# Patient Record
Sex: Male | Born: 1985 | Race: Black or African American | Hispanic: No | Marital: Married | Smoking: Never smoker
Health system: Southern US, Community
[De-identification: ages and names within clinical notes are randomized; demographics above are authoritative.]

## PROBLEM LIST (undated history)

## (undated) DIAGNOSIS — T4145XA Adverse effect of unspecified anesthetic, initial encounter: Secondary | ICD-10-CM

## (undated) DIAGNOSIS — G43909 Migraine, unspecified, not intractable, without status migrainosus: Secondary | ICD-10-CM

## (undated) DIAGNOSIS — F419 Anxiety disorder, unspecified: Secondary | ICD-10-CM

## (undated) DIAGNOSIS — I10 Essential (primary) hypertension: Secondary | ICD-10-CM

## (undated) DIAGNOSIS — T8859XA Other complications of anesthesia, initial encounter: Secondary | ICD-10-CM

## (undated) DIAGNOSIS — R569 Unspecified convulsions: Secondary | ICD-10-CM

## (undated) HISTORY — PX: KNEE SURGERY: SHX244

## (undated) HISTORY — DX: Anxiety disorder, unspecified: F41.9

## (undated) HISTORY — DX: Migraine, unspecified, not intractable, without status migrainosus: G43.909

---

## 2012-04-23 ENCOUNTER — Ambulatory Visit (INDEPENDENT_AMBULATORY_CARE_PROVIDER_SITE_OTHER): Payer: BC Managed Care – PPO | Admitting: Emergency Medicine

## 2012-04-23 ENCOUNTER — Ambulatory Visit: Payer: BC Managed Care – PPO

## 2012-04-23 VITALS — BP 136/87 | HR 106 | Temp 99.3°F | Resp 16 | Ht 71.5 in | Wt 272.0 lb

## 2012-04-23 DIAGNOSIS — M25579 Pain in unspecified ankle and joints of unspecified foot: Secondary | ICD-10-CM

## 2012-04-23 DIAGNOSIS — S93409A Sprain of unspecified ligament of unspecified ankle, initial encounter: Secondary | ICD-10-CM

## 2012-04-23 MED ORDER — HYDROCODONE-ACETAMINOPHEN 5-325 MG PO TABS: 1.0000 | ORAL_TABLET | ORAL | Status: DC | PRN

## 2012-04-23 NOTE — Patient Instructions (Signed)
Ankle Sprain  An ankle sprain is an injury to the strong, fibrous tissues (ligaments) that hold the bones of your ankle joint together.   CAUSES  An ankle sprain is usually caused by a fall or by twisting your ankle. Ankle sprains most commonly occur when you step on the outer edge of your foot, and your ankle turns inward. People who participate in sports are more prone to these types of injuries.   SYMPTOMS    Pain in your ankle. The pain may be present at rest or only when you are trying to stand or walk.   Swelling.   Bruising. Bruising may develop immediately or within 1 to 2 days after your injury.   Difficulty standing or walking, particularly when turning corners or changing directions.  DIAGNOSIS   Your caregiver will ask you details about your injury and perform a physical exam of your ankle to determine if you have an ankle sprain. During the physical exam, your caregiver will press on and apply pressure to specific areas of your foot and ankle. Your caregiver will try to move your ankle in certain ways. An X-ray exam may be done to be sure a bone was not broken or a ligament did not separate from one of the bones in your ankle (avulsion fracture).   TREATMENT   Certain types of braces can help stabilize your ankle. Your caregiver can make a recommendation for this. Your caregiver may recommend the use of medicine for pain. If your sprain is severe, your caregiver may refer you to a surgeon who helps to restore function to parts of your skeletal system (orthopedist) or a physical therapist.  HOME CARE INSTRUCTIONS    Apply ice to your injury for 1 to 2 days or as directed by your caregiver. Applying ice helps to reduce inflammation and pain.   Put ice in a plastic bag.   Place a towel between your skin and the bag.   Leave the ice on for 15 to 20 minutes at a time, every 2 hours while you are awake.   Only take over-the-counter or prescription medicines for pain, discomfort, or fever as directed  by your caregiver.   Keep your injured leg elevated, when possible, to lessen swelling.   If your caregiver recommends crutches, use them as instructed. Gradually put weight on the affected ankle. Continue to use crutches or a cane until you can walk without feeling pain in your ankle.   If you have a plaster splint, wear the splint as directed by your caregiver. Do not rest it on anything harder than a pillow for the first 24 hours. Do not put weight on it. Do not get it wet. You may take it off to take a shower or bath.   You may have been given an elastic bandage to wear around your ankle to provide support. If the elastic bandage is too tight (you have numbness or tingling in your foot or your foot becomes cold and blue), adjust the bandage to make it comfortable.   If you have an air splint, you may blow more air into it or let air out to make it more comfortable. You may take your splint off at night and before taking a shower or bath.   Wiggle your toes in the splint several times per day to decrease swelling.  SEEK MEDICAL CARE IF:    You have an increase in bruising, swelling, or pain.   Your toes feel extremely cold   or you lose feeling in your foot.   Your pain is not relieved with medicine.  SEEK IMMEDIATE MEDICAL CARE IF:   Your toes are numb or blue.   You have severe pain.  MAKE SURE YOU:    Understand these instructions.   Will watch your condition.   Will get help right away if you are not doing well or get worse.  Document Released: 02/24/2005 Document Revised: 05/19/2011 Document Reviewed: 03/08/2011  [employer] Patient Information 2013 [employer].

## 2012-04-23 NOTE — Progress Notes (Signed)
[facility] [address] [place] [address] [phone]  Date:  04/23/2012   Name:  Peter Barr   DOB:  08/05/1985   MRN:  [id number]  PCP:  No primary provider on file.    Chief Complaint: Ankle Injury and Ankle Pain   History of Present Illness:  Peter Barr is a 27 y.o. very pleasant male patient who presents with the following:  Injured when slipped on ice and twisted his left ankle.  Unable to bear weight.  Moderate swelling and tenderness mid foot.  No other complaint.  There is no problem list on file for this patient.   History reviewed. No pertinent past medical history.  History reviewed. No pertinent past surgical history.  History  Substance Use Topics  . Smoking status: Never Smoker   . Smokeless tobacco: Not on file  . Alcohol Use: No    Family History  Problem Relation Age of Onset  . Hypertension Father   . Diabetes Maternal Grandmother   . Stroke Maternal Grandmother     No Known Allergies  Medication list has been reviewed and updated.  No current outpatient prescriptions on file prior to visit.   No current facility-administered medications on file prior to visit.    Review of Systems:  As per HPI, otherwise negative.    Physical Examination: Filed Vitals:   04/23/12 1855  BP: 136/87  Pulse: 106  Temp: 99.3 F (37.4 C)  Resp: 16   Filed Vitals:   04/23/12 1855  Height: 5' 11.5" (1.816 m)  Weight: 272 lb (123.378 kg)   Body mass index is 37.41 kg/(m^2). Ideal Body Weight: Weight in (lb) to have BMI = 25: 181.4   GEN: WDWN, NAD, Non-toxic, Alert & Oriented x 3 HEENT: Atraumatic, Normocephalic.  Ears and Nose: No external deformity. EXTR: No clubbing/cyanosis/edema NEURO: Normal gait.  PSYCH: Normally interactive. Conversant. Not depressed or anxious appearing.  Calm demeanor.  Left ankle:  Tender mid foot.  No ecchymosis or deformity.  Moderate swelling  Assessment and Plan: Sprain  ankle Crutches Air cast vicodin   Carmelina Dane, MD  [facility] reading (PRIMARY) by  Dr. Dareen Piano.  Ankle;  No bony injury.  [facility] reading (PRIMARY) by  Dr. Dareen Piano.  Foot.  No bony injury.

## 2012-08-08 ENCOUNTER — Emergency Department (HOSPITAL_COMMUNITY)
Admission: EM | Admit: 2012-08-08 | Discharge: 2012-08-08 | Disposition: A | Discharge status: Home / Self Care | Payer: Self-pay | Attending: Emergency Medicine | Admitting: Emergency Medicine

## 2012-08-08 ENCOUNTER — Encounter (HOSPITAL_COMMUNITY): Payer: Self-pay | Admitting: Emergency Medicine

## 2012-08-08 ENCOUNTER — Ambulatory Visit: Payer: BC Managed Care – PPO

## 2012-08-08 DIAGNOSIS — M25579 Pain in unspecified ankle and joints of unspecified foot: Secondary | ICD-10-CM | POA: Insufficient documentation

## 2012-08-08 DIAGNOSIS — M25572 Pain in left ankle and joints of left foot: Secondary | ICD-10-CM

## 2012-08-08 DIAGNOSIS — G8929 Other chronic pain: Secondary | ICD-10-CM | POA: Insufficient documentation

## 2012-08-08 MED ORDER — MELOXICAM 7.5 MG PO TABS: 15.0000 mg | ORAL_TABLET | Freq: Every day | ORAL | Status: DC

## 2012-08-08 MED ORDER — MELOXICAM 15 MG PO TABS: 15.0000 mg | ORAL_TABLET | Freq: Every day | ORAL | Status: DC

## 2012-08-08 NOTE — ED Notes (Addendum)
C/o intermittent L ankle pain and swelling since tripping and falling in January.  Reports negative x-rays at that time but has continued to have pain.  Denies pain at present.  Taking ibuprofen with relief.

## 2012-08-08 NOTE — ED Provider Notes (Signed)
Medical screening examination/treatment/procedure(s) were performed by non-physician practitioner and as supervising physician I was immediately available for consultation/collaboration.   Ramez Arrona Y. Londyn Hotard, MD 08/08/12 1958 

## 2012-08-08 NOTE — ED Provider Notes (Signed)
History     CSN: [id number]  Arrival date & time 08/08/12  1731   First MD Initiated Contact with Patient 08/08/12 1805      Chief Complaint  Patient presents with  . Ankle Pain    (Consider location/radiation/quality/duration/timing/severity/associated sxs/prior treatment) HPI Comments: Patient presents with persistent left ankle pain that has been nonradiating, intermittent for the past 4 months since an injury sustained when he twisted his ankle while walking on ice. Patient was seen initially because he could not ambulate. X-rays were performed of his foot and ankle which were negative. Patient states that the pain is improved however he has significant pain especially in the mornings. He works for the [employer] office and is on his feet for multiple hours in a day. He's been using ibuprofen with some relief. He's been ambulatory. He denies numbness, weakness, or tingling. He denies coldness to the skin. He has a history of a meniscal tear but does not have a orthopedic surgeon in [place]. Patient noted worsening pain and swelling yesterday without new injury. Onset of symptoms acute. Course is intermittent.  Patient is a 27 y.o. male presenting with ankle pain. The history is provided by the patient.  Ankle Pain Associated symptoms: no back pain and no neck pain     History reviewed. No pertinent past medical history.  Past Surgical History  Procedure Laterality Date  . Knee surgery      Family History  Problem Relation Age of Onset  . Hypertension Father   . Diabetes Maternal Grandmother   . Stroke Maternal Grandmother     History  Substance Use Topics  . Smoking status: Never Smoker   . Smokeless tobacco: Not on file  . Alcohol Use: Yes      Review of Systems  Constitutional: Negative for activity change.  HENT: Negative for neck pain.   Musculoskeletal: Positive for arthralgias. Negative for back pain, joint swelling and gait problem.  Skin: Negative for  wound.  Neurological: Negative for weakness and numbness.    Allergies  Review of patient's allergies indicates no known allergies.  Home Medications   Current Outpatient Rx  Name  Route  Sig  Dispense  Refill  . ibuprofen (ADVIL,MOTRIN) 800 MG tablet   Oral   Take 800 mg by mouth every 8 (eight) hours as needed for pain.         . Multiple Vitamins-Minerals (MULTIVITAMIN PO)   Oral   Take 1 tablet by mouth daily.         . meloxicam (MOBIC) 15 MG tablet   Oral   Take 1 tablet (15 mg total) by mouth daily.   14 tablet   0     BP 118/67  Pulse 87  Temp(Src) 98.6 F (37 C) (Oral)  Resp 16  SpO2 98%  Physical Exam  Vitals reviewed. Constitutional: He appears well-developed and well-nourished.  HENT:  Head: Normocephalic and atraumatic.  Eyes: Conjunctivae are normal.  Neck: Normal range of motion. Neck supple.  Cardiovascular:  Pulses:      Dorsalis pedis pulses are 2+ on the right side, and 2+ on the left side.       Posterior tibial pulses are 2+ on the right side, and 2+ on the left side.  Pulmonary/Chest: No respiratory distress.  Musculoskeletal: He exhibits tenderness. He exhibits no edema.       Left hip: Normal.       Left knee: Normal.       Left ankle:  He exhibits normal range of motion and no swelling. Tenderness. Lateral malleolus tenderness found.       Left lower leg: Normal.       Left foot: He exhibits tenderness and bony tenderness. He exhibits normal range of motion and no swelling.       Feet:  He denies pain with palpation over the fibular head of the affected side. She denies pain in the hip of the affected side.  Neurological: He is alert.  Distal motor, sensation, and vascular intact.  Skin: Skin is warm and dry.  Psychiatric: He has a normal mood and affect.    ED Course  Procedures (including critical care time)  Labs Reviewed - No data to display No results found.   1. Ankle pain, left    7:06 PM Patient seen and  examined.    Vital signs reviewed and are as follows: Filed Vitals:   08/08/12 1850  BP: 118/67  Pulse: 87  Temp:   Resp: 16   Will rx meloxicam and refer to ortho for further evaluation.    Pt defers ASO.     MDM  Chronic ankle pain since injury 02/14. Ambulatory. Pain improved with NSAIDs. Ortho referral with [facility], NSAID indicated. LE is neurovascularly intact.         Renne Crigler, PA-C 08/08/12 1912

## 2012-12-12 ENCOUNTER — Encounter (HOSPITAL_COMMUNITY): Payer: Self-pay | Admitting: *Deleted

## 2012-12-12 ENCOUNTER — Emergency Department (HOSPITAL_COMMUNITY)
Admission: EM | Admit: 2012-12-12 | Discharge: 2012-12-12 | Disposition: A | Discharge status: Home / Self Care | Payer: Medicaid Other | Attending: Emergency Medicine | Admitting: Emergency Medicine

## 2012-12-12 DIAGNOSIS — Z87828 Personal history of other (healed) physical injury and trauma: Secondary | ICD-10-CM | POA: Insufficient documentation

## 2012-12-12 DIAGNOSIS — G8929 Other chronic pain: Secondary | ICD-10-CM

## 2012-12-12 DIAGNOSIS — M25579 Pain in unspecified ankle and joints of unspecified foot: Secondary | ICD-10-CM | POA: Insufficient documentation

## 2012-12-12 DIAGNOSIS — Z9889 Other specified postprocedural states: Secondary | ICD-10-CM | POA: Insufficient documentation

## 2012-12-12 DIAGNOSIS — M25559 Pain in unspecified hip: Secondary | ICD-10-CM | POA: Insufficient documentation

## 2012-12-12 MED ORDER — HYDROCODONE-ACETAMINOPHEN 5-325 MG PO TABS: 2.0000 | ORAL_TABLET | ORAL | Status: DC | PRN

## 2012-12-12 MED ORDER — NAPROXEN 500 MG PO TABS: 500.0000 mg | ORAL_TABLET | Freq: Two times a day (BID) | ORAL | Status: DC

## 2012-12-12 NOTE — ED Notes (Signed)
Reports hx of twisting left ankle in past and now having pain again, denies new injury to it. Ambulatory at triage. Also having recent right hip pain.

## 2012-12-12 NOTE — Progress Notes (Signed)
Orthopedic Tech Progress Note Patient Details:  Peter Barr 26-Jul-1985 [id number]  Ortho Devices Type of Ortho Device: ASO Ortho Device/Splint Interventions: Application   Cammer, Mickie Bail 12/12/2012, 12:43 PM

## 2012-12-12 NOTE — ED Notes (Signed)
Pms post splint application.

## 2012-12-12 NOTE — ED Provider Notes (Signed)
Medical screening examination/treatment/procedure(s) were performed by non-physician practitioner and as supervising physician I was immediately available for consultation/collaboration.  Flint Melter, MD 12/12/12 [phone]

## 2012-12-12 NOTE — ED Provider Notes (Signed)
CSN: [id number]     Arrival date & time 12/12/12  1106 History  This chart was scribed for Fayrene Helper, PA working with Flint Melter, MD by Quintella Reichert, ED Scribe. This patient was seen in room TR06C/TR06C and the patient's care was started at 12:03 PM.  Chief Complaint  Patient presents with  . Ankle Pain    The history is provided by the patient. No language interpreter was used.    HPI Comments: Peter Barr is a 27 y.o. male who presents to the Emergency Department complaining of left ankle pain.  Pt states that he twisted the ankle in February and was diagnosed with a minor sprain based on x-ray.  He states his pain never fully resolved and was aggravated in the past week.  Pain is moderate and aching.  It does not radiate.  It is worse in the morning and after spending a long time on his feet.  He denies numbness, swelling or rash.  He has attempted to treat pain with ice and ibuprofen, without relief.  He only elevates the ankle when sleeping.  Pt also mentions that yesterday he noticed a "pulling" sensation to the right hip when walking up steps.  This has since improved significantly.  He denies any specific traumas.   History reviewed. No pertinent past medical history.   Past Surgical History  Procedure Laterality Date  . Knee surgery      Family History  Problem Relation Age of Onset  . Hypertension Father   . Diabetes Maternal Grandmother   . Stroke Maternal Grandmother     History  Substance Use Topics  . Smoking status: Never Smoker   . Smokeless tobacco: Not on file  . Alcohol Use: Yes     Review of Systems  Musculoskeletal: Positive for arthralgias.     Allergies  Review of patient's allergies indicates no known allergies.  Home Medications   Current Outpatient Rx  Name  Route  Sig  Dispense  Refill  . acetaminophen (TYLENOL) 500 MG tablet   Oral   Take 1,000 mg by mouth every 6 (six) hours as needed for pain.         Marland Kitchen ibuprofen  (ADVIL,MOTRIN) 200 MG tablet   Oral   Take 800 mg by mouth every 6 (six) hours as needed for pain.         . Multiple Vitamins-Minerals (MULTIVITAMIN PO)   Oral   Take 1 tablet by mouth daily.          BP 137/82  Pulse 82  Temp(Src) 98.3 F (36.8 C) (Oral)  Resp 18  Wt 278 lb 4.8 oz (126.236 kg)  BMI 38.28 kg/m2  SpO2 96%  Physical Exam  Nursing note and vitals reviewed. Constitutional: He is oriented to person, place, and time. He appears well-developed and well-nourished. No distress.  HENT:  Head: Normocephalic and atraumatic.  Eyes: EOM are normal.  Neck: Neck supple. No tracheal deviation present.  Cardiovascular: Normal rate and intact distal pulses.   Pulmonary/Chest: Effort normal. No respiratory distress.  Musculoskeletal: Normal range of motion.  Normal dorsiflexion and plantarflexion to left ankle Tenderness with foot eversion and inversion Tenderness to medial malleolar region of left ankle.  Mild swelling noted. No obvious deformity, erythema or ash. Normal flexion and extension to right hip.  Neurological: He is alert and oriented to person, place, and time.  Skin: Skin is warm and dry.  Psychiatric: He has a normal mood and affect. His behavior  is normal.    ED Course  Procedures (including critical care time)  DIAGNOSTIC STUDIES: Oxygen Saturation is 96% on room air, normal by my interpretation.    COORDINATION OF CARE: 12:08 PM-Doubt septic arthritis, fracture, dislocation or gout.  Suspect symptoms are more likely related to arthritic changes.  Discussed treatment plan which includes ASO brace, RICE therapy, referral to orthopedist, and resource guide to find a PCP.  Pt expressed understanding and agreed with plan.   Labs Review Labs Reviewed - No data to display  Imaging Review No results found.  MDM   1. Chronic pain of left ankle    BP 137/82  Pulse 82  Temp(Src) 98.3 F (36.8 C) (Oral)  Resp 18  Wt 278 lb 4.8 oz (126.236 kg)   BMI 38.28 kg/m2  SpO2 96%   I personally performed the services described in this documentation, which was scribed in my presence. The recorded information has been reviewed and is accurate.     Fayrene Helper, PA-C 12/12/12 1217

## 2013-04-16 ENCOUNTER — Inpatient Hospital Stay (HOSPITAL_COMMUNITY)
Admission: EM | Admit: 2013-04-16 | Discharge: 2013-04-22 | DRG: 101 | Disposition: A | Discharge status: Home / Self Care | Payer: 59 | Attending: Internal Medicine | Admitting: Internal Medicine

## 2013-04-16 ENCOUNTER — Emergency Department (HOSPITAL_COMMUNITY): Payer: 59

## 2013-04-16 ENCOUNTER — Encounter (HOSPITAL_COMMUNITY): Payer: Self-pay | Admitting: Emergency Medicine

## 2013-04-16 DIAGNOSIS — M6282 Rhabdomyolysis: Secondary | ICD-10-CM | POA: Diagnosis present

## 2013-04-16 DIAGNOSIS — S01502A Unspecified open wound of oral cavity, initial encounter: Secondary | ICD-10-CM | POA: Diagnosis present

## 2013-04-16 DIAGNOSIS — E860 Dehydration: Secondary | ICD-10-CM | POA: Diagnosis present

## 2013-04-16 DIAGNOSIS — M542 Cervicalgia: Secondary | ICD-10-CM | POA: Diagnosis present

## 2013-04-16 DIAGNOSIS — R569 Unspecified convulsions: Secondary | ICD-10-CM | POA: Diagnosis present

## 2013-04-16 DIAGNOSIS — M549 Dorsalgia, unspecified: Secondary | ICD-10-CM | POA: Diagnosis present

## 2013-04-16 DIAGNOSIS — Z8249 Family history of ischemic heart disease and other diseases of the circulatory system: Secondary | ICD-10-CM

## 2013-04-16 DIAGNOSIS — Z833 Family history of diabetes mellitus: Secondary | ICD-10-CM | POA: Diagnosis not present

## 2013-04-16 DIAGNOSIS — Z823 Family history of stroke: Secondary | ICD-10-CM

## 2013-04-16 DIAGNOSIS — I10 Essential (primary) hypertension: Secondary | ICD-10-CM | POA: Diagnosis present

## 2013-04-16 DIAGNOSIS — G40909 Epilepsy, unspecified, not intractable, without status epilepticus: Secondary | ICD-10-CM | POA: Diagnosis present

## 2013-04-16 DIAGNOSIS — S93409A Sprain of unspecified ligament of unspecified ankle, initial encounter: Secondary | ICD-10-CM | POA: Diagnosis present

## 2013-04-16 DIAGNOSIS — Y92009 Unspecified place in unspecified non-institutional (private) residence as the place of occurrence of the external cause: Secondary | ICD-10-CM

## 2013-04-16 DIAGNOSIS — W19XXXA Unspecified fall, initial encounter: Secondary | ICD-10-CM | POA: Diagnosis present

## 2013-04-16 HISTORY — DX: Essential (primary) hypertension: I10

## 2013-04-16 LAB — CBC
HEMATOCRIT: 44.8 % (ref 39.0–52.0)
Hemoglobin: 15.4 g/dL (ref 13.0–17.0)
MCH: 27.7 pg (ref 26.0–34.0)
MCHC: 34.4 g/dL (ref 30.0–36.0)
MCV: 80.7 fL (ref 78.0–100.0)
Platelets: 230 10*3/uL (ref 150–400)
RBC: 5.55 MIL/uL (ref 4.22–5.81)
RDW: 13.5 % (ref 11.5–15.5)
WBC: 12.2 10*3/uL — ABNORMAL HIGH (ref 4.0–10.5)

## 2013-04-16 LAB — BASIC METABOLIC PANEL
BUN: 15 mg/dL (ref 6–23)
CO2: 23 meq/L (ref 19–32)
Calcium: 9.4 mg/dL (ref 8.4–10.5)
Chloride: 100 mEq/L (ref 96–112)
Creatinine, Ser: 1.06 mg/dL (ref 0.50–1.35)
GFR calc Af Amer: >90 mL/min (ref >90)
Glucose, Bld: 115 mg/dL — ABNORMAL HIGH (ref 70–99)
POTASSIUM: 3.9 meq/L (ref 3.7–5.3)
SODIUM: 138 meq/L (ref 137–147)

## 2013-04-16 LAB — URINALYSIS, ROUTINE W REFLEX MICROSCOPIC
BILIRUBIN URINE: NEGATIVE
Glucose, UA: NEGATIVE mg/dL
Hgb urine dipstick: NEGATIVE
Ketones, ur: NEGATIVE mg/dL
Leukocytes, UA: NEGATIVE
NITRITE: NEGATIVE
PROTEIN: NEGATIVE mg/dL
Specific Gravity, Urine: 1.022 (ref 1.005–1.030)
UROBILINOGEN UA: 0.2 mg/dL (ref 0.0–1.0)
pH: 5.5 (ref 5.0–8.0)

## 2013-04-16 LAB — MRSA PCR SCREENING: MRSA by PCR: NEGATIVE

## 2013-04-16 LAB — GLUCOSE, CAPILLARY: GLUCOSE-CAPILLARY: 116 mg/dL — AB (ref 70–99)

## 2013-04-16 MED ORDER — PHENYTOIN SODIUM EXTENDED 100 MG PO CAPS
100.0000 mg | ORAL_CAPSULE | ORAL | Status: DC
  Administered 2013-04-16 – 2013-04-20 (×11): 100 mg via ORAL
  Filled 2013-04-16 (×14): qty 1

## 2013-04-16 MED ORDER — ACETAMINOPHEN 650 MG RE SUPP: 650.0000 mg | Freq: Four times a day (QID) | RECTAL | Status: DC | PRN

## 2013-04-16 MED ORDER — PHENYTOIN SODIUM EXTENDED 100 MG PO CAPS
100.0000 mg | ORAL_CAPSULE | Freq: Three times a day (TID) | ORAL | Status: DC
  Filled 2013-04-16: qty 1

## 2013-04-16 MED ORDER — ONDANSETRON HCL 4 MG/2ML IJ SOLN: 4.0000 mg | Freq: Four times a day (QID) | INTRAMUSCULAR | Status: DC | PRN

## 2013-04-16 MED ORDER — SODIUM CHLORIDE 0.9 % IV SOLN
INTRAVENOUS | Status: DC
  Administered 2013-04-16 – 2013-04-17 (×2): via INTRAVENOUS
  Administered 2013-04-18: 1000 mL via INTRAVENOUS
  Administered 2013-04-19: 18:00:00 via INTRAVENOUS

## 2013-04-16 MED ORDER — ACETAMINOPHEN 325 MG PO TABS
650.0000 mg | ORAL_TABLET | Freq: Four times a day (QID) | ORAL | Status: DC | PRN
  Administered 2013-04-20: 650 mg via ORAL
  Filled 2013-04-16: qty 2

## 2013-04-16 MED ORDER — LORAZEPAM 2 MG/ML IJ SOLN
INTRAMUSCULAR | Status: AC
  Filled 2013-04-16: qty 1

## 2013-04-16 MED ORDER — HYDROCODONE-ACETAMINOPHEN 5-325 MG PO TABS
1.0000 | ORAL_TABLET | ORAL | Status: DC | PRN
  Administered 2013-04-16 – 2013-04-17 (×3): 1 via ORAL
  Administered 2013-04-17 – 2013-04-20 (×6): 2 via ORAL
  Filled 2013-04-16: qty 2
  Filled 2013-04-16: qty 1
  Filled 2013-04-16 (×2): qty 2
  Filled 2013-04-16 (×2): qty 1
  Filled 2013-04-16 (×4): qty 2

## 2013-04-16 MED ORDER — SODIUM CHLORIDE 0.9 % IV SOLN: INTRAVENOUS | Status: DC

## 2013-04-16 MED ORDER — ONDANSETRON HCL 4 MG PO TABS: 4.0000 mg | ORAL_TABLET | Freq: Four times a day (QID) | ORAL | Status: DC | PRN

## 2013-04-16 MED ORDER — SODIUM CHLORIDE 0.9 % IV SOLN
10.0000 mg/kg | Freq: Once | INTRAVENOUS | Status: AC
  Administered 2013-04-16: 1270 mg via INTRAVENOUS
  Filled 2013-04-16: qty 25.4

## 2013-04-16 MED ORDER — HYDROMORPHONE HCL PF 1 MG/ML IJ SOLN
1.0000 mg | INTRAMUSCULAR | Status: DC | PRN
  Administered 2013-04-16 – 2013-04-17 (×2): 1 mg via INTRAVENOUS
  Filled 2013-04-16 (×2): qty 1

## 2013-04-16 MED ORDER — LORAZEPAM 2 MG/ML IJ SOLN: 2.0000 mg | INTRAMUSCULAR | Status: DC | PRN

## 2013-04-16 NOTE — ED Provider Notes (Signed)
CSN: [id number]     Arrival date & time 04/16/13  1056 History   First MD Initiated Contact with Patient 04/16/13 1139     Chief Complaint  Patient presents with  . Seizures   (Consider location/radiation/quality/duration/timing/severity/associated sxs/prior Treatment) Patient is a 28 y.o. male presenting with seizures. The history is provided by the patient, the spouse and the EMS personnel.  Seizures  He was at home, talking to his wife in the kitchen when he suddenly tensed up and fell to the right side. He then began shaking and hitting his head on a gate. He did this for 3-5 minutes. He, then stop shaking and was not responsive and having sonorous respirations. EMS was called and arrived and transferred him here. During transport he was postictal. On arrival here he had begun to wake up.  His wife arrived after he had been here about one hour. She states that he had been having ankle pain and using ibuprofen and tramadol, for it. There are no other recent illnesses or abnormalities, known.  Level V caveat- confusion  History reviewed. No pertinent past medical history. Past Surgical History  Procedure Laterality Date  . Knee surgery     Family History  Problem Relation Age of Onset  . Hypertension Father   . Diabetes Maternal Grandmother   . Stroke Maternal Grandmother    History  Substance Use Topics  . Smoking status: Never Smoker   . Smokeless tobacco: Not on file  . Alcohol Use: Yes    Review of Systems  Unable to perform ROS Neurological: Positive for seizures.    Allergies  Review of patient's allergies indicates no known allergies.  Home Medications   Current Outpatient Rx  Name  Route  Sig  Dispense  Refill  . ibuprofen (ADVIL,MOTRIN) 800 MG tablet   Oral   Take 800 mg by mouth every 8 (eight) hours as needed (pain).         Marland Kitchen. OVER THE COUNTER MEDICATION   Oral   Take 2 tablets by mouth daily. Joint Gummies         . traMADol (ULTRAM) 50 MG  tablet   Oral   Take 50 mg by mouth every 12 (twelve) hours as needed for moderate pain.          BP 126/75  Pulse 106  Temp(Src) 98.4 F (36.9 C) (Oral)  Resp 33  Ht 5\' 11"  (1.803 m)  Wt 280 lb (127.007 kg)  BMI 39.07 kg/m2  SpO2 98% Physical Exam  Nursing note and vitals reviewed. Constitutional: He appears well-developed and well-nourished.  HENT:  Head: Normocephalic.  Right Ear: External ear normal.  Left Ear: External ear normal.  Abrasion anterior tongue  Eyes: Conjunctivae and EOM are normal. Pupils are equal, round, and reactive to light.  Neck: Normal range of motion and phonation normal. Neck supple.  Cardiovascular: Normal rate, regular rhythm, normal heart sounds and intact distal pulses.   Pulmonary/Chest: Effort normal and breath sounds normal. He exhibits no bony tenderness.  Abdominal: Soft. Normal appearance. There is no tenderness.  Musculoskeletal: Normal range of motion.  Diffuse posterior neck tenderness. No palpable spine step off. No limitation of motion of arms or legs, bilaterally.  Neurological: He is alert. No cranial nerve deficit or sensory deficit. He exhibits normal muscle tone. Coordination normal.  Oriented only to person  Skin: Skin is warm, dry and intact.  Psychiatric: He has a normal mood and affect. His behavior is normal.  ED Course  Procedures (including critical care time)  Medications  LORazepam (ATIVAN) 2 MG/ML injection (not administered)  fosPHENYtoin (CEREBYX) 1,270 mg PE in sodium chloride 0.9 % 50 mL IVPB (0 mg PE/kg  127 kg Intravenous Stopped 04/16/13 1503)    Patient Vitals for the past 24 hrs:  BP Temp Temp src Pulse Resp SpO2 Height Weight  04/16/13 1500 126/75 mmHg - - 106 33 98 % - -  04/16/13 1446 125/74 mmHg - - 110 37 99 % - -  04/16/13 1430 143/64 mmHg - - 113 30 96 % - -  04/16/13 1415 143/64 mmHg - - 117 28 97 % - -  04/16/13 1400 160/77 mmHg - - 113 26 97 % - 280 lb (127.007 kg)  04/16/13 1349 161/96  mmHg - - 101 34 88 % - -  04/16/13 1330 138/78 mmHg - - 81 23 97 % - -  04/16/13 1300 141/85 mmHg - - 82 22 98 % - -  04/16/13 1230 154/84 mmHg - - 78 21 100 % - -  04/16/13 1223 143/81 mmHg - - 83 21 100 % - -  04/16/13 1200 146/91 mmHg - - 76 18 100 % - -  04/16/13 1112 145/97 mmHg - - 93 20 100 % - -  04/16/13 1100 158/73 mmHg 98.4 F (36.9 C) Oral 95 - 97 % 5\' 11"  (1.803 m) -  04/16/13 1057 150/73 mmHg - - 96 18 96 % - -  04/16/13 1055 - - - - - 99 % - -    1345 PM Reevaluation with update and discussion. After initial assessment and treatment, an updated evaluation reveals shortly after returning from CT imaging, he had a generalized tonic-clonic seizure followed by post-ictal state. Cerebyx, ordered for recurrent seizure. Gay Moncivais L   3:07 PM Reevaluation with update and discussion. After initial assessment and treatment, an updated evaluation reveals he is beginning to wake up. It is still sedated. His wife has determined that he has only used 5 out of his prescription of 60 tramadol pills, and feels like he has not had any within the last month. Kimara Bencomo L   15:12- I discussed the case with the neuro-hospitalist- will evaluate the patient and see him as a Research scientist (medical)  3:14 PM-Consult complete with Hospitalist. Patient case explained and discussed. She agrees to admit patient for further evaluation and treatment. Call ended at 1520  16:30- patient is now alert and lucid and able to operate with evaluation of neck. Cervical collar removed. This did not aggravate his discomfort. He complains of pain, bilateral neck in the areas of the sternocleidomastoid muscles. He has mild pain posterior in the neck that does not change with attempts at movement, including lateral bending and rotation. His neck motion is somewhat limited, but that is not consistent; i.e., he moves more at some times and less at others.  Cervical collar left off. There is no indication for further imaging of the  neck at this time.  CRITICAL CARE Performed by: Flint Melter Total critical care time:50 minutes Critical care time was exclusive of separately billable procedures and treating other patients. Critical care was necessary to treat or prevent imminent or life-threatening deterioration. Critical care was time spent personally by me on the following activities: development of treatment plan with patient and/or surrogate as well as nursing, discussions with consultants, evaluation of patient's response to treatment, examination of patient, obtaining history from patient or surrogate, ordering and performing treatments and interventions, ordering and  review of laboratory studies, ordering and review of radiographic studies, pulse oximetry and re-evaluation of patient's condition.  Labs Review Labs Reviewed  BASIC METABOLIC PANEL - Abnormal; Notable for the following:    Glucose, Bld 115 (*)    All other components within normal limits  CBC - Abnormal; Notable for the following:    WBC 12.2 (*)    All other components within normal limits  GLUCOSE, CAPILLARY - Abnormal; Notable for the following:    Glucose-Capillary 116 (*)    All other components within normal limits   Imaging Review Ct Head Wo Contrast  04/16/2013   CLINICAL DATA:  Fall status post seizure  EXAM: CT HEAD WITHOUT CONTRAST  CT CERVICAL SPINE WITHOUT CONTRAST  TECHNIQUE: Multidetector CT imaging of the head and cervical spine was performed following the standard protocol without intravenous contrast. Multiplanar CT image reconstructions of the cervical spine were also generated.  COMPARISON:  None.  FINDINGS: CT HEAD FINDINGS  No acute intracranial abnormality. Specifically, no hemorrhage, hydrocephalus, mass lesion, acute infarction, or significant intracranial injury. No acute calvarial abnormality. Visualized paranasal sinuses and mastoid air cells are patent.  CT CERVICAL SPINE FINDINGS  There is straightening with minimal  reversal of the normal cervical lordosis. This may represent collar placement, muscle spasm and/or positioning. Disk spaces are normal and there is no significant disk degeneration. No spondylosis is identified and there is no spinal or foraminal stenosis. There is no prevertebral soft tissue thickening.  No fracture is identified in the cervical spine. No mass lesion is present.  IMPRESSION: 1. No evidence of focal acute intracranial abnormalities 2. Mild reversal of the normal cervical lordosis may reflect muscle spasm, collar placement, or positioning otherwise unremarkable CT of the cervical spine.   Electronically Signed   By: Salome Holmes M.D.   On: 04/16/2013 12:33   Ct Cervical Spine Wo Contrast  04/16/2013   CLINICAL DATA:  Fall status post seizure  EXAM: CT HEAD WITHOUT CONTRAST  CT CERVICAL SPINE WITHOUT CONTRAST  TECHNIQUE: Multidetector CT imaging of the head and cervical spine was performed following the standard protocol without intravenous contrast. Multiplanar CT image reconstructions of the cervical spine were also generated.  COMPARISON:  None.  FINDINGS: CT HEAD FINDINGS  No acute intracranial abnormality. Specifically, no hemorrhage, hydrocephalus, mass lesion, acute infarction, or significant intracranial injury. No acute calvarial abnormality. Visualized paranasal sinuses and mastoid air cells are patent.  CT CERVICAL SPINE FINDINGS  There is straightening with minimal reversal of the normal cervical lordosis. This may represent collar placement, muscle spasm and/or positioning. Disk spaces are normal and there is no significant disk degeneration. No spondylosis is identified and there is no spinal or foraminal stenosis. There is no prevertebral soft tissue thickening.  No fracture is identified in the cervical spine. No mass lesion is present.  IMPRESSION: 1. No evidence of focal acute intracranial abnormalities 2. Mild reversal of the normal cervical lordosis may reflect muscle spasm,  collar placement, or positioning otherwise unremarkable CT of the cervical spine.   Electronically Signed   By: Salome Holmes M.D.   On: 04/16/2013 12:33   Dg Chest Port 1 View  04/16/2013   CLINICAL DATA:  Seizure.  Chest pain.  EXAM: PORTABLE CHEST - 1 VIEW  COMPARISON:  None.  FINDINGS: Portable view of the chest demonstrates elevation of the right hemidiaphragm. Probable atelectasis at the right lung base. Otherwise, the lungs are clear. Heart and mediastinum are within normal limits. Trachea is midline.  IMPRESSION: Elevation of the right hemidiaphragm with volume loss. No focal airspace disease.   Electronically Signed   By: Richarda Overlie M.D.   On: 04/16/2013 12:17    EKG Interpretation    Date/Time:  Saturday April 16 2013 10:59:00 EST Ventricular Rate:  96 PR Interval:  147 QRS Duration: 93 QT Interval:  359 QTC Calculation: 454 R Axis:   25 Text Interpretation:  Sinus rhythm No old tracing to compare Confirmed by [facility]  MD, Bethany Cumming ([id number]) on 04/16/2013 2:06:49 PM            MDM   1. Seizure disorder   2. Neck pain      New-onset seizure disorder, with 2 seizures today. He is somewhat sleep deprived, but does not have chronic insomnia. There are no significant injuries. He has neck pain, related to injury from seizure. Cervical spine imaging is normal. Repeat evaluation, after he became lucid, does not indicate a consistent inability to move back or signs and symptoms of ligamentous instability. There are no metabolic instability, his or her suspected bacterial infections, as a cause of seizures. He will need to be admitted for monitoring and further treatment.   Nursing Notes Reviewed/ Care Coordinated, and agree without changes. Applicable Imaging Reviewed.  Interpretation of Laboratory Data incorporated into ED treatment   Plan: Admit    Flint Melter, MD 04/16/13 [phone]

## 2013-04-16 NOTE — ED Notes (Signed)
Hooked pt back up to monitor after returning from radiology  

## 2013-04-16 NOTE — ED Notes (Signed)
MD at bedside talking with pt wife

## 2013-04-16 NOTE — ED Notes (Signed)
Consulted EDP Effie ShyWentz about ativan order. Hold admin at this time.

## 2013-04-16 NOTE — ED Notes (Signed)
Pt. Wife witnessed pt fall to the ground and began to shake hitting head on the baby gate. Presents in Post Ictal state w/ trauma to the mouth. C/o Head, neck and left leg pain. Pt. Has no hx of seiz activity but of HTN.

## 2013-04-16 NOTE — ED Notes (Signed)
MD at bedside. 

## 2013-04-16 NOTE — ED Notes (Signed)
Ativan held per order after seizure stopped

## 2013-04-16 NOTE — ED Notes (Signed)
Pt had a witnessed seizure lasted approx 60-90 seconds, pt bit tongue during seizure.  Wife at bedside and witnessed seizure states the seizure was the same as before

## 2013-04-16 NOTE — ED Notes (Signed)
Patient returned from xray, monitor reapplied 

## 2013-04-16 NOTE — H&P (Signed)
History and Physical       Hospital Admission Note Date: 04/16/2013  Patient name: Ryett Hamman Medical record number: [id number] Date of birth: 06-Dec-1985 Age: 28 y.o. Gender: male  PCP: No PCP Per Patient    Chief Complaint:  Seizures today  HPI: Patient is a 28 year old male with no past medical history was brought to the ER via EMS for seizures. History was obtained from the patient and his wife. 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 This morning when he was done with a shift, he noticed a flat tire on his car and finally got home around 10 AM. At 10:30 AM he was talking to his wife in the kitchen when he suddenly fell onto the child safety gate, hitting his head and neck. Patient's wife noticed that he had generalized tonic-clonic shaking, he became stiff and his eyes rolled up, the whole episode lasted about 3-5 minutes. After he stopped taking he was not responsive. Paramedics had arrived and during the transportation he was noticed to be postictal. Patient's wife reported that he had another brief seizure episode in the ER around 2 PM. Patient has not been getting enough sleep, they have a newborn baby at home who is 1 weeks old, along with 2 other children, his wife had cesarean section and he has been helping significantly with the baby. He also has been working third shift. He had been taking tramadol and ibuprofen for his ankle sprain pain.   Otherwise patient denies any history/family history of seizures or epilepsy. He denied any fevers, chills, recent viral illness. Patient states since his first seizure episode he's been having numbness and tingling in his arms, back pain and neck pain and headache.  Review of Systems:  Constitutional: Denies fever, chills, diaphoresis, poor appetite and fatigue.  HEENT: Denies photophobia, eye pain, redness, hearing loss, ear  pain, congestion, sore throat, rhinorrhea, sneezing, mouth sores, trouble swallowing, neck pain, neck stiffness and tinnitus.   Respiratory: Denies SOB, DOE, cough, chest tightness,  and wheezing.   Cardiovascular: Denies chest pain, palpitations and leg swelling.  Gastrointestinal: Denies nausea, vomiting, abdominal pain, diarrhea, constipation, blood in stool and abdominal distention.  Genitourinary: Denies dysuria, urgency, frequency, hematuria, flank pain and difficulty urinating.  Musculoskeletal: Denies myalgias, back pain, joint swelling, arthralgias and gait problem. + left ankle sprain Skin: Denies pallor, rash and wound.  Neurological: Please see history of present illness  Hematological: Denies adenopathy. Easy bruising, personal or family bleeding history  Psychiatric/Behavioral: Denies suicidal ideation, mood changes, confusion, nervousness, sleep disturbance and agitation  Past Medical History: History reviewed. No pertinent past medical history. Past Surgical History  Procedure Laterality Date  . Knee surgery      Medications: Prior to Admission medications   Medication Sig Start Date End Date Taking? Authorizing Provider  ibuprofen (ADVIL,MOTRIN) 800 MG tablet Take 800 mg by mouth every 8 (eight) hours as needed (pain).   Yes Historical Provider, MD  OVER THE COUNTER MEDICATION Take 2 tablets by mouth daily. Joint Gummies   Yes Historical Provider, MD  traMADol (ULTRAM) 50 MG tablet Take 50 mg by mouth every 12 (twelve) hours as needed for moderate pain.   Yes Historical Provider, MD    Allergies:  No Known Allergies  Social History:  reports that he has never smoked. He does not have any smokeless tobacco history on file. He reports that he drinks alcohol occasionally. He reports  that he does not use illicit drugs.  Family History: Family History  Problem Relation Age of Onset  . Hypertension Father   . Diabetes Maternal Grandmother   . Stroke Maternal  Grandmother     Physical Exam: Blood pressure 128/73, pulse 100, temperature 98.4 F (36.9 C), temperature source Oral, resp. rate 28, height 5\' 11"  (1.803 m), weight 127.007 kg (280 lb), SpO2 100.00%. General: Alert, awake, oriented x3, in no acute distress. HEENT: normocephalic, atraumatic, anicteric sclera, pink conjunctiva, pupils equal and reactive to light and accomodation, oropharynx clear, tongue bite on both sides Neck: + Cervical collar Heart: Regular rate and rhythm, without murmurs, rubs or gallops. Lungs: Clear to auscultation bilaterally, no wheezing, rales or rhonchi. Abdomen: Soft, nontender, nondistended, positive bowel sounds, no masses. Extremities: No clubbing, cyanosis or edema with positive pedal pulses. Neuro: Grossly intact, no focal neurological deficits, strength 5/5 upper and lower extremities bilaterally Psych: alert and oriented x 3, normal mood and affect Skin: no rashes or lesions, warm and dry   LABS on Admission:  Basic Metabolic Panel:  Recent Labs Lab 04/16/13 1132  NA 138  K 3.9  CL 100  CO2 23  GLUCOSE 115*  BUN 15  CREATININE 1.06  CALCIUM 9.4   Liver Function Tests: No results found for this basename: AST, ALT, ALKPHOS, BILITOT, PROT, ALBUMIN,  in the last 168 hours No results found for this basename: LIPASE, AMYLASE,  in the last 168 hours No results found for this basename: AMMONIA,  in the last 168 hours CBC:  Recent Labs Lab 04/16/13 1132  WBC 12.2*  HGB 15.4  HCT 44.8  MCV 80.7  PLT 230   Cardiac Enzymes: No results found for this basename: CKTOTAL, CKMB, CKMBINDEX, TROPONINI,  in the last 168 hours BNP: No components found with this basename: POCBNP,  CBG:  Recent Labs Lab 04/16/13 1119  GLUCAP 116*     Radiological Exams on Admission: Ct Head Wo Contrast  04/16/2013   CLINICAL DATA:  Fall status post seizure  EXAM: CT HEAD WITHOUT CONTRAST  CT CERVICAL SPINE WITHOUT CONTRAST  TECHNIQUE: Multidetector CT  imaging of the head and cervical spine was performed following the standard protocol without intravenous contrast. Multiplanar CT image reconstructions of the cervical spine were also generated.  COMPARISON:  None.  FINDINGS: CT HEAD FINDINGS  No acute intracranial abnormality. Specifically, no hemorrhage, hydrocephalus, mass lesion, acute infarction, or significant intracranial injury. No acute calvarial abnormality. Visualized paranasal sinuses and mastoid air cells are patent.  CT CERVICAL SPINE FINDINGS  There is straightening with minimal reversal of the normal cervical lordosis. This may represent collar placement, muscle spasm and/or positioning. Disk spaces are normal and there is no significant disk degeneration. No spondylosis is identified and there is no spinal or foraminal stenosis. There is no prevertebral soft tissue thickening.  No fracture is identified in the cervical spine. No mass lesion is present.  IMPRESSION: 1. No evidence of focal acute intracranial abnormalities 2. Mild reversal of the normal cervical lordosis may reflect muscle spasm, collar placement, or positioning otherwise unremarkable CT of the cervical spine.   Electronically Signed   By: Salome HolmesHector  Cooper M.D.   On: 04/16/2013 12:33   Ct Cervical Spine Wo Contrast  04/16/2013   CLINICAL DATA:  Fall status post seizure  EXAM: CT HEAD WITHOUT CONTRAST  CT CERVICAL SPINE WITHOUT CONTRAST  TECHNIQUE: Multidetector CT imaging of the head and cervical spine was performed following the standard protocol without intravenous contrast.  Multiplanar CT image reconstructions of the cervical spine were also generated.  COMPARISON:  None.  FINDINGS: CT HEAD FINDINGS  No acute intracranial abnormality. Specifically, no hemorrhage, hydrocephalus, mass lesion, acute infarction, or significant intracranial injury. No acute calvarial abnormality. Visualized paranasal sinuses and mastoid air cells are patent.  CT CERVICAL SPINE FINDINGS  There is  straightening with minimal reversal of the normal cervical lordosis. This may represent collar placement, muscle spasm and/or positioning. Disk spaces are normal and there is no significant disk degeneration. No spondylosis is identified and there is no spinal or foraminal stenosis. There is no prevertebral soft tissue thickening.  No fracture is identified in the cervical spine. No mass lesion is present.  IMPRESSION: 1. No evidence of focal acute intracranial abnormalities 2. Mild reversal of the normal cervical lordosis may reflect muscle spasm, collar placement, or positioning otherwise unremarkable CT of the cervical spine.   Electronically Signed   By: Salome Holmes M.D.   On: 04/16/2013 12:33   Dg Chest Port 1 View  04/16/2013   CLINICAL DATA:  Seizure.  Chest pain.  EXAM: PORTABLE CHEST - 1 VIEW  COMPARISON:  None.  FINDINGS: Portable view of the chest demonstrates elevation of the right hemidiaphragm. Probable atelectasis at the right lung base. Otherwise, the lungs are clear. Heart and mediastinum are within normal limits. Trachea is midline.  IMPRESSION: Elevation of the right hemidiaphragm with volume loss. No focal airspace disease.   Electronically Signed   By: Richarda Overlie M.D.   On: 04/16/2013 12:17    Assessment/Plan Principal Problem:   Seizure: No history of epilepsy or prior seizures, possibly due to lack of sleep and lowered seizure threshold due to tramadol. CT head was negative for any focal intracranial abnormalities - Admit to step down for close monitoring overnight, placed on seizure precautions, neurochecks every 4 hours - Patient was given loading dose of fosphenytoin in the ED.  - Neurology consulted, will obtain EEG, urine drug screen and further decision of imagings and seizure medications per neurology. Patient's wife was explained that he may not be able to drive due to the seizure episodes until he is cleared by neurology outpatient  Dehydration - Continue gentle IV  fluids  DVT prophylaxis: SCDs  CODE STATUS: Full CODE STATUS  Family Communication: Admission, patients condition and plan of care including tests being ordered have been discussed with the patient and wife who indicates understanding and agree with the plan and Code Status   Further plan will depend as patient's clinical course evolves and further radiologic and laboratory data become available.   Time Spent on Admission: 1 hour  Appolonia Ackert M.D. [facility] 04/16/2013, 4:30 PM Pager: [phone]  If 7PM-7AM, please contact night-coverage [email or web address] Password [email or web address]

## 2013-04-16 NOTE — ED Notes (Addendum)
MD at bedside and PA

## 2013-04-16 NOTE — ED Notes (Signed)
Notified EDP that pt. Is complaining of Neck/Shoulder and Leg pain. Placing C-Collar on pt.

## 2013-04-16 NOTE — ED Notes (Signed)
Internal Med at Bedside

## 2013-04-16 NOTE — ED Notes (Signed)
Took pts CBG it was 116

## 2013-04-16 NOTE — ED Notes (Signed)
C-Collar removed by EDP, Effie ShyWentz

## 2013-04-16 NOTE — ED Notes (Signed)
o2 on via [place] at 4lpm. Pt more alert now will open eyes when talked to.  Pt willnot answer questions at present

## 2013-04-16 NOTE — Consult Note (Signed)
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 This morning he was standing in the kitchen talking with his wife at about 10:30 AM when he suddenly fell to the floor and began having generalized tonic-clonic seizure activity. This lasted approximately 3-5 minutes. During the episode the patient struck his head repeatedly on a child safety gate at the edge of the kitchen. EMS was summoned and he was brought to the [facility] ED. A CT scan of the head was performed which was unremarkable. The patient had a second seizure at around 2 PM today which was witnessed by his wife again as well as the emergency department physician. His wife stated that this seizure did not last as long but was very similar to his previous seizure. The patient has no prior history of seizures and these were the only 2 apparent seizures he's had. The patient did injure his tongue and appeared post ictal after each episode.  Of note the patient injured his left ankle approximately one year ago. It has been slow to heal and often gives him discomfort. He was seen by a physician after the injury and prescribed Ultram. The bottle initially contained 60 tablets and the patient's wife reports that there are only 5 missing out of the entire bottle. The patient states he has not had any pain medication in 3-4 weeks. The patient drinks alcohol rarely. He denies any illegal drug use. He is not on any other prescribed medications. He states he was told he has pre-hypertension but has not been treated. At this time he is uncomfortable with diffuse musculoskeletal pain especially in his neck and low back. He reports some numbness and tingling of his hands and feet and states they feel cold. A CT of the cervical spine showed mild reversal of the normal cervical lordosis  which may reflect muscle spasm,The plan is for him to be admitted for further evaluation.  History reviewed. No pertinent past medical history.  Past Surgical History  Procedure Laterality Date  . Knee surgery      Family History  Problem Relation Age of Onset  . Hypertension Father   . Diabetes Maternal Grandmother   . Stroke Maternal Grandmother     Social History:  reports that he has never smoked. He does not have any smokeless tobacco history on file. He reports that he drinks alcohol. He reports that he does not use illicit drugs.  No Known Allergies  Medications:  No current facility-administered medications for this encounter.   Current Outpatient Prescriptions  Medication Sig Dispense Refill  . ibuprofen (ADVIL,MOTRIN) 800 MG tablet Take 800 mg by mouth every 8 (eight) hours as needed (pain).      Marland Kitchen OVER THE COUNTER MEDICATION Take 2 tablets by mouth daily. Joint Gummies      . traMADol (ULTRAM) 50 MG tablet Take 50 mg by mouth every 12 (twelve) hours as needed for moderate pain.         ROS: History obtained from the patient and wife  General ROS: negative for - chills, fatigue, fever, night sweats, weight gain or weight loss. Positive for lack of sleep. Psychological ROS: negative for - behavioral disorder, hallucinations, memory difficulties, mood swings or suicidal ideation Ophthalmic ROS: negative for - blurry vision, double vision, eye pain or loss of  vision ENT ROS: negative for - epistaxis, nasal discharge, oral lesions, sore throat, tinnitus or vertigo Allergy and Immunology ROS: negative for - hives or itchy/watery eyes Hematological and Lymphatic ROS: negative for - bleeding problems, bruising or swollen lymph nodes Endocrine ROS: negative for - galactorrhea, hair pattern changes, polydipsia/polyuria or temperature intolerance Respiratory ROS: negative for - cough, hemoptysis, shortness of breath or wheezing Cardiovascular ROS: negative for - chest pain,  dyspnea on exertion, edema or irregular heartbeat Gastrointestinal ROS: negative for - abdominal pain, diarrhea, hematemesis, nausea/vomiting or stool incontinence Genito-Urinary ROS: negative for - dysuria, hematuria, incontinence or urinary frequency/urgency Musculoskeletal ROS: negative for - joint swelling or muscular weakness. Positive for a left ankle injury which still causes some discomfort. Neurological ROS: as noted in HPI Dermatological ROS: negative for rash and skin lesion changes   Physical Examination: Blood pressure 129/77, pulse 103, temperature 98.4 F (36.9 C), temperature source Oral, resp. rate 32, height 5\' 11"  (1.803 m), weight 127.007 kg (280 lb), SpO2 100.00%.  Neurologic Examination Mental Status: The patient is fairly alert although he keeps his eyes closed most of the time. He is oriented to person place and date although he often asks his wife the same questions repeatedly.  Speech fluent without evidence of aphasia.  Able to follow 3 step commands without difficulty. Cranial Nerves: II: Discs not visualized; Visual fields grossly normal, pupils equal, round, reactive to light and accommodation III,IV, VI: ptosis not present, extra-ocular motions intact bilaterally V,VII: smile symmetric, facial light touch sensation normal bilaterally VIII: hearing normal bilaterally IX,X: gag reflex present XI: bilateral shoulder shrug - the patient states she's unable to shrug his shoulders do to musculoskeletal pain XII: midline tongue extension - bite marks noted on both sides of his tongue. Motor: The patient moves all extremities against gravity although he reports it is painful to hold his arms and legs in the air. Tone and bulk:normal tone throughout; no atrophy noted Sensory: Pinprick and light touch intact throughout, bilaterally Deep Tendon Reflexes: 1+ and symmetric throughout Plantars: Right: downgoing   Left: downgoing Cerebellar: normal finger-to-nose and  heel-to-shin not attempted secondary to pain Gait: The patient was not ambulated secondary to safety concerns   Laboratory Studies:   Basic Metabolic Panel:  Recent Labs Lab 04/16/13 1132  NA 138  K 3.9  CL 100  CO2 23  GLUCOSE 115*  BUN 15  CREATININE 1.06  CALCIUM 9.4    Liver Function Tests: No results found for this basename: AST, ALT, ALKPHOS, BILITOT, PROT, ALBUMIN,  in the last 168 hours No results found for this basename: LIPASE, AMYLASE,  in the last 168 hours No results found for this basename: AMMONIA,  in the last 168 hours  CBC:  Recent Labs Lab 04/16/13 1132  WBC 12.2*  HGB 15.4  HCT 44.8  MCV 80.7  PLT 230    Cardiac Enzymes: No results found for this basename: CKTOTAL, CKMB, CKMBINDEX, TROPONINI,  in the last 168 hours  BNP: No components found with this basename: POCBNP,   CBG:  Recent Labs Lab 04/16/13 1119  GLUCAP 116*    Microbiology: No results found for this or any previous visit.  Coagulation Studies: No results found for this basename: LABPROT, INR,  in the last 72 hours  Urinalysis: No results found for this basename: COLORURINE, APPERANCEUR, LABSPEC, PHURINE, GLUCOSEU, HGBUR, BILIRUBINUR, KETONESUR, PROTEINUR, UROBILINOGEN, NITRITE, LEUKOCYTESUR,  in the last 168 hours  Lipid Panel:  No results found for this basename: chol, trig,  hdl, cholhdl, vldl, ldlcalc    HgbA1C:  No results found for this basename: HGBA1C    Urine Drug Screen:   No results found for this basename: labopia, cocainscrnur, labbenz, amphetmu, thcu, labbarb    Alcohol Level: No results found for this basename: ETH,  in the last 168 hours  Other results: EKG: SR rate 96 BPM  Imaging:  Ct Head Wo Contrast 04/16/2013    No evidence of focal acute intracranial abnormalities      Ct Cervical Spine Wo Contrast 04/16/2013    Mild reversal of the normal cervical lordosis may reflect muscle spasm, collar placement, or positioning otherwise unremarkable  CT of the cervical spine.     Dg Chest Port 1 View 04/16/2013    Elevation of the right hemidiaphragm with volume loss. No focal airspace disease.      Assessment/Plan: 28 year old male presenting with new onset seizure activity with 2 generalized seizures today that were witnessed.  Recommendations: 1. Dilantin 100 mg every 8 hours 2. MRI of the brain without and with contrast 3. EEG routine adult 4. Discontinue Ultram 5. No operating of a motor vehicle nor hazardous equipment until cleared medically.  Delton See PA-C [facility] Pager [phone] 04/16/2013, 5:00 PM  I personally participate in this patient's evaluation and management, including formulating the above clinical impression and management recommendations.  Venetia Maxon M.D. [facility] [phone]

## 2013-04-17 ENCOUNTER — Encounter (HOSPITAL_COMMUNITY): Payer: Self-pay | Admitting: *Deleted

## 2013-04-17 DIAGNOSIS — E860 Dehydration: Secondary | ICD-10-CM

## 2013-04-17 LAB — COMPREHENSIVE METABOLIC PANEL
ALK PHOS: 99 U/L (ref 39–117)
ALT: 27 U/L (ref 0–53)
AST: 49 U/L — ABNORMAL HIGH (ref 0–37)
Albumin: 3.5 g/dL (ref 3.5–5.2)
BILIRUBIN TOTAL: 0.5 mg/dL (ref 0.3–1.2)
BUN: 11 mg/dL (ref 6–23)
CO2: 21 meq/L (ref 19–32)
Calcium: 8.6 mg/dL (ref 8.4–10.5)
Chloride: 104 mEq/L (ref 96–112)
Creatinine, Ser: 1.06 mg/dL (ref 0.50–1.35)
GLUCOSE: 130 mg/dL — AB (ref 70–99)
POTASSIUM: 3.7 meq/L (ref 3.7–5.3)
Sodium: 140 mEq/L (ref 137–147)
TOTAL PROTEIN: 7.5 g/dL (ref 6.0–8.3)

## 2013-04-17 LAB — CBC
HEMATOCRIT: 43.3 % (ref 39.0–52.0)
HEMOGLOBIN: 14.7 g/dL (ref 13.0–17.0)
MCH: 27.4 pg (ref 26.0–34.0)
MCHC: 33.9 g/dL (ref 30.0–36.0)
MCV: 80.8 fL (ref 78.0–100.0)
Platelets: 215 10*3/uL (ref 150–400)
RBC: 5.36 MIL/uL (ref 4.22–5.81)
RDW: 13.7 % (ref 11.5–15.5)
WBC: 14.1 10*3/uL — AB (ref 4.0–10.5)

## 2013-04-17 LAB — PHENYTOIN LEVEL, TOTAL: Phenytoin Lvl: 7.5 ug/mL — ABNORMAL LOW (ref 10.0–20.0)

## 2013-04-17 MED ORDER — PHENOL 1.4 % MT LIQD
1.0000 | OROMUCOSAL | Status: DC | PRN
  Administered 2013-04-17: 1 via OROMUCOSAL
  Filled 2013-04-17: qty 177

## 2013-04-17 NOTE — Progress Notes (Signed)
Subjective: No recurrence of seizure activity reported. Patient had no complaints except for muscle soreness of his back and tenderness of his tongue.  Objective: Current vital signs: BP 141/92  Pulse 91  Temp(Src) 98.2 F (36.8 C) (Axillary)  Resp 24  Ht 5\' 11"  (1.803 m)  Wt 130 kg (286 lb 9.6 oz)  BMI 39.99 kg/m2  SpO2 98%  Neurologic Exam: Alert and in no acute distress. Mental status was normal. Extraocular movements were full and conjugate. No facial weakness noted. Speech was dysarthric but consistent with swelling and tenderness involving his tongue. Abrasions noted bilaterally. Patient moved extremities equally no signs of focal weakness.  Medications: I have reviewed the patient's current medications.  Assessment/Plan: New-onset generalized seizure activity of unclear etiology. Examination and initial CT scan as well as laboratory studies were unremarkable. Patient is currently on Dilantin because of multiple seizures. Dilantin level this morning was 7.5. Corrected Dilantin level was  9.4  I am recommending no changes in current Dilantin dosing. Level is only minimally low. Recommend repeat level in the a.m. as planned.  MRI of the brain and EEG are pending. We will continue to follow this patient with you.  C.R. Roseanne RenoStewart, MD [facility] [phone]  04/17/2013  9:03 AM

## 2013-04-17 NOTE — Progress Notes (Signed)
Patient ID: Peter Barr  male  ZOX:[id number]    DOB: [birth year]    DOA: 04/16/2013  PCP: No PCP Per Patient  Assessment/Plan: Principal Problem:   Seizure - No repeat episode - MRI brain, EEG still pending - Given loading dose of fosphenytoin, started on oral Dilantin by neurology. Level corrected 9.4 today - Still has significant swelling and tenderness of his tongue, placed on full liquid diet for today - Dilantin level in a.m.  Active Problems:   Sprain of ankle, unspecified site - Continue pain control as needed    Dehydration - Gentle hydration   DVT Prophylaxis: SCDs  Code Status: Full CODE STATUS  Family Communication: Discussed with patient and his wife at the bedside  Disposition: Transfer to neuro floor today    Subjective: Denies any specific complaints, no recurrence of seizures, having difficulty talking due to swelling in his tongue  Objective: Weight change:   Intake/Output Summary (Last 24 hours) at 04/17/13 1226 Last data filed at 04/17/13 1000  Gross per 24 hour  Intake 2096.67 ml  Output      0 ml  Net 2096.67 ml   Blood pressure 139/78, pulse 90, temperature 98.5 F (36.9 C), temperature source Oral, resp. rate 14, height 5\' 11"  (1.803 m), weight 130 kg (286 lb 9.6 oz), SpO2 99.00%.  Physical Exam: General: Alert and awake, oriented x3, not in any acute distress. Difficulty speaking secondary to swelling and tenderness on the tongue HEENT: anicteric sclera, PERLA, EOMI, tongue swelling CVS: S1-S2 clear, no murmur rubs or gallops Chest: clear to auscultation bilaterally, no wheezing, rales or rhonchi Abdomen: soft nontender, nondistended, normal bowel sounds  Extremities: no cyanosis, clubbing or edema noted bilaterally Neuro: Cranial nerves II-XII intact, no focal neurological deficits  Lab Results: Basic Metabolic Panel:  Recent Labs Lab 04/16/13 1132 04/17/13 0335  NA 138 140  K 3.9 3.7  CL 100 104  CO2 23 21  GLUCOSE 115* 130*   BUN 15 11  CREATININE 1.06 1.06  CALCIUM 9.4 8.6   Liver Function Tests:  Recent Labs Lab 04/17/13 0335  AST 49*  ALT 27  ALKPHOS 99  BILITOT 0.5  PROT 7.5  ALBUMIN 3.5   No results found for this basename: LIPASE, AMYLASE,  in the last 168 hours No results found for this basename: AMMONIA,  in the last 168 hours CBC:  Recent Labs Lab 04/16/13 1132 04/17/13 0335  WBC 12.2* 14.1*  HGB 15.4 14.7  HCT 44.8 43.3  MCV 80.7 80.8  PLT 230 215   Cardiac Enzymes: No results found for this basename: CKTOTAL, CKMB, CKMBINDEX, TROPONINI,  in the last 168 hours BNP: No components found with this basename: POCBNP,  CBG:  Recent Labs Lab 04/16/13 1119  GLUCAP 116*     Micro Results: Recent Results (from the past 240 hour(s))  MRSA PCR SCREENING     Status: None   Collection Time    04/16/13  5:25 PM      Result Value Range Status   MRSA by PCR NEGATIVE  NEGATIVE Final   Comment:            The GeneXpert MRSA Assay (FDA     approved for NASAL specimens     only), is one component of a     comprehensive MRSA colonization     surveillance program. It is not     intended to diagnose MRSA     infection nor to guide or  monitor treatment for     MRSA infections.    Studies/Results: Ct Head Wo Contrast  04/16/2013   CLINICAL DATA:  Fall status post seizure  EXAM: CT HEAD WITHOUT CONTRAST  CT CERVICAL SPINE WITHOUT CONTRAST  TECHNIQUE: Multidetector CT imaging of the head and cervical spine was performed following the standard protocol without intravenous contrast. Multiplanar CT image reconstructions of the cervical spine were also generated.  COMPARISON:  None.  FINDINGS: CT HEAD FINDINGS  No acute intracranial abnormality. Specifically, no hemorrhage, hydrocephalus, mass lesion, acute infarction, or significant intracranial injury. No acute calvarial abnormality. Visualized paranasal sinuses and mastoid air cells are patent.  CT CERVICAL SPINE FINDINGS  There is  straightening with minimal reversal of the normal cervical lordosis. This may represent collar placement, muscle spasm and/or positioning. Disk spaces are normal and there is no significant disk degeneration. No spondylosis is identified and there is no spinal or foraminal stenosis. There is no prevertebral soft tissue thickening.  No fracture is identified in the cervical spine. No mass lesion is present.  IMPRESSION: 1. No evidence of focal acute intracranial abnormalities 2. Mild reversal of the normal cervical lordosis may reflect muscle spasm, collar placement, or positioning otherwise unremarkable CT of the cervical spine.   Electronically Signed   By: Salome HolmesHector  Cooper M.D.   On: 04/16/2013 12:33   Ct Cervical Spine Wo Contrast  04/16/2013   CLINICAL DATA:  Fall status post seizure  EXAM: CT HEAD WITHOUT CONTRAST  CT CERVICAL SPINE WITHOUT CONTRAST  TECHNIQUE: Multidetector CT imaging of the head and cervical spine was performed following the standard protocol without intravenous contrast. Multiplanar CT image reconstructions of the cervical spine were also generated.  COMPARISON:  None.  FINDINGS: CT HEAD FINDINGS  No acute intracranial abnormality. Specifically, no hemorrhage, hydrocephalus, mass lesion, acute infarction, or significant intracranial injury. No acute calvarial abnormality. Visualized paranasal sinuses and mastoid air cells are patent.  CT CERVICAL SPINE FINDINGS  There is straightening with minimal reversal of the normal cervical lordosis. This may represent collar placement, muscle spasm and/or positioning. Disk spaces are normal and there is no significant disk degeneration. No spondylosis is identified and there is no spinal or foraminal stenosis. There is no prevertebral soft tissue thickening.  No fracture is identified in the cervical spine. No mass lesion is present.  IMPRESSION: 1. No evidence of focal acute intracranial abnormalities 2. Mild reversal of the normal cervical lordosis  may reflect muscle spasm, collar placement, or positioning otherwise unremarkable CT of the cervical spine.   Electronically Signed   By: Salome HolmesHector  Cooper M.D.   On: 04/16/2013 12:33   Dg Chest Port 1 View  04/16/2013   CLINICAL DATA:  Seizure.  Chest pain.  EXAM: PORTABLE CHEST - 1 VIEW  COMPARISON:  None.  FINDINGS: Portable view of the chest demonstrates elevation of the right hemidiaphragm. Probable atelectasis at the right lung base. Otherwise, the lungs are clear. Heart and mediastinum are within normal limits. Trachea is midline.  IMPRESSION: Elevation of the right hemidiaphragm with volume loss. No focal airspace disease.   Electronically Signed   By: Richarda OverlieAdam  Henn M.D.   On: 04/16/2013 12:17    Medications: Scheduled Meds: . phenytoin  100 mg Oral Custom      LOS: 1 day   Danette Weinfeld M.D. [facility] 04/17/2013, 12:26 PM Pager: [phone]  If 7PM-7AM, please contact night-coverage [email or web address] Password [email or web address]

## 2013-04-18 ENCOUNTER — Inpatient Hospital Stay (HOSPITAL_COMMUNITY): Payer: 59

## 2013-04-18 DIAGNOSIS — M542 Cervicalgia: Secondary | ICD-10-CM

## 2013-04-18 DIAGNOSIS — R569 Unspecified convulsions: Secondary | ICD-10-CM

## 2013-04-18 DIAGNOSIS — S93409A Sprain of unspecified ligament of unspecified ankle, initial encounter: Secondary | ICD-10-CM

## 2013-04-18 DIAGNOSIS — M549 Dorsalgia, unspecified: Secondary | ICD-10-CM

## 2013-04-18 LAB — CBC
HEMATOCRIT: 41.4 % (ref 39.0–52.0)
Hemoglobin: 14.2 g/dL (ref 13.0–17.0)
MCH: 28 pg (ref 26.0–34.0)
MCHC: 34.3 g/dL (ref 30.0–36.0)
MCV: 81.5 fL (ref 78.0–100.0)
PLATELETS: 219 10*3/uL (ref 150–400)
RBC: 5.08 MIL/uL (ref 4.22–5.81)
RDW: 13.6 % (ref 11.5–15.5)
WBC: 11.1 10*3/uL — ABNORMAL HIGH (ref 4.0–10.5)

## 2013-04-18 LAB — CK: CK TOTAL: 2683 U/L — AB (ref 7–232)

## 2013-04-18 LAB — PHENYTOIN LEVEL, TOTAL: Phenytoin Lvl: 6.9 ug/mL — ABNORMAL LOW (ref 10.0–20.0)

## 2013-04-18 MED ORDER — METHOCARBAMOL 500 MG PO TABS
500.0000 mg | ORAL_TABLET | Freq: Three times a day (TID) | ORAL | Status: DC
  Administered 2013-04-18 – 2013-04-19 (×4): 500 mg via ORAL
  Filled 2013-04-18 (×6): qty 1

## 2013-04-18 MED ORDER — NAPROXEN 375 MG PO TABS
375.0000 mg | ORAL_TABLET | Freq: Three times a day (TID) | ORAL | Status: DC
  Administered 2013-04-18 (×2): 375 mg via ORAL
  Filled 2013-04-18 (×6): qty 1

## 2013-04-18 MED ORDER — MAGIC MOUTHWASH W/LIDOCAINE
5.0000 mL | Freq: Three times a day (TID) | ORAL | Status: DC | PRN
  Administered 2013-04-18 – 2013-04-21 (×6): 5 mL via ORAL
  Filled 2013-04-18 (×7): qty 5

## 2013-04-18 MED ORDER — GADOBENATE DIMEGLUMINE 529 MG/ML IV SOLN
20.0000 mL | Freq: Once | INTRAVENOUS | Status: AC
  Administered 2013-04-18: 20 mL via INTRAVENOUS

## 2013-04-18 NOTE — Progress Notes (Signed)
Called to give report on 5 west to nurse but she was busy with md about a pt and stated she would call me back. Called MRI to notify that pt was on floor but would soon go to 5w31. Pt notified as well. Pt is resting.

## 2013-04-18 NOTE — Progress Notes (Signed)
Patient ID: Peter Barr  male  UEA:[id number]    DOB: October 15, 1985    DOA: 04/16/2013  PCP: No PCP Per Patient  Assessment/Plan: Principal Problem:   Seizure - No repeat episode - MRI brain, EEG still pending - Given loading dose of fosphenytoin, started on oral Dilantin by neurology at 100mg  three times a day. Appreciate neurology recommendations.  - Still has significant swelling and tenderness of his tongue, placed on full liquid diet for today, will be advanced to soft diet as per patient's request.  - Dilantin level in a.m is 6.9.  Active Problems:   Sprain of ankle, unspecified site - Continue pain control as needed - robaxin and naproxen as needed.     Dehydration - Gentle hydration   Tongue bite:  - magic mouth wash with lidocaine ordered to help relieve the pain.  - chloraseptic as needed.    DVT Prophylaxis: SCDs  Code Status: Full CODE STATUS  Family Communication: Discussed with patient and his wife at the bedside  Disposition: Transfer to neuro floor when bed available    Subjective: Denies any specific complaints, no recurrence of seizures, . Answered questions . Reports back and hamstring spasms. Will order CK levels .   Objective: Weight change:   Intake/Output Summary (Last 24 hours) at 04/18/13 0807 Last data filed at 04/17/13 1600  Gross per 24 hour  Intake   1390 ml  Output    450 ml  Net    940 ml   Blood pressure 139/80, pulse 90, temperature 99.3 F (37.4 C), temperature source Oral, resp. rate 23, height 5\' 11"  (1.803 m), weight 130 kg (286 lb 9.6 oz), SpO2 96.00%.  Physical Exam: General: Alert and awake, oriented x3, not in any acute distress. Difficulty speaking secondary to swelling and tenderness on the tongue HEENT: anicteric sclera, PERLA, EOMI, tongue swelling CVS: S1-S2 clear, no murmur rubs or gallops Chest: clear to auscultation bilaterally, no wheezing, rales or rhonchi Abdomen: soft nontender, nondistended, normal bowel  sounds  Extremities: no cyanosis, clubbing or edema noted bilaterally Neuro: Cranial nerves II-XII intact, no focal neurological deficits  Lab Results: Basic Metabolic Panel:  Recent Labs Lab 04/16/13 1132 04/17/13 0335  NA 138 140  K 3.9 3.7  CL 100 104  CO2 23 21  GLUCOSE 115* 130*  BUN 15 11  CREATININE 1.06 1.06  CALCIUM 9.4 8.6   Liver Function Tests:  Recent Labs Lab 04/17/13 0335  AST 49*  ALT 27  ALKPHOS 99  BILITOT 0.5  PROT 7.5  ALBUMIN 3.5   No results found for this basename: LIPASE, AMYLASE,  in the last 168 hours No results found for this basename: AMMONIA,  in the last 168 hours CBC:  Recent Labs Lab 04/17/13 0335 04/18/13 0250  WBC 14.1* 11.1*  HGB 14.7 14.2  HCT 43.3 41.4  MCV 80.8 81.5  PLT 215 219   Cardiac Enzymes: No results found for this basename: CKTOTAL, CKMB, CKMBINDEX, TROPONINI,  in the last 168 hours BNP: No components found with this basename: POCBNP,  CBG:  Recent Labs Lab 04/16/13 1119  GLUCAP 116*     Micro Results: Recent Results (from the past 240 hour(s))  MRSA PCR SCREENING     Status: None   Collection Time    04/16/13  5:25 PM      Result Value Range Status   MRSA by PCR NEGATIVE  NEGATIVE Final   Comment:  The GeneXpert MRSA Assay (FDA     approved for NASAL specimens     only), is one component of a     comprehensive MRSA colonization     surveillance program. It is not     intended to diagnose MRSA     infection nor to guide or     monitor treatment for     MRSA infections.    Studies/Results: Ct Head Wo Contrast  04/16/2013   CLINICAL DATA:  Fall status post seizure  EXAM: CT HEAD WITHOUT CONTRAST  CT CERVICAL SPINE WITHOUT CONTRAST  TECHNIQUE: Multidetector CT imaging of the head and cervical spine was performed following the standard protocol without intravenous contrast. Multiplanar CT image reconstructions of the cervical spine were also generated.  COMPARISON:  None.  FINDINGS: CT  HEAD FINDINGS  No acute intracranial abnormality. Specifically, no hemorrhage, hydrocephalus, mass lesion, acute infarction, or significant intracranial injury. No acute calvarial abnormality. Visualized paranasal sinuses and mastoid air cells are patent.  CT CERVICAL SPINE FINDINGS  There is straightening with minimal reversal of the normal cervical lordosis. This may represent collar placement, muscle spasm and/or positioning. Disk spaces are normal and there is no significant disk degeneration. No spondylosis is identified and there is no spinal or foraminal stenosis. There is no prevertebral soft tissue thickening.  No fracture is identified in the cervical spine. No mass lesion is present.  IMPRESSION: 1. No evidence of focal acute intracranial abnormalities 2. Mild reversal of the normal cervical lordosis may reflect muscle spasm, collar placement, or positioning otherwise unremarkable CT of the cervical spine.   Electronically Signed   By: Salome HolmesHector  Cooper M.D.   On: 04/16/2013 12:33   Ct Cervical Spine Wo Contrast  04/16/2013   CLINICAL DATA:  Fall status post seizure  EXAM: CT HEAD WITHOUT CONTRAST  CT CERVICAL SPINE WITHOUT CONTRAST  TECHNIQUE: Multidetector CT imaging of the head and cervical spine was performed following the standard protocol without intravenous contrast. Multiplanar CT image reconstructions of the cervical spine were also generated.  COMPARISON:  None.  FINDINGS: CT HEAD FINDINGS  No acute intracranial abnormality. Specifically, no hemorrhage, hydrocephalus, mass lesion, acute infarction, or significant intracranial injury. No acute calvarial abnormality. Visualized paranasal sinuses and mastoid air cells are patent.  CT CERVICAL SPINE FINDINGS  There is straightening with minimal reversal of the normal cervical lordosis. This may represent collar placement, muscle spasm and/or positioning. Disk spaces are normal and there is no significant disk degeneration. No spondylosis is  identified and there is no spinal or foraminal stenosis. There is no prevertebral soft tissue thickening.  No fracture is identified in the cervical spine. No mass lesion is present.  IMPRESSION: 1. No evidence of focal acute intracranial abnormalities 2. Mild reversal of the normal cervical lordosis may reflect muscle spasm, collar placement, or positioning otherwise unremarkable CT of the cervical spine.   Electronically Signed   By: Salome HolmesHector  Cooper M.D.   On: 04/16/2013 12:33   Dg Chest Port 1 View  04/16/2013   CLINICAL DATA:  Seizure.  Chest pain.  EXAM: PORTABLE CHEST - 1 VIEW  COMPARISON:  None.  FINDINGS: Portable view of the chest demonstrates elevation of the right hemidiaphragm. Probable atelectasis at the right lung base. Otherwise, the lungs are clear. Heart and mediastinum are within normal limits. Trachea is midline.  IMPRESSION: Elevation of the right hemidiaphragm with volume loss. No focal airspace disease.   Electronically Signed   By: Meriel PicaAdam  Henn M.D.  On: 04/16/2013 12:17    Medications: Scheduled Meds: . phenytoin  100 mg Oral Custom      LOS: 2 days   Keshonda Monsour M.D. [facility] 04/18/2013, 8:07 AM Pager: [phone]  If 7PM-7AM, please contact night-coverage [email or web address] Password [email or web address]

## 2013-04-18 NOTE — Progress Notes (Signed)
Peter Barr is a 28 y.o. male patient who transferred  From 2C awake, alert  & orientated  X 3, Full Code, VSS - Blood pressure 129/75, pulse 86, temperature 99.3 F (37.4 C), temperature source Oral, resp. rate 20, height 5\' 11"  (1.803 m), weight 130.455 kg (287 lb 9.6 oz), SpO2 91.00%.,no c/o shortness of breath, no c/o chest pain, no distress noted.    IV site WDL: forearm left, condition patent and no redness with a transparent dsg that's clean dry and intact.  Allergies:  No Known Allergies   Past Medical History  Diagnosis Date  . Hypertension     Pt orientation to unit, room and routine. SR up x 2, fall risk assessment complete with Patient and family verbalizing understanding of risks associated with falls. Pt verbalizes an understanding of how to use the call bell and to call for help before getting out of bed.  Skin, clean-dry- intact without evidence of bruising, or skin tears.   No evidence of skin break down noted on exam.     Will cont to monitor and assist as needed.  Cindra EvesCelano, Peter Freiman Czarnecki, RN 04/18/2013 3:52 PM

## 2013-04-18 NOTE — Progress Notes (Signed)
Subjective: Patient hasn't experienced a recurrent seizure. He is complaining of back and neck pain with soreness.  Objective: Current vital signs: BP 139/80  Pulse 90  Temp(Src) 99.3 F (37.4 C) (Oral)  Resp 23  Ht 5\' 11"  (1.803 m)  Wt 130 kg (286 lb 9.6 oz)  BMI 39.99 kg/m2  SpO2 96%  Neurologic Exam: Alert and in no acute distress. Mental status was normal. Speech was normal except for dysarthria associated with tenderness of his tongue.  Total Dilantin  was 6.9 this morning. His corrected level was 8.6.  Medications: I have reviewed the patient's current medications.  Assessment/Plan: New-onset seizure activity. Seizure disorder will need to be ruled out. MRI and EEG are pending. Dilantin level is slightly low. I will consult pharmacy for dosing.  I am ordering Naprosyn 375 mg 3 times a day and Robaxin 500 mg 3 times a day for back and neck pain.  We will continue to follow this patient with you.  C.R. Roseanne RenoStewart, MD [facility] [phone]   04/18/2013  8:37 AM

## 2013-04-18 NOTE — Progress Notes (Signed)
Pt is on 5w and notified 5w nurse who came in room 31 to take over care.

## 2013-04-18 NOTE — Progress Notes (Signed)
EEG Completed; Results Pending  

## 2013-04-18 NOTE — Progress Notes (Signed)
Pt is back to floor now. Pt in no distress.

## 2013-04-18 NOTE — Progress Notes (Signed)
Gave report to nurse on 5w and taking pt now.

## 2013-04-18 NOTE — Procedures (Signed)
ELECTROENCEPHALOGRAM REPORT Patient: Peter Barr       Room #: [id number] EEG No. ID: [id number] Age: 28 y.o.        Sex: male Referring Physician: Blake DivineAkula Report Date:  04/18/2013        Interpreting Physician: Aline BrochureSTEWART,Pattye Meda R  History: Peter Maskerhillip Donahey is an 28 y.o. male experienced 2 generalized seizures on 04/16/2013. Patient has no previous history of seizure activity.  Indications for study:  Rule out new onset seizure disorder.  Technique: This is an 18 channel routine scalp EEG performed at the bedside with bipolar and monopolar montages arranged in accordance to the international 10/20 system of electrode placement.   Description: This EEG recording was performed during wakefulness and during drowsiness. No tobacco activity during wakefulness consisted of 9-10 Hz symmetrical alpha rhythm that attenuates with eye opening. During drowsiness there was slowing of background activity with mixed irregular delta and theta activity which appeared to be somewhat rhythmic at times. Photic stimulation produced symmetrical occipital driving response. Hyperventilation was not performed. No epileptiform discharges are recorded. There is no abnormal slowing of cerebral activity.   Interpretation: This is a normal EEG recording during wakefulness and during drowsiness. No evidence of an epileptic disorder is demonstrated.  Venetia MaxonR Burnis Kaser M.D. [facility] [phone]

## 2013-04-18 NOTE — Progress Notes (Signed)
Pt is off floor having an EEG

## 2013-04-19 LAB — PHENYTOIN LEVEL, TOTAL: PHENYTOIN LVL: 4 ug/mL — AB (ref 10.0–20.0)

## 2013-04-19 LAB — ALBUMIN: Albumin: 3.4 g/dL — ABNORMAL LOW (ref 3.5–5.2)

## 2013-04-19 MED ORDER — SODIUM CHLORIDE 0.9 % IV SOLN
500.0000 mg | Freq: Once | INTRAVENOUS | Status: AC
  Administered 2013-04-19: 500 mg via INTRAVENOUS
  Filled 2013-04-19: qty 10

## 2013-04-19 MED ORDER — NAPROXEN 375 MG PO TABS
375.0000 mg | ORAL_TABLET | Freq: Three times a day (TID) | ORAL | Status: DC
  Administered 2013-04-19 – 2013-04-22 (×10): 375 mg via ORAL
  Filled 2013-04-19 (×14): qty 1

## 2013-04-19 NOTE — Progress Notes (Signed)
Patient ID: Kennan Detter  male  UEA:[id number]    DOB: 17-Jan-1986    DOA: 04/16/2013  PCP: No PCP Per Patient Brief HPI: Patient is a 28 year old male with no past medical history was brought to the ER via EMS for seizures. History was obtained from the patient and his wife. 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 At 10:30 AM he was talking to his wife in the kitchen when he suddenly fell onto the child safety gate, hitting his head and neck. Patient's wife noticed that he had generalized tonic-clonic shaking, he became stiff and his eyes rolled up, the whole episode lasted about 3-5 minutes. After he stopped taking he was not responsive. Paramedics had arrived and during the transportation he was noticed to be postictal. Patient's wife reported that he had another brief seizure episode in the ER . He was admitted to medical service and neurology consulted. MRI done and negative . EEG did not show epileptiform activity. He was started on dilantin.    Assessment/Plan: Principal Problem:   Seizure - No repeat episode - MRI brain negative, EEG negative for epileptiform activity.  - Given loading dose of fosphenytoin, started on oral Dilantin by neurology at 100mg  three times a day. Appreciate neurology recommendations.  - repeat dilantin level in am.   Active Problems:   Sprain of ankle, unspecified site - Continue pain control as needed naproxen as needed.     Dehydration - resolved with  Gentle hydration   Tongue bite:  - magic mouth wash with lidocaine ordered to help relieve the pain.  - chloraseptic as needed.   Mild rhabdomyolysis: - hydration and repeat levels in am.    DVT Prophylaxis: SCDs  Code Status: Full CODE STATUS  Family Communication: Discussed with patient and his wife at the bedside  Disposition: possible d/c as per neurology.     Subjective: Denies any specific complaints, no recurrence of  seizures, . Answered questions .  Objective: Weight change:   Intake/Output Summary (Last 24 hours) at 04/19/13 1858 Last data filed at 04/19/13 1803  Gross per 24 hour  Intake 1667.5 ml  Output    600 ml  Net 1067.5 ml   Blood pressure 160/95, pulse 79, temperature 98.5 F (36.9 C), temperature source Oral, resp. rate 16, height 5\' 11"  (1.803 m), weight 130.455 kg (287 lb 9.6 oz), SpO2 96.00%.  Physical Exam: General: Alert and awake, oriented x3, not in any acute distress. Difficulty speaking secondary to swelling and tenderness on the tongue HEENT: anicteric sclera, PERLA, EOMI, tongue swelling CVS: S1-S2 clear, no murmur rubs or gallops Chest: clear to auscultation bilaterally, no wheezing, rales or rhonchi Abdomen: soft nontender, nondistended, normal bowel sounds  Extremities: no cyanosis, clubbing or edema noted bilaterally Neuro: Cranial nerves II-XII intact, no focal neurological deficits  Lab Results: Basic Metabolic Panel:  Recent Labs Lab 04/16/13 1132 04/17/13 0335  NA 138 140  K 3.9 3.7  CL 100 104  CO2 23 21  GLUCOSE 115* 130*  BUN 15 11  CREATININE 1.06 1.06  CALCIUM 9.4 8.6   Liver Function Tests:  Recent Labs Lab 04/17/13 0335 04/19/13 1145  AST 49*  --   ALT 27  --   ALKPHOS 99  --   BILITOT 0.5  --   PROT 7.5  --   ALBUMIN 3.5 3.4*   No results found for this basename: LIPASE, AMYLASE,  in the  last 168 hours No results found for this basename: AMMONIA,  in the last 168 hours CBC:  Recent Labs Lab 04/17/13 0335 04/18/13 0250  WBC 14.1* 11.1*  HGB 14.7 14.2  HCT 43.3 41.4  MCV 80.8 81.5  PLT 215 219   Cardiac Enzymes:  Recent Labs Lab 04/18/13 0250  CKTOTAL 2683*   BNP: No components found with this basename: POCBNP,  CBG:  Recent Labs Lab 04/16/13 1119  GLUCAP 116*     Micro Results: Recent Results (from the past 240 hour(s))  MRSA PCR SCREENING     Status: None   Collection Time    04/16/13  5:25 PM       Result Value Range Status   MRSA by PCR NEGATIVE  NEGATIVE Final   Comment:            The GeneXpert MRSA Assay (FDA     approved for NASAL specimens     only), is one component of a     comprehensive MRSA colonization     surveillance program. It is not     intended to diagnose MRSA     infection nor to guide or     monitor treatment for     MRSA infections.    Studies/Results: Ct Head Wo Contrast  04/16/2013   CLINICAL DATA:  Fall status post seizure  EXAM: CT HEAD WITHOUT CONTRAST  CT CERVICAL SPINE WITHOUT CONTRAST  TECHNIQUE: Multidetector CT imaging of the head and cervical spine was performed following the standard protocol without intravenous contrast. Multiplanar CT image reconstructions of the cervical spine were also generated.  COMPARISON:  None.  FINDINGS: CT HEAD FINDINGS  No acute intracranial abnormality. Specifically, no hemorrhage, hydrocephalus, mass lesion, acute infarction, or significant intracranial injury. No acute calvarial abnormality. Visualized paranasal sinuses and mastoid air cells are patent.  CT CERVICAL SPINE FINDINGS  There is straightening with minimal reversal of the normal cervical lordosis. This may represent collar placement, muscle spasm and/or positioning. Disk spaces are normal and there is no significant disk degeneration. No spondylosis is identified and there is no spinal or foraminal stenosis. There is no prevertebral soft tissue thickening.  No fracture is identified in the cervical spine. No mass lesion is present.  IMPRESSION: 1. No evidence of focal acute intracranial abnormalities 2. Mild reversal of the normal cervical lordosis may reflect muscle spasm, collar placement, or positioning otherwise unremarkable CT of the cervical spine.   Electronically Signed   By: Salome HolmesHector  Cooper M.D.   On: 04/16/2013 12:33   Ct Cervical Spine Wo Contrast  04/16/2013   CLINICAL DATA:  Fall status post seizure  EXAM: CT HEAD WITHOUT CONTRAST  CT CERVICAL SPINE WITHOUT  CONTRAST  TECHNIQUE: Multidetector CT imaging of the head and cervical spine was performed following the standard protocol without intravenous contrast. Multiplanar CT image reconstructions of the cervical spine were also generated.  COMPARISON:  None.  FINDINGS: CT HEAD FINDINGS  No acute intracranial abnormality. Specifically, no hemorrhage, hydrocephalus, mass lesion, acute infarction, or significant intracranial injury. No acute calvarial abnormality. Visualized paranasal sinuses and mastoid air cells are patent.  CT CERVICAL SPINE FINDINGS  There is straightening with minimal reversal of the normal cervical lordosis. This may represent collar placement, muscle spasm and/or positioning. Disk spaces are normal and there is no significant disk degeneration. No spondylosis is identified and there is no spinal or foraminal stenosis. There is no prevertebral soft tissue thickening.  No fracture is identified in the cervical  spine. No mass lesion is present.  IMPRESSION: 1. No evidence of focal acute intracranial abnormalities 2. Mild reversal of the normal cervical lordosis may reflect muscle spasm, collar placement, or positioning otherwise unremarkable CT of the cervical spine.   Electronically Signed   By: Salome Holmes M.D.   On: 04/16/2013 12:33   Dg Chest Port 1 View  04/16/2013   CLINICAL DATA:  Seizure.  Chest pain.  EXAM: PORTABLE CHEST - 1 VIEW  COMPARISON:  None.  FINDINGS: Portable view of the chest demonstrates elevation of the right hemidiaphragm. Probable atelectasis at the right lung base. Otherwise, the lungs are clear. Heart and mediastinum are within normal limits. Trachea is midline.  IMPRESSION: Elevation of the right hemidiaphragm with volume loss. No focal airspace disease.   Electronically Signed   By: Richarda Overlie M.D.   On: 04/16/2013 12:17    Medications: Scheduled Meds: . naproxen  375 mg Oral TID WC  . phenytoin  100 mg Oral Custom      LOS: 3 days   Chyan Carnero M.D. [facility] 04/19/2013, 6:58 PM Pager: [phone]  If 7PM-7AM, please contact night-coverage [email or web address] Password [email or web address]

## 2013-04-19 NOTE — Progress Notes (Addendum)
MEDICATION RELATED CONSULT NOTE - INITIAL   Pharmacy Consult for Phenytoin Indication: New-onset Seizures   No Known Allergies  Patient Measurements: Height: 5\' 11"  (180.3 cm) Weight: 287 lb 9.6 oz (130.455 kg) IBW/kg (Calculated) : 75.3 Adjusted Body Weight: 92 kg   Vital Signs: Temp: 98.5 F (36.9 C) (02/10 1308) Temp src: Oral (02/10 1308) BP: 160/95 mmHg (02/10 1308) Pulse Rate: 79 (02/10 1308) Intake/Output from previous day: 02/09 0701 - 02/10 0700 In: 1318.3 [P.O.:240; I.V.:1078.3] Out: 700 [Urine:700] Intake/Output from this shift: Total I/O In: 240 [P.O.:240] Out: 250 [Urine:250]  Labs:  Recent Labs  04/17/13 0335 04/18/13 0250 04/19/13 1145  WBC 14.1* 11.1*  --   HGB 14.7 14.2  --   HCT 43.3 41.4  --   PLT 215 219  --   CREATININE 1.06  --   --   ALBUMIN 3.5  --  3.4*  PROT 7.5  --   --   AST 49*  --   --   ALT 27  --   --   ALKPHOS 99  --   --   BILITOT 0.5  --   --    Estimated Creatinine Clearance: 146.8 ml/min (by C-G formula based on Cr of 1.06).   Microbiology: Recent Results (from the past 720 hour(s))  MRSA PCR SCREENING     Status: None   Collection Time    04/16/13  5:25 PM      Result Value Range Status   MRSA by PCR NEGATIVE  NEGATIVE Final   Comment:            The GeneXpert MRSA Assay (FDA     approved for NASAL specimens     only), is one component of a     comprehensive MRSA colonization     surveillance program. It is not     intended to diagnose MRSA     infection nor to guide or     monitor treatment for     MRSA infections.    Medical History: Past Medical History  Diagnosis Date  . Hypertension     Medications:  Prescriptions prior to admission  Medication Sig Dispense Refill  . ibuprofen (ADVIL,MOTRIN) 800 MG tablet Take 800 mg by mouth every 8 (eight) hours as needed (pain).      Marland Kitchen. OVER THE COUNTER MEDICATION Take 2 tablets by mouth daily. Joint Gummies      . traMADol (ULTRAM) 50 MG tablet Take 50 mg by  mouth every 12 (twelve) hours as needed for moderate pain.        Assessment: 27 [facility]. Was given loading dose of Fosphenytoin in ED and started on phenytoin 100 mg three times daily. A level today was sub-therapeutic at ~ 5.1 (corrected for albumin). Will take approximately 5-7 days from start of therapy for phenytoin to fully accumulate. Pt was given 500 mg IV phenytoin today as a repeat load which seems appropriate. Adjusted BW = 92 kg.  Goal of Therapy:  Phenytoin level of 10-20    Plan:  1) Phenytoin 500 mg IV x 1 dose per PA-C orders 2) Continue phenytoin 100 mg three times daily  3) May recheck level in 24 hours after load, if subtherapeutic, consider an additional load based on levels 4) Would recommend holding off on increasing phenytoin maintenance dose for an additional 2-3 days till drug is fully accumulated.    Vinnie LevelBenjamin Laurene Melendrez, PharmD.  Clinical Pharmacist Pager [phone]

## 2013-04-19 NOTE — Progress Notes (Signed)
NEURO HOSPITALIST PROGRESS NOTE   SUBJECTIVE:                                                                                                                        Patient has had no further seizures.  He is feeling very drowsy and light headed when he stands up.   OBJECTIVE:                                                                                                                           Vital signs in last 24 hours: Temp:  [98.2 F (36.8 C)-99.3 F (37.4 C)] 98.2 F (36.8 C) (02/10 0556) Pulse Rate:  [75-98] 75 (02/10 0556) Resp:  [16-27] 16 (02/10 0556) BP: (123-139)/(70-81) 123/70 mmHg (02/10 0556) SpO2:  [91 %-98 %] 94 % (02/10 0556) Weight:  [130.455 kg (287 lb 9.6 oz)] 130.455 kg (287 lb 9.6 oz) (02/09 1415)  Intake/Output from previous day: 02/09 0701 - 02/10 0700 In: 1318.3 [P.O.:240; I.V.:1078.3] Out: 700 [Urine:700] Intake/Output this shift: Total I/O In: 240 [P.O.:240] Out: 250 [Urine:250] Nutritional status: Dysphagia  Past Medical History  Diagnosis Date  . Hypertension      Neurologic Exam:   Mental Status: Alert, oriented, thought content appropriate.  Speech fluent without evidence of aphasia.  Able to follow 3 step commands without difficulty. Cranial Nerves: II: Discs flat bilaterally; Visual fields grossly normal, pupils equal, round, reactive to light and accommodation III,IV, VI: ptosis not present, extra-ocular motions intact bilaterally V,VII: smile symmetric, facial light touch sensation normal bilaterally VIII: hearing normal bilaterally IX,X: gag reflex present XI: bilateral shoulder shrug XII: midline tongue extension without atrophy or fasciculations  Motor: Right : Upper extremity   5/5    Left:     Upper extremity   5/5  Lower extremity   5/5     Lower extremity   5/5 Tone and bulk:normal tone throughout; no atrophy noted Sensory: Pinprick and light touch intact throughout,  bilaterally Deep Tendon Reflexes:  Right: Upper Extremity   Left: Upper extremity   biceps (C-5 to C-6) 2/4   biceps (C-5 to C-6) 2/4 tricep (C7) 2/4    triceps (C7) 2/4 Brachioradialis (C6) 2/4  Brachioradialis (C6) 2/4  Lower Extremity  Lower Extremity  quadriceps (L-2 to L-4) 2/4   quadriceps (L-2 to L-4) 2/4 Achilles (S1) 2/4   Achilles (S1) 2/4  Plantars: Right: downgoing   Left: downgoing Cerebellar: normal finger-to-nose,  normal heel-to-shin test   Lab Results: Basic Metabolic Panel:  Recent Labs Lab 04/16/13 1132 04/17/13 0335  NA 138 140  K 3.9 3.7  CL 100 104  CO2 23 21  GLUCOSE 115* 130*  BUN 15 11  CREATININE 1.06 1.06  CALCIUM 9.4 8.6    Liver Function Tests:  Recent Labs Lab 04/17/13 0335  AST 49*  ALT 27  ALKPHOS 99  BILITOT 0.5  PROT 7.5  ALBUMIN 3.5   No results found for this basename: LIPASE, AMYLASE,  in the last 168 hours No results found for this basename: AMMONIA,  in the last 168 hours  CBC:  Recent Labs Lab 04/16/13 1132 04/17/13 0335 04/18/13 0250  WBC 12.2* 14.1* 11.1*  HGB 15.4 14.7 14.2  HCT 44.8 43.3 41.4  MCV 80.7 80.8 81.5  PLT 230 215 219    Cardiac Enzymes:  Recent Labs Lab 04/18/13 0250  CKTOTAL 2683*    Lipid Panel: No results found for this basename: CHOL, TRIG, HDL, CHOLHDL, VLDL, LDLCALC,  in the last 168 hours  CBG:  Recent Labs Lab 04/16/13 1119  GLUCAP 116*    Microbiology: Results for orders placed during the hospital encounter of 04/16/13  MRSA PCR SCREENING     Status: None   Collection Time    04/16/13  5:25 PM      Result Value Range Status   MRSA by PCR NEGATIVE  NEGATIVE Final   Comment:            The GeneXpert MRSA Assay (FDA     approved for NASAL specimens     only), is one component of a     comprehensive MRSA colonization     surveillance program. It is not     intended to diagnose MRSA     infection nor to guide or     monitor treatment for     MRSA infections.     Coagulation Studies: No results found for this basename: LABPROT, INR,  in the last 72 hours  Imaging: Mr Laqueta Jean Wo Contrast  04/18/2013   CLINICAL DATA:  28 year old male with seizure witnessed by spouse. Hypertension. Initial encounter.  EXAM: MRI HEAD WITHOUT AND WITH CONTRAST  TECHNIQUE: Multiplanar, multiecho pulse sequences of the brain and surrounding structures were obtained without and with intravenous contrast.  CONTRAST:  20mL MULTIHANCE GADOBENATE DIMEGLUMINE 529 MG/ML IV SOLN  COMPARISON:  Head and cervical spine CT 04/16/2013.  FINDINGS: Cerebral volume is normal. No restricted diffusion to suggest acute infarction. No midline shift, mass effect, evidence of mass lesion, ventriculomegaly, extra-axial collection or acute intracranial hemorrhage. Cervicomedullary junction and pituitary are within normal limits. Negative visualized cervical spine. Major intracranial vascular flow voids are preserved.  Mesial temporal lobe structures are within normal limits. Wallace Cullens and white matter signal is within normal limits throughout the brain. No abnormal enhancement identified.  Visible internal auditory structures appear normal. Mastoids are clear. Visualized bone marrow signal is within normal limits. Visualized orbit soft tissues are within normal limits. Mild mucosal thickening left frontal sinus, otherwise negative paranasal sinuses. Visualized scalp soft tissues are within normal limits. Visible cervical lymph nodes and tonsillar hypertrophy, within normal limits for age.  IMPRESSION: Normal MRI appearance of the brain.   Electronically Signed   By:  Augusto GambleLee  Hall M.D.   On: 04/18/2013 16:08    EEG Interpretation: This is a normal EEG recording during wakefulness and during drowsiness. No evidence of an epileptic disorder is demonstrated.    MEDICATIONS                                                                                                                        Scheduled: .  methocarbamol  500 mg Oral TID  . naproxen  375 mg Oral TID WC  . phenytoin  100 mg Oral Custom    ASSESSMENT/PLAN:                                                                                                            New-onset seizure activity. MRI brain normal and EEG showed no epileptiform activity. Will stop Robaxin due to excessive sleepiness. Ordered a follow up Dilantin level.  If therapeutic recommend discharging on Dilantin 300 mg QHS with out patient neurology follow up.   Will follow Dilantin level.    Assessment and plan discussed with with attending physician and they are in agreement.    Felicie MornDavid Smith PA-C [facility] [phone]  04/19/2013, 11:09 AM

## 2013-04-20 LAB — URINALYSIS, ROUTINE W REFLEX MICROSCOPIC
Bilirubin Urine: NEGATIVE
Glucose, UA: NEGATIVE mg/dL
HGB URINE DIPSTICK: NEGATIVE
KETONES UR: NEGATIVE mg/dL
LEUKOCYTES UA: NEGATIVE
Nitrite: NEGATIVE
PH: 7 (ref 5.0–8.0)
PROTEIN: NEGATIVE mg/dL
Specific Gravity, Urine: 1.011 (ref 1.005–1.030)
Urobilinogen, UA: 0.2 mg/dL (ref 0.0–1.0)

## 2013-04-20 LAB — CK: Total CK: 880 U/L — ABNORMAL HIGH (ref 7–232)

## 2013-04-20 MED ORDER — CLONAZEPAM 0.5 MG PO TABS
0.5000 mg | ORAL_TABLET | Freq: Two times a day (BID) | ORAL | Status: DC
  Administered 2013-04-20 – 2013-04-22 (×5): 0.5 mg via ORAL
  Filled 2013-04-20 (×5): qty 1

## 2013-04-20 MED ORDER — PHENYTOIN SODIUM EXTENDED 100 MG PO CAPS
200.0000 mg | ORAL_CAPSULE | Freq: Two times a day (BID) | ORAL | Status: DC
  Administered 2013-04-20 – 2013-04-21 (×2): 200 mg via ORAL
  Filled 2013-04-20 (×4): qty 2

## 2013-04-20 MED ORDER — PHENYTOIN SODIUM EXTENDED 100 MG PO CAPS
300.0000 mg | ORAL_CAPSULE | ORAL | Status: DC
Start: 2013-04-20 — End: 2013-04-20
  Filled 2013-04-20 (×3): qty 3

## 2013-04-20 MED ORDER — ENOXAPARIN SODIUM 40 MG/0.4ML ~~LOC~~ SOLN
40.0000 mg | SUBCUTANEOUS | Status: DC
  Filled 2013-04-20 (×3): qty 0.4

## 2013-04-20 MED ORDER — SODIUM CHLORIDE 0.9 % IV SOLN
1000.0000 mg | Freq: Once | INTRAVENOUS | Status: AC
  Administered 2013-04-20: 1000 mg via INTRAVENOUS
  Filled 2013-04-20: qty 20

## 2013-04-20 NOTE — Progress Notes (Signed)
Subjective: Patient has had no further seizure activity.  He feels more awake.  Infusion of Dilantin was started but patient feels his arm was tingly.  He is asking to have infusion given at a slower rate with a heating pad as this helped before. Last dilantin level corrected is 5.1.  Objective: Current vital signs: BP 128/88  Pulse 78  Temp(Src) 98.3 F (36.8 C) (Oral)  Resp 16  Ht 5\' 11"  (1.803 m)  Wt 130.455 kg (287 lb 9.6 oz)  BMI 40.13 kg/m2  SpO2 95% Vital signs in last 24 hours: Temp:  [98.3 F (36.8 C)-98.6 F (37 C)] 98.3 F (36.8 C) (02/11 0504) Pulse Rate:  [78-79] 78 (02/11 0504) Resp:  [16] 16 (02/11 0504) BP: (128-160)/(86-95) 128/88 mmHg (02/11 0504) SpO2:  [95 %-96 %] 95 % (02/11 0504)  Intake/Output from previous day: 02/10 0701 - 02/11 0700 In: 841.7 [P.O.:240; I.V.:601.7] Out: 925 [Urine:925] Intake/Output this shift: Total I/O In: -  Out: 600 [Urine:600] Nutritional status: Dysphagia  Neurologic Exam: Mental Status: Alert, oriented, thought content appropriate.  Speech fluent without evidence of aphasia.  Able to follow 3 step commands without difficulty. Cranial Nerves: II: Visual fields grossly normal, pupils equal, round, reactive to light and accommodation III,IV, VI: ptosis not present, extra-ocular motions intact bilaterally V,VII: smile symmetric, facial light touch sensation normal bilaterally VIII: hearing normal bilaterally IX,X: gag reflex present XI: bilateral shoulder shrug XII: midline tongue extension without atrophy or fasciculations  Motor: Right : Upper extremity   4/5    Left:     Upper extremity   5/5  Lower extremity   5/5     Lower extremity   5/5 --Pateint gives poor effort on formal testing of his right arm and moves extremity methodically and slowely Tone and bulk:normal tone throughout; no atrophy noted Sensory: Pinprick and light touch intact throughout, bilaterally Deep Tendon Reflexes:  Right: Upper Extremity    Left: Upper extremity   biceps (C-5 to C-6) 2/4   biceps (C-5 to C-6) 2/4 tricep (C7) 2/4    triceps (C7) 2/4 Brachioradialis (C6) 2/4  Brachioradialis (C6) 2/4  Lower Extremity Lower Extremity  quadriceps (L-2 to L-4) 2/4   quadriceps (L-2 to L-4) 2/4 Achilles (S1) 2/4   Achilles (S1) 2/4  Plantars: Right: downgoing   Left: downgoing Cerebellar: normal finger-to-nose,  normal heel-to-shin test    Lab Results: Basic Metabolic Panel:  Recent Labs Lab 04/16/13 1132 04/17/13 0335  NA 138 140  K 3.9 3.7  CL 100 104  CO2 23 21  GLUCOSE 115* 130*  BUN 15 11  CREATININE 1.06 1.06  CALCIUM 9.4 8.6    Liver Function Tests:  Recent Labs Lab 04/17/13 0335 04/19/13 1145  AST 49*  --   ALT 27  --   ALKPHOS 99  --   BILITOT 0.5  --   PROT 7.5  --   ALBUMIN 3.5 3.4*   No results found for this basename: LIPASE, AMYLASE,  in the last 168 hours No results found for this basename: AMMONIA,  in the last 168 hours  CBC:  Recent Labs Lab 04/16/13 1132 04/17/13 0335 04/18/13 0250  WBC 12.2* 14.1* 11.1*  HGB 15.4 14.7 14.2  HCT 44.8 43.3 41.4  MCV 80.7 80.8 81.5  PLT 230 215 219    Cardiac Enzymes:  Recent Labs Lab 04/18/13 0250 04/20/13 0452  CKTOTAL 2683* 880*    Lipid Panel: No results found for this basename: CHOL, TRIG, HDL, CHOLHDL,  VLDL, LDLCALC,  in the last 168 hours  CBG:  Recent Labs Lab 04/16/13 1119  GLUCAP 116*    Microbiology: Results for orders placed during the hospital encounter of 04/16/13  MRSA PCR SCREENING     Status: None   Collection Time    04/16/13  5:25 PM      Result Value Ref Range Status   MRSA by PCR NEGATIVE  NEGATIVE Final   Comment:            The GeneXpert MRSA Assay (FDA     approved for NASAL specimens     only), is one component of a     comprehensive MRSA colonization     surveillance program. It is not     intended to diagnose MRSA     infection nor to guide or     monitor treatment for     MRSA  infections.    Coagulation Studies: No results found for this basename: LABPROT, INR,  in the last 72 hours  Imaging: Mr Laqueta JeanBrain W Wo Contrast  04/18/2013   CLINICAL DATA:  28 year old male with seizure witnessed by spouse. Hypertension. Initial encounter.  EXAM: MRI HEAD WITHOUT AND WITH CONTRAST  TECHNIQUE: Multiplanar, multiecho pulse sequences of the brain and surrounding structures were obtained without and with intravenous contrast.  CONTRAST:  20mL MULTIHANCE GADOBENATE DIMEGLUMINE 529 MG/ML IV SOLN  COMPARISON:  Head and cervical spine CT 04/16/2013.  FINDINGS: Cerebral volume is normal. No restricted diffusion to suggest acute infarction. No midline shift, mass effect, evidence of mass lesion, ventriculomegaly, extra-axial collection or acute intracranial hemorrhage. Cervicomedullary junction and pituitary are within normal limits. Negative visualized cervical spine. Major intracranial vascular flow voids are preserved.  Mesial temporal lobe structures are within normal limits. Wallace CullensGray and white matter signal is within normal limits throughout the brain. No abnormal enhancement identified.  Visible internal auditory structures appear normal. Mastoids are clear. Visualized bone marrow signal is within normal limits. Visualized orbit soft tissues are within normal limits. Mild mucosal thickening left frontal sinus, otherwise negative paranasal sinuses. Visualized scalp soft tissues are within normal limits. Visible cervical lymph nodes and tonsillar hypertrophy, within normal limits for age.  IMPRESSION: Normal MRI appearance of the brain.   Electronically Signed   By: Augusto GambleLee  Hall M.D.   On: 04/18/2013 16:08    Medications:  Scheduled: . enoxaparin (LOVENOX) injection  40 mg Subcutaneous Q24H  . naproxen  375 mg Oral TID WC  . phenytoin  300 mg Oral 3 times per day    Assessment/Plan: 28 year old male with new onset seizure activity.  No etiology found.  Patient remains subtherapeutic on Dilantin.   Additional IV load given.  Maintenance increased to 900mg  a day.    Recommendations: 1.  Decrease maintenance to 200mg  BID 2.  No further IV Dilantin required. This may be causing the lightheadedness.   3.  Continue seizure precautions.      LOS: 4 days   Thana FarrLeslie Adaeze Better, MD [facility] [phone]  04/20/2013  12:03 PM

## 2013-04-20 NOTE — Progress Notes (Signed)
Patient Demographics  Peter Barr, is a 28 y.o. male, DOB - 10-22-1985, RUE:[id number]  Admit date - 04/16/2013   Admitting Physician Kathlen Mody, MD  Outpatient Primary MD for the patient is No PCP Per Patient  LOS - 4   Chief Complaint  Patient presents with  . Seizures        Assessment & Plan    Brief HPI:   Patient is a 28 year old male with no past medical history was brought to the ER via EMS for seizures. History was obtained from the patient and his wife. 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 At 10:30 AM he was talking to his wife in the kitchen when he suddenly fell onto the child safety gate, hitting his head and neck. Patient's wife noticed that he had generalized tonic-clonic shaking, he became stiff and his eyes rolled up, the whole episode lasted about 3-5 minutes. After he stopped taking he was not responsive. Paramedics had arrived and during the transportation he was noticed to be postictal. Patient's wife reported that he had another brief seizure episode in the ER . He was admitted to medical service and neurology consulted. MRI done and negative . EEG did not show epileptiform activity. He was started on dilantin.     Problems     Seizure  - No repeat episode  - MRI brain negative, EEG negative for epileptiform activity.  - Dilantin levels are still low, will give him a gram of IV Dilantin and increase his dose to 3 and a milligram daily, repeat Dilantin level in the morning. Neuro following.      Sprain of ankle, unspecified site  - Continue pain control as needed , naproxen as needed. Increase activity patient feels better.     Dehydration - resolved with Gentle hydration      Tongue bite:   magic mouth wash with lidocaine ordered to help relieve the pain,  chloraseptic as needed.      Mild rhabdomyolysis:  hydration , CK levels better        Code Status: Full code  Family Communication: Wife  Disposition Plan: Home   Procedures CT scan head and spine, MRI brain, EEG   Consults  Neuro   Medications  Scheduled Meds: . enoxaparin (LOVENOX) injection  40 mg Subcutaneous Q24H  . naproxen  375 mg Oral TID WC  . phenytoin (DILANTIN) IV  1,000 mg Intravenous Once  . phenytoin  300 mg Oral 3 times per day   Continuous Infusions:  PRN Meds:.acetaminophen, acetaminophen, HYDROcodone-acetaminophen, HYDROmorphone (DILAUDID) injection, LORazepam, magic mouthwash w/lidocaine, ondansetron (ZOFRAN) IV, ondansetron, phenol  DVT Prophylaxis  Lovenox    Lab Results  Component Value Date   PLT 219 04/18/2013    Antibiotics     Anti-infectives   None          Subjective:   Peter Barr today has, No headache, No chest pain, No abdominal pain - No Nausea, No new weakness tingling or numbness, No Cough - SOB.    Objective:   Filed Vitals:   04/19/13 0556 04/19/13 1308 04/19/13 2045 04/20/13 0504  BP: 123/70 160/95 142/86 128/88  Pulse: 75 79  79 78  Temp: 98.2 F (36.8 C) 98.5 F (36.9 C) 98.6 F (37 C) 98.3 F (36.8 C)  TempSrc: Oral Oral Oral Oral  Resp: 16 16 16 16   Height:      Weight:      SpO2: 94% 96% 95% 95%    Wt Readings from Last 3 Encounters:  04/18/13 130.455 kg (287 lb 9.6 oz)  12/12/12 126.236 kg (278 lb 4.8 oz)  04/23/12 123.378 kg (272 lb)     Intake/Output Summary (Last 24 hours) at 04/20/13 0859 Last data filed at 04/20/13 0505  Gross per 24 hour  Intake 841.67 ml  Output    675 ml  Net 166.67 ml     Physical Exam  Awake Alert, Oriented X 3, No new F.N deficits, Normal affect Carrizo.AT,PERRAL Supple Neck,No JVD, No cervical lymphadenopathy appriciated.  Symmetrical Chest wall movement, Good air movement bilaterally,  CTAB RRR,No Gallops,Rubs or new Murmurs, No Parasternal Heave +ve B.Sounds, Abd Soft, Non tender, No organomegaly appriciated, No rebound - guarding or rigidity. No Cyanosis, Clubbing or edema, No new Rash or bruise      Data Review   Micro Results Recent Results (from the past 240 hour(s))  MRSA PCR SCREENING     Status: None   Collection Time    04/16/13  5:25 PM      Result Value Ref Range Status   MRSA by PCR NEGATIVE  NEGATIVE Final   Comment:            The GeneXpert MRSA Assay (FDA     approved for NASAL specimens     only), is one component of a     comprehensive MRSA colonization     surveillance program. It is not     intended to diagnose MRSA     infection nor to guide or     monitor treatment for     MRSA infections.    Radiology Reports Ct Head Wo Contrast  04/16/2013   CLINICAL DATA:  Fall status post seizure  EXAM: CT HEAD WITHOUT CONTRAST  CT CERVICAL SPINE WITHOUT CONTRAST  TECHNIQUE: Multidetector CT imaging of the head and cervical spine was performed following the standard protocol without intravenous contrast. Multiplanar CT image reconstructions of the cervical spine were also generated.  COMPARISON:  None.  FINDINGS: CT HEAD FINDINGS  No acute intracranial abnormality. Specifically, no hemorrhage, hydrocephalus, mass lesion, acute infarction, or significant intracranial injury. No acute calvarial abnormality. Visualized paranasal sinuses and mastoid air cells are patent.  CT CERVICAL SPINE FINDINGS  There is straightening with minimal reversal of the normal cervical lordosis. This may represent collar placement, muscle spasm and/or positioning. Disk spaces are normal and there is no significant disk degeneration. No spondylosis is identified and there is no spinal or foraminal stenosis. There is no prevertebral soft tissue thickening.  No fracture is identified in the cervical spine. No mass lesion is present.  IMPRESSION: 1. No evidence of focal acute intracranial  abnormalities 2. Mild reversal of the normal cervical lordosis may reflect muscle spasm, collar placement, or positioning otherwise unremarkable CT of the cervical spine.   Electronically Signed   By: Salome Holmes M.D.   On: 04/16/2013 12:33   Ct Cervical Spine Wo Contrast  04/16/2013   CLINICAL DATA:  Fall status post seizure  EXAM: CT HEAD WITHOUT CONTRAST  CT CERVICAL SPINE WITHOUT CONTRAST  TECHNIQUE: Multidetector CT imaging of the head and cervical spine was performed following the standard protocol without  intravenous contrast. Multiplanar CT image reconstructions of the cervical spine were also generated.  COMPARISON:  None.  FINDINGS: CT HEAD FINDINGS  No acute intracranial abnormality. Specifically, no hemorrhage, hydrocephalus, mass lesion, acute infarction, or significant intracranial injury. No acute calvarial abnormality. Visualized paranasal sinuses and mastoid air cells are patent.  CT CERVICAL SPINE FINDINGS  There is straightening with minimal reversal of the normal cervical lordosis. This may represent collar placement, muscle spasm and/or positioning. Disk spaces are normal and there is no significant disk degeneration. No spondylosis is identified and there is no spinal or foraminal stenosis. There is no prevertebral soft tissue thickening.  No fracture is identified in the cervical spine. No mass lesion is present.  IMPRESSION: 1. No evidence of focal acute intracranial abnormalities 2. Mild reversal of the normal cervical lordosis may reflect muscle spasm, collar placement, or positioning otherwise unremarkable CT of the cervical spine.   Electronically Signed   By: Salome HolmesHector  Cooper M.D.   On: 04/16/2013 12:33   Mr Laqueta JeanBrain W ZOWo Contrast  04/18/2013   CLINICAL DATA:  28 year old male with seizure witnessed by spouse. Hypertension. Initial encounter.  EXAM: MRI HEAD WITHOUT AND WITH CONTRAST  TECHNIQUE: Multiplanar, multiecho pulse sequences of the brain and surrounding structures were  obtained without and with intravenous contrast.  CONTRAST:  20mL MULTIHANCE GADOBENATE DIMEGLUMINE 529 MG/ML IV SOLN  COMPARISON:  Head and cervical spine CT 04/16/2013.  FINDINGS: Cerebral volume is normal. No restricted diffusion to suggest acute infarction. No midline shift, mass effect, evidence of mass lesion, ventriculomegaly, extra-axial collection or acute intracranial hemorrhage. Cervicomedullary junction and pituitary are within normal limits. Negative visualized cervical spine. Major intracranial vascular flow voids are preserved.  Mesial temporal lobe structures are within normal limits. Wallace CullensGray and white matter signal is within normal limits throughout the brain. No abnormal enhancement identified.  Visible internal auditory structures appear normal. Mastoids are clear. Visualized bone marrow signal is within normal limits. Visualized orbit soft tissues are within normal limits. Mild mucosal thickening left frontal sinus, otherwise negative paranasal sinuses. Visualized scalp soft tissues are within normal limits. Visible cervical lymph nodes and tonsillar hypertrophy, within normal limits for age.  IMPRESSION: Normal MRI appearance of the brain.   Electronically Signed   By: Augusto GambleLee  Hall M.D.   On: 04/18/2013 16:08   Dg Chest Port 1 View  04/16/2013   CLINICAL DATA:  Seizure.  Chest pain.  EXAM: PORTABLE CHEST - 1 VIEW  COMPARISON:  None.  FINDINGS: Portable view of the chest demonstrates elevation of the right hemidiaphragm. Probable atelectasis at the right lung base. Otherwise, the lungs are clear. Heart and mediastinum are within normal limits. Trachea is midline.  IMPRESSION: Elevation of the right hemidiaphragm with volume loss. No focal airspace disease.   Electronically Signed   By: Richarda OverlieAdam  Henn M.D.   On: 04/16/2013 12:17    CBC  Recent Labs Lab 04/16/13 1132 04/17/13 0335 04/18/13 0250  WBC 12.2* 14.1* 11.1*  HGB 15.4 14.7 14.2  HCT 44.8 43.3 41.4  PLT 230 215 219  MCV 80.7 80.8 81.5    MCH 27.7 27.4 28.0  MCHC 34.4 33.9 34.3  RDW 13.5 13.7 13.6    Chemistries   Recent Labs Lab 04/16/13 1132 04/17/13 0335  NA 138 140  K 3.9 3.7  CL 100 104  CO2 23 21  GLUCOSE 115* 130*  BUN 15 11  CREATININE 1.06 1.06  CALCIUM 9.4 8.6  AST  --  49*  ALT  --  27  ALKPHOS  --  99  BILITOT  --  0.5   ------------------------------------------------------------------------------------------------------------------ estimated creatinine clearance is 146.8 ml/min (by C-G formula based on Cr of 1.06). ------------------------------------------------------------------------------------------------------------------ No results found for this basename: HGBA1C,  in the last 72 hours ------------------------------------------------------------------------------------------------------------------ No results found for this basename: CHOL, HDL, LDLCALC, TRIG, CHOLHDL, LDLDIRECT,  in the last 72 hours ------------------------------------------------------------------------------------------------------------------ No results found for this basename: TSH, T4TOTAL, FREET3, T3FREE, THYROIDAB,  in the last 72 hours ------------------------------------------------------------------------------------------------------------------ No results found for this basename: VITAMINB12, FOLATE, FERRITIN, TIBC, IRON, RETICCTPCT,  in the last 72 hours  Coagulation profile No results found for this basename: INR, PROTIME,  in the last 168 hours  No results found for this basename: DDIMER,  in the last 72 hours  Cardiac Enzymes No results found for this basename: CK, CKMB, TROPONINI, MYOGLOBIN,  in the last 168 hours ------------------------------------------------------------------------------------------------------------------ No components found with this basename: POCBNP,      Time Spent in minutes   35   Sissi Padia K M.D on 04/20/2013 at 8:59 AM  Between 7am to 7pm - Pager -  [phone]  After 7pm go to [email or web address] - password [email or web address]  And look for the night coverage person covering for me after hours  [facility] Office  [phone]

## 2013-04-20 NOTE — Progress Notes (Signed)
MEDICATION RELATED CONSULT NOTE - INITIAL   Pharmacy Consult for Phenytoin Indication: New-onset Seizures   No Known Allergies  Patient Measurements: Height: 5\' 11"  (180.3 cm) Weight: 287 lb 9.6 oz (130.455 kg) IBW/kg (Calculated) : 75.3 Adjusted Body Weight: 92 kg   Vital Signs: Temp: 98.6 F (37 C) (02/11 1318) Temp src: Oral (02/11 1318) BP: 132/87 mmHg (02/11 1318) Pulse Rate: 94 (02/11 1318) Intake/Output from previous day: 02/10 0701 - 02/11 0700 In: 841.7 [P.O.:240; I.V.:601.7] Out: 925 [Urine:925] Intake/Output from this shift: Total I/O In: -  Out: 600 [Urine:600]  Labs:  Recent Labs  04/18/13 0250 04/19/13 1145  WBC 11.1*  --   HGB 14.2  --   HCT 41.4  --   PLT 219  --   ALBUMIN  --  3.4*   Estimated Creatinine Clearance: 146.8 ml/min (by C-G formula based on Cr of 1.06).   Microbiology: Recent Results (from the past 720 hour(s))  MRSA PCR SCREENING     Status: None   Collection Time    04/16/13  5:25 PM      Result Value Ref Range Status   MRSA by PCR NEGATIVE  NEGATIVE Final   Comment:            The GeneXpert MRSA Assay (FDA     approved for NASAL specimens     only), is one component of a     comprehensive MRSA colonization     surveillance program. It is not     intended to diagnose MRSA     infection nor to guide or     monitor treatment for     MRSA infections.    Medical History: Past Medical History  Diagnosis Date  . Hypertension     Medications:  Prescriptions prior to admission  Medication Sig Dispense Refill  . ibuprofen (ADVIL,MOTRIN) 800 MG tablet Take 800 mg by mouth every 8 (eight) hours as needed (pain).      Marland Kitchen. OVER THE COUNTER MEDICATION Take 2 tablets by mouth daily. Joint Gummies      . traMADol (ULTRAM) 50 MG tablet Take 50 mg by mouth every 12 (twelve) hours as needed for moderate pain.        Assessment: 27 [facility]. Was given loading dose of Fosphenytoin in ED and  started on phenytoin 100 mg three times daily. A level yesterday was sub-therapeutic at ~ 5.1 (corrected for albumin). He was given a 500 mg IV load yesterday and an additional 1 gm load was ordered by hospitalist today. Maintenance dose was increased to 200 mg twice daily. Will check a Dilantin level tomorrow to check if load was effective. Patient has not experienced any seizures today.   Goal of Therapy:  Phenytoin level of 10-20    Plan:  1) F/u Dilantin level tomorrow.  2) Continue phenytoin 200 mg twice daily.  3) Would recommend checking another phenytoin level in 5-7 days to allow drug concentrations to reach steady state.  4) Monitor for s/s of seizures or phenytoin-related adverse reactions.    Vinnie LevelBenjamin Chenee Munns, PharmD.  Clinical Pharmacist Pager [phone]

## 2013-04-20 NOTE — Progress Notes (Signed)
Pt state burning on urination - physician notified. Pt in no s/s of distress.

## 2013-04-21 LAB — PHENYTOIN LEVEL, TOTAL: PHENYTOIN LVL: 17.8 ug/mL (ref 10.0–20.0)

## 2013-04-21 MED ORDER — LACOSAMIDE 200 MG/20ML IV SOLN
200.0000 mg | Freq: Once | INTRAVENOUS | Status: AC
  Administered 2013-04-21: 200 mg via INTRAVENOUS
  Filled 2013-04-21: qty 20

## 2013-04-21 MED ORDER — LACOSAMIDE 50 MG PO TABS
100.0000 mg | ORAL_TABLET | Freq: Two times a day (BID) | ORAL | Status: DC
  Administered 2013-04-21 – 2013-04-22 (×2): 100 mg via ORAL
  Filled 2013-04-21 (×2): qty 2

## 2013-04-21 MED ORDER — HYDROCODONE-ACETAMINOPHEN 5-325 MG PO TABS
1.0000 | ORAL_TABLET | Freq: Four times a day (QID) | ORAL | Status: DC | PRN
  Administered 2013-04-21 – 2013-04-22 (×2): 1 via ORAL
  Filled 2013-04-21 (×2): qty 1

## 2013-04-21 NOTE — Care Management Note (Addendum)
    Page 1 of 2   04/22/2013     2:11:38 PM   CARE MANAGEMENT NOTE 04/22/2013  Patient:  Peter Barr,Peter Barr   Account Number:  [id number]  Date Initiated:  04/18/2013  Documentation initiated by:  [facility]AVIS,ALESIA  Subjective/Objective Assessment:   seizures     Action/Plan:   Anticipated DC Date:  04/22/2013   Anticipated DC Plan:  HOME W HOME HEALTH SERVICES      DC Planning Services  CM consult      [facility]   Choice offered to / List presented to:  C-1 Patient        HH arranged  HH-2 PT  HH-1 RN  HH-5 SPEECH THERAPY      HH agency  [facility]   Status of service:  Completed, signed off Medicare Important Message given?   (If response is "NO", the following Medicare IM given date fields will be blank) Date Medicare IM given:   Date Additional Medicare IM given:    Discharge Disposition:  HOME W HOME HEALTH SERVICES  Per UR Regulation:  Reviewed for med. necessity/level of care/duration of stay  If discussed at Long Length of Stay Meetings, dates discussed:   04/21/2013    Comments:  04/22/13 1236 Letha Capeeborah Amer Alcindor RN, BSN [phone] patient for dc today, [facility], patient stated his wife brought a rolling walker for him.  [facility] , Lupita LeashDonna notified.  Soc will begin 24-48 hrs post discharge.  04/21/13 Letha Capeeborah Onie Hayashi RN, BSN [phone]  patient received dilantin infusion on 2/11, dialntin changed to orals on 2/11, no dilantin dc'd and vimpat started.  NCM will continue to follow for dc needs.

## 2013-04-21 NOTE — Progress Notes (Signed)
Subjective: Patient awake and alert.  Continues to complain of dizziness when standing.  Reports that he almost fell in the room and must now walk with a walker.  Prior to admission was independent in ambulation.  No further seizures have been noted.    Objective: Current vital signs: BP 123/85  Pulse 76  Temp(Src) 98.1 F (36.7 C) (Oral)  Resp 16  Ht 5\' 11"  (1.803 m)  Wt 130.455 kg (287 lb 9.6 oz)  BMI 40.13 kg/m2  SpO2 96% Vital signs in last 24 hours: Temp:  [98.1 F (36.7 C)-98.3 F (36.8 C)] 98.1 F (36.7 C) (02/12 0533) Pulse Rate:  [76-85] 76 (02/12 0533) Resp:  [16] 16 (02/12 0533) BP: (123-126)/(85) 123/85 mmHg (02/12 0533) SpO2:  [94 %-96 %] 96 % (02/12 0533)  Intake/Output from previous day: 02/11 0701 - 02/12 0700 In: 240 [P.O.:240] Out: 900 [Urine:900] Intake/Output this shift: Total I/O In: -  Out: 200 [Urine:200] Nutritional status: Dysphagia  Neurologic Exam: Mental Status:  Alert, oriented, thought content appropriate. Speech fluent without evidence of aphasia. Able to follow 3 step commands without difficulty.  Cranial Nerves:  II: Visual fields grossly normal, pupils equal, round, reactive to light and accommodation  III,IV, VI: ptosis not present, extra-ocular motions intact bilaterally with nystagmus on bilateral lateral gaze  V,VII: smile symmetric, facial light touch sensation normal bilaterally  VIII: hearing normal bilaterally  IX,X: gag reflex present  XI: bilateral shoulder shrug  XII: midline tongue extension without atrophy or fasciculations  Motor:  5/5 throughout Sensory: Pinprick and light touch intact throughout, bilaterally  Deep Tendon Reflexes:  2+ throuhgout Plantars:  Right: downgoing   Left: downgoing  Cerebellar:  normal finger-to-nose, normal heel-to-shin test   Lab Results: Basic Metabolic Panel:  Recent Labs Lab 04/16/13 1132 04/17/13 0335  NA 138 140  K 3.9 3.7  CL 100 104  CO2 23 21  GLUCOSE 115* 130*   BUN 15 11  CREATININE 1.06 1.06  CALCIUM 9.4 8.6    Liver Function Tests:  Recent Labs Lab 04/17/13 0335 04/19/13 1145  AST 49*  --   ALT 27  --   ALKPHOS 99  --   BILITOT 0.5  --   PROT 7.5  --   ALBUMIN 3.5 3.4*   No results found for this basename: LIPASE, AMYLASE,  in the last 168 hours No results found for this basename: AMMONIA,  in the last 168 hours  CBC:  Recent Labs Lab 04/16/13 1132 04/17/13 0335 04/18/13 0250  WBC 12.2* 14.1* 11.1*  HGB 15.4 14.7 14.2  HCT 44.8 43.3 41.4  MCV 80.7 80.8 81.5  PLT 230 215 219    Cardiac Enzymes:  Recent Labs Lab 04/18/13 0250 04/20/13 0452  CKTOTAL 2683* 880*    Lipid Panel: No results found for this basename: CHOL, TRIG, HDL, CHOLHDL, VLDL, LDLCALC,  in the last 168 hours  CBG:  Recent Labs Lab 04/16/13 1119  GLUCAP 116*    Microbiology: Results for orders placed during the hospital encounter of 04/16/13  MRSA PCR SCREENING     Status: None   Collection Time    04/16/13  5:25 PM      Result Value Ref Range Status   MRSA by PCR NEGATIVE  NEGATIVE Final   Comment:            The GeneXpert MRSA Assay (FDA     approved for NASAL specimens     only), is one component of a  comprehensive MRSA colonization     surveillance program. It is not     intended to diagnose MRSA     infection nor to guide or     monitor treatment for     MRSA infections.    Coagulation Studies: No results found for this basename: LABPROT, INR,  in the last 72 hours  Imaging: No results found.  Medications:  I have reviewed the patient's current medications. Scheduled: . clonazePAM  0.5 mg Oral BID  . enoxaparin (LOVENOX) injection  40 mg Subcutaneous Q24H  . lacosamide  100 mg Oral BID  . naproxen  375 mg Oral TID WC    Assessment/Plan: Patient without further seizure activity but continues to complain of dizziness.  Having difficulty with gait.  Dilantin level today of 17.8.  Patient may be sensitive to  Dilantin.  AED change in order.    Recommendations: 1.  Vimpat load of 200mg  IV with maintenance of 100mg  po BID 2.  D/C Dilantin.  No further levels required.   3.  Continue seizure precautions   LOS: 5 days   Thana FarrLeslie Jacob Cicero, MD [facility] [phone] 04/21/2013  1:19 PM

## 2013-04-21 NOTE — Progress Notes (Signed)
Patient Demographics  Peter Barr, is a 28 y.o. male, DOB - [birth year], ZOX:[id number]  Admit date - 04/16/2013   Admitting Physician Kathlen Mody, MD  Outpatient Primary MD for the patient is No PCP Per Patient  LOS - 5   Chief Complaint  Patient presents with  . Seizures        Assessment & Plan    Brief HPI:   Patient is a 28 year old male with no past medical history was brought to the ER via EMS for seizures. History was obtained from the patient and his wife. 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 At 10:30 AM he was talking to his wife in the kitchen when he suddenly fell onto the child safety gate, hitting his head and neck. Patient's wife noticed that he had generalized tonic-clonic shaking, he became stiff and his eyes rolled up, the whole episode lasted about 3-5 minutes. After he stopped taking he was not responsive. Paramedics had arrived and during the transportation he was noticed to be postictal. Patient's wife reported that he had another brief seizure episode in the ER . He was admitted to medical service and neurology consulted. MRI done and negative . EEG did not show epileptiform activity. He was started on dilantin but developed side effects of dizziness on Dilantin, neurology has stopped Dilantin and placed him on Vimpat. PT following.    Problems     Seizure  - No repeat episode  - MRI brain negative, EEG negative for epileptiform activity.  - Dilantin levels are theraputic, I would developed dizziness and vertigo from Dilantin, seen by neurology today and switch to Vimpat, will get a loading dose today thereafter be placed on oral Vimpat, the discharge in the morning.      Sprain of ankle, unspecified  site  - Continue pain control as needed , naproxen as needed. Increase activity patient feels better.     Dehydration - resolved with Gentle hydration      Tongue bite:  magic mouth wash with lidocaine ordered to help relieve the pain,  chloraseptic as needed.      Mild rhabdomyolysis:  hydration , CK levels better        Code Status: Full code  Family Communication: Wife  Disposition Plan: Home   Procedures CT scan head and spine, MRI brain, EEG   Consults  Neuro   Medications  Scheduled Meds: . clonazePAM  0.5 mg Oral BID  . enoxaparin (LOVENOX) injection  40 mg Subcutaneous Q24H  . lacosamide (VIMPAT) IV  200 mg Intravenous Once  . lacosamide  100 mg Oral BID  . naproxen  375 mg Oral TID WC   Continuous Infusions:  PRN Meds:.acetaminophen, HYDROcodone-acetaminophen, magic mouthwash w/lidocaine, ondansetron (ZOFRAN) IV, phenol  DVT Prophylaxis  Lovenox    Lab Results  Component Value Date   PLT 219 04/18/2013    Antibiotics     Anti-infectives   None          Subjective:   Peter Barr today has, No headache, No chest pain, No abdominal pain - No Nausea, No new weakness tingling or numbness, No Cough - SOB.    Objective:  Filed Vitals:   04/20/13 0504 04/20/13 1318 04/20/13 2041 04/21/13 0533  BP: 128/88 132/87 126/85 123/85  Pulse: 78 94 85 76  Temp: 98.3 F (36.8 C) 98.6 F (37 C) 98.3 F (36.8 C) 98.1 F (36.7 C)  TempSrc: Oral Oral Oral Oral  Resp: 16 16 16 16   Height:      Weight:      SpO2: 95% 96% 94% 96%    Wt Readings from Last 3 Encounters:  04/18/13 130.455 kg (287 lb 9.6 oz)  12/12/12 126.236 kg (278 lb 4.8 oz)  04/23/12 123.378 kg (272 lb)     Intake/Output Summary (Last 24 hours) at 04/21/13 1124 Last data filed at 04/20/13 2142  Gross per 24 hour  Intake    240 ml  Output    300 ml  Net    -60 ml     Physical Exam  Awake Alert, Oriented X 3, No new F.N deficits, Normal affect .AT,PERRAL,  small tongue bite site stable Supple Neck,No JVD, No cervical lymphadenopathy appriciated.  Symmetrical Chest wall movement, Good air movement bilaterally, CTAB RRR,No Gallops,Rubs or new Murmurs, No Parasternal Heave +ve B.Sounds, Abd Soft, Non tender, No organomegaly appriciated, No rebound - guarding or rigidity. No Cyanosis, Clubbing or edema, No new Rash or bruise      Data Review   Micro Results Recent Results (from the past 240 hour(s))  MRSA PCR SCREENING     Status: None   Collection Time    04/16/13  5:25 PM      Result Value Ref Range Status   MRSA by PCR NEGATIVE  NEGATIVE Final   Comment:            The GeneXpert MRSA Assay (FDA     approved for NASAL specimens     only), is one component of a     comprehensive MRSA colonization     surveillance program. It is not     intended to diagnose MRSA     infection nor to guide or     monitor treatment for     MRSA infections.    Radiology Reports Ct Head Wo Contrast  04/16/2013   CLINICAL DATA:  Fall status post seizure  EXAM: CT HEAD WITHOUT CONTRAST  CT CERVICAL SPINE WITHOUT CONTRAST  TECHNIQUE: Multidetector CT imaging of the head and cervical spine was performed following the standard protocol without intravenous contrast. Multiplanar CT image reconstructions of the cervical spine were also generated.  COMPARISON:  None.  FINDINGS: CT HEAD FINDINGS  No acute intracranial abnormality. Specifically, no hemorrhage, hydrocephalus, mass lesion, acute infarction, or significant intracranial injury. No acute calvarial abnormality. Visualized paranasal sinuses and mastoid air cells are patent.  CT CERVICAL SPINE FINDINGS  There is straightening with minimal reversal of the normal cervical lordosis. This may represent collar placement, muscle spasm and/or positioning. Disk spaces are normal and there is no significant disk degeneration. No spondylosis is identified and there is no spinal or foraminal stenosis. There is no prevertebral  soft tissue thickening.  No fracture is identified in the cervical spine. No mass lesion is present.  IMPRESSION: 1. No evidence of focal acute intracranial abnormalities 2. Mild reversal of the normal cervical lordosis may reflect muscle spasm, collar placement, or positioning otherwise unremarkable CT of the cervical spine.   Electronically Signed   By: Salome HolmesHector  Cooper M.D.   On: 04/16/2013 12:33   Ct Cervical Spine Wo Contrast  04/16/2013   CLINICAL DATA:  Fall  status post seizure  EXAM: CT HEAD WITHOUT CONTRAST  CT CERVICAL SPINE WITHOUT CONTRAST  TECHNIQUE: Multidetector CT imaging of the head and cervical spine was performed following the standard protocol without intravenous contrast. Multiplanar CT image reconstructions of the cervical spine were also generated.  COMPARISON:  None.  FINDINGS: CT HEAD FINDINGS  No acute intracranial abnormality. Specifically, no hemorrhage, hydrocephalus, mass lesion, acute infarction, or significant intracranial injury. No acute calvarial abnormality. Visualized paranasal sinuses and mastoid air cells are patent.  CT CERVICAL SPINE FINDINGS  There is straightening with minimal reversal of the normal cervical lordosis. This may represent collar placement, muscle spasm and/or positioning. Disk spaces are normal and there is no significant disk degeneration. No spondylosis is identified and there is no spinal or foraminal stenosis. There is no prevertebral soft tissue thickening.  No fracture is identified in the cervical spine. No mass lesion is present.  IMPRESSION: 1. No evidence of focal acute intracranial abnormalities 2. Mild reversal of the normal cervical lordosis may reflect muscle spasm, collar placement, or positioning otherwise unremarkable CT of the cervical spine.   Electronically Signed   By: Salome Holmes M.D.   On: 04/16/2013 12:33   Mr Laqueta Jean ZO Contrast  04/18/2013   CLINICAL DATA:  28 year old male with seizure witnessed by spouse. Hypertension. Initial  encounter.  EXAM: MRI HEAD WITHOUT AND WITH CONTRAST  TECHNIQUE: Multiplanar, multiecho pulse sequences of the brain and surrounding structures were obtained without and with intravenous contrast.  CONTRAST:  20mL MULTIHANCE GADOBENATE DIMEGLUMINE 529 MG/ML IV SOLN  COMPARISON:  Head and cervical spine CT 04/16/2013.  FINDINGS: Cerebral volume is normal. No restricted diffusion to suggest acute infarction. No midline shift, mass effect, evidence of mass lesion, ventriculomegaly, extra-axial collection or acute intracranial hemorrhage. Cervicomedullary junction and pituitary are within normal limits. Negative visualized cervical spine. Major intracranial vascular flow voids are preserved.  Mesial temporal lobe structures are within normal limits. Wallace Cullens and white matter signal is within normal limits throughout the brain. No abnormal enhancement identified.  Visible internal auditory structures appear normal. Mastoids are clear. Visualized bone marrow signal is within normal limits. Visualized orbit soft tissues are within normal limits. Mild mucosal thickening left frontal sinus, otherwise negative paranasal sinuses. Visualized scalp soft tissues are within normal limits. Visible cervical lymph nodes and tonsillar hypertrophy, within normal limits for age.  IMPRESSION: Normal MRI appearance of the brain.   Electronically Signed   By: Augusto Gamble M.D.   On: 04/18/2013 16:08   Dg Chest Port 1 View  04/16/2013   CLINICAL DATA:  Seizure.  Chest pain.  EXAM: PORTABLE CHEST - 1 VIEW  COMPARISON:  None.  FINDINGS: Portable view of the chest demonstrates elevation of the right hemidiaphragm. Probable atelectasis at the right lung base. Otherwise, the lungs are clear. Heart and mediastinum are within normal limits. Trachea is midline.  IMPRESSION: Elevation of the right hemidiaphragm with volume loss. No focal airspace disease.   Electronically Signed   By: Richarda Overlie M.D.   On: 04/16/2013 12:17    CBC  Recent Labs Lab  04/16/13 1132 04/17/13 0335 04/18/13 0250  WBC 12.2* 14.1* 11.1*  HGB 15.4 14.7 14.2  HCT 44.8 43.3 41.4  PLT 230 215 219  MCV 80.7 80.8 81.5  MCH 27.7 27.4 28.0  MCHC 34.4 33.9 34.3  RDW 13.5 13.7 13.6    Chemistries   Recent Labs Lab 04/16/13 1132 04/17/13 0335  NA 138 140  K 3.9 3.7  CL 100 104  CO2 23 21  GLUCOSE 115* 130*  BUN 15 11  CREATININE 1.06 1.06  CALCIUM 9.4 8.6  AST  --  49*  ALT  --  27  ALKPHOS  --  99  BILITOT  --  0.5   ------------------------------------------------------------------------------------------------------------------ estimated creatinine clearance is 146.8 ml/min (by C-G formula based on Cr of 1.06). ------------------------------------------------------------------------------------------------------------------ No results found for this basename: HGBA1C,  in the last 72 hours ------------------------------------------------------------------------------------------------------------------ No results found for this basename: CHOL, HDL, LDLCALC, TRIG, CHOLHDL, LDLDIRECT,  in the last 72 hours ------------------------------------------------------------------------------------------------------------------ No results found for this basename: TSH, T4TOTAL, FREET3, T3FREE, THYROIDAB,  in the last 72 hours ------------------------------------------------------------------------------------------------------------------ No results found for this basename: VITAMINB12, FOLATE, FERRITIN, TIBC, IRON, RETICCTPCT,  in the last 72 hours  Coagulation profile No results found for this basename: INR, PROTIME,  in the last 168 hours  No results found for this basename: DDIMER,  in the last 72 hours  Cardiac Enzymes No results found for this basename: CK, CKMB, TROPONINI, MYOGLOBIN,  in the last 168 hours ------------------------------------------------------------------------------------------------------------------ No components found with this  basename: POCBNP,      Time Spent in minutes   35   Susa Raring K M.D on 04/21/2013 at 11:24 AM  Between 7am to 7pm - Pager - [phone]  After 7pm go to [email or web address] - password [email or web address]  And look for the night coverage person covering for me after hours  [facility] Office  [phone]

## 2013-04-22 MED ORDER — LACOSAMIDE 100 MG PO TABS: 100.0000 mg | ORAL_TABLET | Freq: Two times a day (BID) | ORAL | Status: DC

## 2013-04-22 MED ORDER — HYDROCODONE-ACETAMINOPHEN 5-325 MG PO TABS: 1.0000 | ORAL_TABLET | Freq: Four times a day (QID) | ORAL | Status: DC | PRN

## 2013-04-22 MED ORDER — MAGIC MOUTHWASH W/LIDOCAINE: 5.0000 mL | Freq: Three times a day (TID) | ORAL | Status: DC | PRN

## 2013-04-22 NOTE — Discharge Summary (Signed)
Peter Barr, is a 28 y.o. male  DOB Sep 19, 1985  MRN [id number].  Admission date:  04/16/2013  Admitting Physician  Kathlen ModyVijaya Akula, MD  Discharge Date:  04/22/2013   Primary MD  No PCP Per Patient  Recommendations for primary care physician for things to follow:   Follow clinically   Admission Diagnosis  Neck pain [723.1] Back pain [724.5] Seizure disorder [345.90]   Discharge Diagnosis  Neck pain [723.1] Back pain [724.5] Seizure disorder [345.90]     Principal Problem:   Seizure Active Problems:   Sprain of ankle, unspecified site   Seizures   Dehydration      Past Medical History  Diagnosis Date  . Hypertension     Past Surgical History  Procedure Laterality Date  . Knee surgery       Discharge Condition: stable   Follow UP  Follow-up Information   Follow up with PCP. Schedule an appointment as soon as possible for a visit in 1 week.      Follow up with [facility]. Schedule an appointment as soon as possible for a visit in 1 week.   Contact information:   [address]     [address] [place] [id number] [phone]        Discharge Instructions  and  Discharge Medications     Discharge Orders   Future Orders Complete By Expires   Discharge instructions  As directed    Comments:     Do not drive and provide baby sitting services until you have seen by Primary MD or a Neurologist and advised to do so again.  Follow with Primary MD No PCP Per Patient in 7 days   Get CBC, CMP, checked 7 days by Primary MD and again as instructed by your Primary MD. Get a 2 view Chest X ray done next visit if you had Pneumonia of Lung problems at the Hospital.   Activity: As tolerated with Full fall precautions use walker/cane & assistance as needed   Disposition Home     Diet: Heart Healthy soft diet, advance as tolerated to regular consistency   For Heart failure patients - Check your Weight same time everyday, if you gain over 2 pounds, or you develop in leg swelling, experience more shortness of breath or chest pain, call your Primary MD immediately. Follow Cardiac Low Salt Diet and 1.8 lit/day fluid restriction.   On your next visit with her primary care physician please Get Medicines reviewed and adjusted.  Please request your Prim.MD to go over all Hospital Tests and Procedure/Radiological results at the follow up, please get all Hospital records sent to your Prim MD by signing hospital release before you go home.   If you experience worsening of your admission symptoms, develop shortness of breath, life threatening emergency, suicidal or homicidal thoughts you must seek medical attention immediately by calling 911 or calling your MD immediately  if symptoms less severe.  You Must read complete instructions/literature along with all the possible adverse reactions/side effects  for all the Medicines you take and that have been prescribed to you. Take any new Medicines after you have completely understood and accpet all the possible adverse reactions/side effects.    Do not drive when taking Pain medications.    Do not take more than prescribed Pain, Sleep and Anxiety Medications  Special Instructions: If you have smoked or chewed Tobacco  in the last 2 yrs please stop smoking, stop any regular Alcohol  and or any Recreational drug use.  Wear Seat belts while driving.   Please note  You were cared for by a hospitalist during your hospital stay. If you have any questions about your discharge medications or the care you received while you were in the hospital after you are discharged, you can call the unit and asked to speak with the hospitalist on call if the hospitalist that took care of you is not available. Once you are discharged, your primary  care physician will handle any further medical issues. Please note that NO REFILLS for any discharge medications will be authorized once you are discharged, as it is imperative that you return to your primary care physician (or establish a relationship with a primary care physician if you do not have one) for your aftercare needs so that they can reassess your need for medications and monitor your lab values.       Medication List    STOP taking these medications       traMADol 50 MG tablet  Commonly known as:  ULTRAM      TAKE these medications       HYDROcodone-acetaminophen 5-325 MG per tablet  Commonly known as:  NORCO/VICODIN  Take 1 tablet by mouth every 6 (six) hours as needed for moderate pain.     ibuprofen 800 MG tablet  Commonly known as:  ADVIL,MOTRIN  Take 800 mg by mouth every 8 (eight) hours as needed (pain).     Lacosamide 100 MG Tabs  Take 1 tablet (100 mg total) by mouth 2 (two) times daily.     magic mouthwash w/lidocaine Soln  Take 5 mLs by mouth 3 (three) times daily as needed for mouth pain.     OVER THE COUNTER MEDICATION  Take 2 tablets by mouth daily. Joint Gummies          Diet and Activity recommendation: See Discharge Instructions above   Consults obtained -  Neuro   Major procedures and Radiology Reports - PLEASE review detailed and final reports for all details, in brief -    EEG -  This is a normal EEG recording during wakefulness and during drowsiness. No evidence of an epileptic disorder is demonstrated.    Ct Head Wo Contrast  04/16/2013   CLINICAL DATA:  Fall status post seizure  EXAM: CT HEAD WITHOUT CONTRAST  CT CERVICAL SPINE WITHOUT CONTRAST  TECHNIQUE: Multidetector CT imaging of the head and cervical spine was performed following the standard protocol without intravenous contrast. Multiplanar CT image reconstructions of the cervical spine were also generated.  COMPARISON:  None.  FINDINGS: CT HEAD FINDINGS  No acute  intracranial abnormality. Specifically, no hemorrhage, hydrocephalus, mass lesion, acute infarction, or significant intracranial injury. No acute calvarial abnormality. Visualized paranasal sinuses and mastoid air cells are patent.  CT CERVICAL SPINE FINDINGS  There is straightening with minimal reversal of the normal cervical lordosis. This may represent collar placement, muscle spasm and/or positioning. Disk spaces are normal and there is no significant disk degeneration. No spondylosis is  identified and there is no spinal or foraminal stenosis. There is no prevertebral soft tissue thickening.  No fracture is identified in the cervical spine. No mass lesion is present.  IMPRESSION: 1. No evidence of focal acute intracranial abnormalities 2. Mild reversal of the normal cervical lordosis may reflect muscle spasm, collar placement, or positioning otherwise unremarkable CT of the cervical spine.   Electronically Signed   By: Salome Holmes M.D.   On: 04/16/2013 12:33   Ct Cervical Spine Wo Contrast  04/16/2013   CLINICAL DATA:  Fall status post seizure  EXAM: CT HEAD WITHOUT CONTRAST  CT CERVICAL SPINE WITHOUT CONTRAST  TECHNIQUE: Multidetector CT imaging of the head and cervical spine was performed following the standard protocol without intravenous contrast. Multiplanar CT image reconstructions of the cervical spine were also generated.  COMPARISON:  None.  FINDINGS: CT HEAD FINDINGS  No acute intracranial abnormality. Specifically, no hemorrhage, hydrocephalus, mass lesion, acute infarction, or significant intracranial injury. No acute calvarial abnormality. Visualized paranasal sinuses and mastoid air cells are patent.  CT CERVICAL SPINE FINDINGS  There is straightening with minimal reversal of the normal cervical lordosis. This may represent collar placement, muscle spasm and/or positioning. Disk spaces are normal and there is no significant disk degeneration. No spondylosis is identified and there is no spinal  or foraminal stenosis. There is no prevertebral soft tissue thickening.  No fracture is identified in the cervical spine. No mass lesion is present.  IMPRESSION: 1. No evidence of focal acute intracranial abnormalities 2. Mild reversal of the normal cervical lordosis may reflect muscle spasm, collar placement, or positioning otherwise unremarkable CT of the cervical spine.   Electronically Signed   By: Salome Holmes M.D.   On: 04/16/2013 12:33   Mr Laqueta Jean ZO Contrast  04/18/2013   CLINICAL DATA:  28 year old male with seizure witnessed by spouse. Hypertension. Initial encounter.  EXAM: MRI HEAD WITHOUT AND WITH CONTRAST  TECHNIQUE: Multiplanar, multiecho pulse sequences of the brain and surrounding structures were obtained without and with intravenous contrast.  CONTRAST:  20mL MULTIHANCE GADOBENATE DIMEGLUMINE 529 MG/ML IV SOLN  COMPARISON:  Head and cervical spine CT 04/16/2013.  FINDINGS: Cerebral volume is normal. No restricted diffusion to suggest acute infarction. No midline shift, mass effect, evidence of mass lesion, ventriculomegaly, extra-axial collection or acute intracranial hemorrhage. Cervicomedullary junction and pituitary are within normal limits. Negative visualized cervical spine. Major intracranial vascular flow voids are preserved.  Mesial temporal lobe structures are within normal limits. Wallace Cullens and white matter signal is within normal limits throughout the brain. No abnormal enhancement identified.  Visible internal auditory structures appear normal. Mastoids are clear. Visualized bone marrow signal is within normal limits. Visualized orbit soft tissues are within normal limits. Mild mucosal thickening left frontal sinus, otherwise negative paranasal sinuses. Visualized scalp soft tissues are within normal limits. Visible cervical lymph nodes and tonsillar hypertrophy, within normal limits for age.  IMPRESSION: Normal MRI appearance of the brain.   Electronically Signed   By: Augusto Gamble M.D.    On: 04/18/2013 16:08   Dg Chest Port 1 View  04/16/2013   CLINICAL DATA:  Seizure.  Chest pain.  EXAM: PORTABLE CHEST - 1 VIEW  COMPARISON:  None.  FINDINGS: Portable view of the chest demonstrates elevation of the right hemidiaphragm. Probable atelectasis at the right lung base. Otherwise, the lungs are clear. Heart and mediastinum are within normal limits. Trachea is midline.  IMPRESSION: Elevation of the right hemidiaphragm with volume loss. No focal  airspace disease.   Electronically Signed   By: Richarda Overlie M.D.   On: 04/16/2013 12:17    Micro Results     Recent Results (from the past 240 hour(s))  MRSA PCR SCREENING     Status: None   Collection Time    04/16/13  5:25 PM      Result Value Ref Range Status   MRSA by PCR NEGATIVE  NEGATIVE Final   Comment:            The GeneXpert MRSA Assay (FDA     approved for NASAL specimens     only), is one component of a     comprehensive MRSA colonization     surveillance program. It is not     intended to diagnose MRSA     infection nor to guide or     monitor treatment for     MRSA infections.     History of present illness and  Hospital Course:     Kindly see H&P for history of present illness and admission details, please review complete Labs, Consult reports and Test reports for all details in brief Peter Barr, is a 28 y.o. male, patient with history no past medical history was brought to the ER via EMS for seizures. History was obtained from the patient and his wife. 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 At 10:30 AM he was talking to his wife in the kitchen when he suddenly fell onto the child safety gate, hitting his head and neck. Patient's wife noticed that he had generalized tonic-clonic shaking, he became stiff and his eyes rolled up, the whole episode lasted about 3-5 minutes. After he stopped taking he was not responsive. Paramedics had arrived and during the  transportation he was noticed to be postictal. Patient's wife reported that he had another brief seizure episode in the ER .    He was admitted to medical service and neurology consulted. MRI done and negative . EEG did not show epileptiform activity. He was started on dilantin but developed side effects of dizziness on Dilantin, neurology has stopped Dilantin and placed him on Vimpat. Remains seizure-free and dizziness is much improved, he'll be sent home home PT if he qualifies, I will provide him with a walker in case his balance is poor. One week of work note will be provided. Of note patient appears to be somewhat anxious at baseline and usually has multiple vague complaints on a daily basis to me and to the nursing staff.     He had dehydration and rhabdomyolysis due to seizures which have resolved after hydration with IV fluids, he also sustained a tongue bite during his seizures which is stable. Chloraseptic mouth wash is provided upon discharge.    He has been given written instructions not to drive or provide babysitting services till he has been cleared by neurology to do so. He will be discharged on Vimpat.     Today   Subjective:   Peter Barr today has no headache,no chest abdominal pain,no new weakness tingling or numbness, feels much better wants to go home today.   Objective:   Blood pressure 131/85, pulse 97, temperature 98 F (36.7 C), temperature source Oral, resp. rate 16, height 5\' 11"  (1.803 m), weight 130.455 kg (287 lb 9.6 oz), SpO2 97.00%.   Intake/Output Summary (Last 24 hours) at 04/22/13 0900 Last data filed at 04/22/13 [phone]  Gross per 24 hour  Intake      0 ml  Output    400 ml  Net   -400 ml    Exam Awake Alert, Oriented *3, No new F.N deficits, Normal affect [place].AT,PERRAL Supple Neck,No JVD, No cervical lymphadenopathy appriciated.  Symmetrical Chest wall movement, Good air movement bilaterally, CTAB RRR,No Gallops,Rubs or new Murmurs, No  Parasternal Heave +ve B.Sounds, Abd Soft, Non tender, No organomegaly appriciated, No rebound -guarding or rigidity. No Cyanosis, Clubbing or edema, No new Rash or bruise  Data Review   CBC w Diff: Lab Results  Component Value Date   WBC 11.1* 04/18/2013   HGB 14.2 04/18/2013   HCT 41.4 04/18/2013   PLT 219 04/18/2013    CMP: Lab Results  Component Value Date   NA 140 04/17/2013   K 3.7 04/17/2013   CL 104 04/17/2013   CO2 21 04/17/2013   BUN 11 04/17/2013   CREATININE 1.06 04/17/2013   PROT 7.5 04/17/2013   ALBUMIN 3.4* 04/19/2013   BILITOT 0.5 04/17/2013   ALKPHOS 99 04/17/2013   AST 49* 04/17/2013   ALT 27 04/17/2013  .   Total Time in preparing paper work, data evaluation and todays exam - 35 minutes  Leroy Sea M.D on 04/22/2013 at 9:00 AM  [facility] Office  [phone]

## 2013-04-22 NOTE — Progress Notes (Signed)
Nsg Discharge Note  Admit Date:  04/16/2013 Discharge date: 04/22/2013   Peter Barr to be D/C'd Home per MD order.  AVS completed.  Copy for chart, and copy for patient signed, and dated. Patient/caregiver able to verbalize understanding.  Discharge Medication:   Medication List    STOP taking these medications       traMADol 50 MG tablet  Commonly known as:  ULTRAM      TAKE these medications       HYDROcodone-acetaminophen 5-325 MG per tablet  Commonly known as:  NORCO/VICODIN  Take 1 tablet by mouth every 6 (six) hours as needed for moderate pain.     ibuprofen 800 MG tablet  Commonly known as:  ADVIL,MOTRIN  Take 800 mg by mouth every 8 (eight) hours as needed (pain).     Lacosamide 100 MG Tabs  Take 1 tablet (100 mg total) by mouth 2 (two) times daily.     magic mouthwash w/lidocaine Soln  Take 5 mLs by mouth 3 (three) times daily as needed for mouth pain.     OVER THE COUNTER MEDICATION  Take 2 tablets by mouth daily. Joint Gummies        Discharge Assessment: Filed Vitals:   04/22/13 0600  BP: 131/85  Pulse: 97  Temp: 98 F (36.7 C)  Resp: 16   Skin clean, dry and intact without evidence of skin break down, no evidence of skin tears noted. IV catheter discontinued intact. Site without signs and symptoms of complications - no redness or edema noted at insertion site, patient denies c/o pain - only slight tenderness at site.  Dressing with slight pressure applied.  D/c Instructions-Education: Discharge instructions given to patient/family with verbalized understanding. D/c education completed with patient/family including follow up instructions, medication list, d/c activities limitations if indicated, with other d/c instructions as indicated by MD - patient able to verbalize understanding, all questions fully answered. Patient instructed to return to ED, call 911, or call MD for any changes in condition.  Patient escorted via WC, and D/C home via private  auto.  Peter Barr, Peter Goodlin L, RN 04/22/2013 11:59 AM

## 2013-04-22 NOTE — Progress Notes (Addendum)
Subjective: Patient reports that his dizziness is improving but is still worse when he stands.  Dilantin has been discontinued.  Patient is on Vimpat and seems to be tolerating well.    Objective: Current vital signs: BP 131/85  Pulse 97  Temp(Src) 98 F (36.7 C) (Oral)  Resp 16  Ht 5\' 11"  (1.803 m)  Wt 287 lb 9.6 oz (130.455 kg)  BMI 40.13 kg/m2  SpO2 97% Vital signs in last 24 hours: Temp:  [98 F (36.7 C)-98.6 F (37 C)] 98 F (36.7 C) (02/13 0600) Pulse Rate:  [97-100] 97 (02/13 0600) Resp:  [16] 16 (02/13 0600) BP: (113-131)/(76-85) 131/85 mmHg (02/13 0600) SpO2:  [95 %-97 %] 97 % (02/13 0600)  Intake/Output from previous day: 02/12 0701 - 02/13 0700 In: -  Out: 200 [Urine:200] Intake/Output this shift: Total I/O In: 360 [P.O.:360] Out: 200 [Urine:200] Nutritional status: Dysphagia  Neurologic Exam: Mental Status:  Alert, oriented, thought content appropriate. Speech fluent without evidence of aphasia. Able to follow 3 step commands without difficulty.  Cranial Nerves:  II: Visual fields grossly normal, pupils equal, round, reactive to light and accommodation  III,IV, VI: ptosis not present, extra-ocular motions intact bilaterally with nystagmus on bilateral lateral gaze-improving V,VII: smile symmetric, facial light touch sensation normal bilaterally  VIII: hearing normal bilaterally  IX,X: gag reflex present  XI: bilateral shoulder shrug  XII: midline tongue extension without atrophy or fasciculations  Motor:  5/5 throughout  Sensory: Pinprick and light touch intact throughout, bilaterally  Deep Tendon Reflexes:  2+ throuhgout  Plantars:  Right: downgoing  Left: downgoing  Cerebellar:  normal finger-to-nose, normal heel-to-shin test   Lab Results: Basic Metabolic Panel:  Recent Labs Lab 04/16/13 1132 04/17/13 0335  NA 138 140  K 3.9 3.7  CL 100 104  CO2 23 21  GLUCOSE 115* 130*  BUN 15 11  CREATININE 1.06 1.06  CALCIUM 9.4 8.6    Liver  Function Tests:  Recent Labs Lab 04/17/13 0335 04/19/13 1145  AST 49*  --   ALT 27  --   ALKPHOS 99  --   BILITOT 0.5  --   PROT 7.5  --   ALBUMIN 3.5 3.4*   No results found for this basename: LIPASE, AMYLASE,  in the last 168 hours No results found for this basename: AMMONIA,  in the last 168 hours  CBC:  Recent Labs Lab 04/16/13 1132 04/17/13 0335 04/18/13 0250  WBC 12.2* 14.1* 11.1*  HGB 15.4 14.7 14.2  HCT 44.8 43.3 41.4  MCV 80.7 80.8 81.5  PLT 230 215 219    Cardiac Enzymes:  Recent Labs Lab 04/18/13 0250 04/20/13 0452  CKTOTAL 2683* 880*    Lipid Panel: No results found for this basename: CHOL, TRIG, HDL, CHOLHDL, VLDL, LDLCALC,  in the last 168 hours  CBG:  Recent Labs Lab 04/16/13 1119  GLUCAP 116*    Microbiology: Results for orders placed during the hospital encounter of 04/16/13  MRSA PCR SCREENING     Status: None   Collection Time    04/16/13  5:25 PM      Result Value Ref Range Status   MRSA by PCR NEGATIVE  NEGATIVE Final   Comment:            The GeneXpert MRSA Assay (FDA     approved for NASAL specimens     only), is one component of a     comprehensive MRSA colonization     surveillance program. It is  not     intended to diagnose MRSA     infection nor to guide or     monitor treatment for     MRSA infections.    Coagulation Studies: No results found for this basename: LABPROT, INR,  in the last 72 hours  Imaging: No results found.  Medications:  I have reviewed the patient's current medications. Scheduled: . clonazePAM  0.5 mg Oral BID  . enoxaparin (LOVENOX) injection  40 mg Subcutaneous Q24H  . lacosamide  100 mg Oral BID  . naproxen  375 mg Oral TID WC    Assessment/Plan: Patient improving.  No longer on Dilantin and expect dizziness to resolve completely over the next few days.  Tolerating Vimpat.  No further seizure activity noted.    Recommendations: 1.  Patient unable to drive, operate heavy machinery,  perform activities at heights and participate in water activities until release by outpatient physician. 2.  Continue Vimpat at current dose 3.  Follow up with neurology as an outpatient   LOS: 6 days   Thana FarrLeslie Huck Ashworth, MD [facility] [phone] 04/22/2013  12:33 PM

## 2013-04-22 NOTE — Progress Notes (Signed)
Peter Barr was admitted to the Hospital on 04/16/2013 and Discharged  04/22/2013 and should be excused from work/school   for 7 days starting 04/16/2013 , may return to work/school without any restrictions.  Call Susa RaringPrashant [name] MD, [facility] [phone] with questions.  Leroy SeaSINGH,[name] K M.D on 04/22/2013,at 9:03 AM  [facility] Office  [phone]

## 2013-04-22 NOTE — Discharge Instructions (Signed)
Do not drive and provide baby sitting services until you have seen by Primary MD or a Neurologist and advised to do so again.  Follow with Primary MD No PCP Per Patient in 7 days   Get CBC, CMP, checked 7 days by Primary MD and again as instructed by your Primary MD. Get a 2 view Chest X ray done next visit if you had Pneumonia of Lung problems at the Hospital.   Activity: As tolerated with Full fall precautions use walker/cane & assistance as needed   Disposition Home    Diet: Heart Healthy soft diet, advance as tolerated to regular consistency   For Heart failure patients - Check your Weight same time everyday, if you gain over 2 pounds, or you develop in leg swelling, experience more shortness of breath or chest pain, call your Primary MD immediately. Follow Cardiac Low Salt Diet and 1.8 lit/day fluid restriction.   On your next visit with her primary care physician please Get Medicines reviewed and adjusted.  Please request your Prim.MD to go over all Hospital Tests and Procedure/Radiological results at the follow up, please get all Hospital records sent to your Prim MD by signing hospital release before you go home.   If you experience worsening of your admission symptoms, develop shortness of breath, life threatening emergency, suicidal or homicidal thoughts you must seek medical attention immediately by calling 911 or calling your MD immediately  if symptoms less severe.  You Must read complete instructions/literature along with all the possible adverse reactions/side effects for all the Medicines you take and that have been prescribed to you. Take any new Medicines after you have completely understood and accpet all the possible adverse reactions/side effects.    Do not drive when taking Pain medications.    Do not take more than prescribed Pain, Sleep and Anxiety Medications  Special Instructions: If you have smoked or chewed Tobacco  in the last 2 yrs please stop smoking,  stop any regular Alcohol  and or any Recreational drug use.  Wear Seat belts while driving.   Please note  You were cared for by a hospitalist during your hospital stay. If you have any questions about your discharge medications or the care you received while you were in the hospital after you are discharged, you can call the unit and asked to speak with the hospitalist on call if the hospitalist that took care of you is not available. Once you are discharged, your primary care physician will handle any further medical issues. Please note that NO REFILLS for any discharge medications will be authorized once you are discharged, as it is imperative that you return to your primary care physician (or establish a relationship with a primary care physician if you do not have one) for your aftercare needs so that they can reassess your need for medications and monitor your lab values.

## 2013-04-28 ENCOUNTER — Telehealth: Payer: Self-pay | Admitting: Neurology

## 2013-04-28 ENCOUNTER — Ambulatory Visit: Payer: 59

## 2013-04-28 NOTE — Telephone Encounter (Signed)
[facility] called about this patient to make sure we would be able to prescribe his medications.  The patient has an appointment as a new patient with Dr. Terrace ArabiaYan on 04-29-13.  I told him it was up to the doctor to decide on what medications she will prescribe, most likely his seizure medication, and not narcotics.

## 2013-04-29 ENCOUNTER — Ambulatory Visit (INDEPENDENT_AMBULATORY_CARE_PROVIDER_SITE_OTHER): Payer: 59 | Admitting: Neurology

## 2013-04-29 ENCOUNTER — Encounter: Payer: Self-pay | Admitting: Neurology

## 2013-04-29 VITALS — BP 135/98 | HR 87 | Ht 71.0 in | Wt 286.0 lb

## 2013-04-29 DIAGNOSIS — F419 Anxiety disorder, unspecified: Secondary | ICD-10-CM | POA: Insufficient documentation

## 2013-04-29 DIAGNOSIS — M549 Dorsalgia, unspecified: Secondary | ICD-10-CM | POA: Insufficient documentation

## 2013-04-29 DIAGNOSIS — R202 Paresthesia of skin: Secondary | ICD-10-CM | POA: Insufficient documentation

## 2013-04-29 DIAGNOSIS — I10 Essential (primary) hypertension: Secondary | ICD-10-CM

## 2013-04-29 DIAGNOSIS — R209 Unspecified disturbances of skin sensation: Secondary | ICD-10-CM

## 2013-04-29 DIAGNOSIS — R569 Unspecified convulsions: Secondary | ICD-10-CM

## 2013-04-29 DIAGNOSIS — G43909 Migraine, unspecified, not intractable, without status migrainosus: Secondary | ICD-10-CM | POA: Insufficient documentation

## 2013-04-29 DIAGNOSIS — F411 Generalized anxiety disorder: Secondary | ICD-10-CM

## 2013-04-29 MED ORDER — OXYCODONE-ACETAMINOPHEN 5-325 MG PO TABS: 1.0000 | ORAL_TABLET | Freq: Four times a day (QID) | ORAL | Status: DC | PRN

## 2013-04-29 MED ORDER — LACOSAMIDE 100 MG PO TABS
100.0000 mg | ORAL_TABLET | Freq: Two times a day (BID) | ORAL | Status: DC
Start: 2013-04-29 — End: 2013-09-15

## 2013-04-29 NOTE — Progress Notes (Signed)
PATIENT: Peter Barr DOB: Nov 11, 1985  HISTORICAL  Peter Barr is a 28 years old right-handed African American male, accompanied by his wife, followup for his hospital discharge in February twelfth 2015  He denied previous history of seizure, work as a jail guard, on night shift, does not require gun operation  In February sevens 2015, it was Saturday morning around 10 AM, he had night shift on Friday the night before 7 PM to 7 AM, got home around 9:50am, while talking with his wife, he had sudden loss of consciousness without warning signs, generalized tonic-clonic movement, lasting for 5 minutes, paramedic was called,  While in the emergency room around 2 PM, He had another seizure during sleep, general tonic-clonic seizure  Laboratory showed elevated CPK 2683,  Normal MRI of brain with and without contrast, EEG, CT cervical spine showed no fracture  He was loaded with Dilantin initially, develop IV site reaction, burning along IV line, upon discharge, he was switched to Vimpat 100 mg twice a day  But ever since his seizure, he complains of upper back pain, recently also noticed bilateral feet paresthesia, gait difficulty because of the pain, no bowel bladder incontinence,  He had x-ray of his chest at hospital, showed elevated right diaphragm,  He now complains was every deep breath, he had  low back pain, short-winded easily  His father had seizure at age 64, associated with high fever he has 3 children, age 88, 2, 2 months, no seizure,    REVIEW OF SYSTEMS: Full 14 system review of systems performed and notable only for fatigue, swelling in legs, spinning sensation, blurry vision, snoring, feeling hot, joint pain, joint swelling, achy muscles, confusion, headaches, weakness, dizziness, seizure, passing out, decreased energy  ALLERGIES: No Known Allergies  HOME MEDICATIONS: Current Outpatient Prescriptions on File Prior to Visit  Medication Sig Dispense Refill  .  Alum & Mag Hydroxide-Simeth (MAGIC MOUTHWASH W/LIDOCAINE) SOLN Take 5 mLs by mouth 3 (three) times daily as needed for mouth pain.  30 mL  1  . HYDROcodone-acetaminophen (NORCO/VICODIN) 5-325 MG per tablet Take 1 tablet by mouth every 6 (six) hours as needed for moderate pain.  15 tablet  0  . ibuprofen (ADVIL,MOTRIN) 800 MG tablet Take 800 mg by mouth every 8 (eight) hours as needed (pain).      Marland Kitchen lacosamide 100 MG TABS Take 1 tablet (100 mg total) by mouth 2 (two) times daily.  60 tablet  1  . OVER THE COUNTER MEDICATION Take 2 tablets by mouth daily. Joint Gummies         PAST MEDICAL HISTORY: Past Medical History  Diagnosis Date  . Hypertension   . Migraine   . Anxiety     PAST SURGICAL HISTORY: Past Surgical History  Procedure Laterality Date  . Knee surgery Right     FAMILY HISTORY: Family History  Problem Relation Age of Onset  . Hypertension Father   . Diabetes Maternal Grandmother   . Stroke Maternal Grandmother     SOCIAL HISTORY:  History   Social History  . Marital Status: Married    Spouse Name: N/A    Number of Children: 3  . Years of Education: N/A   Occupational History  . Biochemist, clinical.   Social History Main Topics  . Smoking status: Never Smoker   . Smokeless tobacco: Never Used  . Alcohol Use: 0.6 oz/week    1 Cans of beer per week     Comment: OCC  . Drug  Use: No  . Sexual Activity: Not on file   Other Topics Concern  . Not on file   Social History Narrative  . No narrative on file     PHYSICAL EXAM   Filed Vitals:   04/29/13 1210  BP: 135/98  Pulse: 87  Height: 5\' 11"  (1.803 m)  Weight: 286 lb (129.729 kg)     Body mass index is 39.91 kg/(m^2).   Generalized: In no acute distress  Neck: Supple, no carotid bruits   Cardiac: Regular rate rhythm  Pulmonary: Clear to auscultation bilaterally  Musculoskeletal: No deformity  Neurological examination  Mentation: Alert oriented to time, place, history taking, and  causual conversation  Cranial nerve II-XII: Pupils were equal round reactive to light. Extraocular movements were full.  Visual field were full on confrontational test. Bilateral fundi were sharp.  Facial sensation and strength were normal. Hearing was intact to finger rubbing bilaterally. Uvula tongue midline.  Head turning and shoulder shrug and were normal and symmetric.Tongue protrusion into cheek strength was normal.  Motor: Normal tone, bulk and strength, tenderness of thoracic spine upon deep palpitation, not lower back  Sensory: Intact to fine touch, pinprick, preserved vibratory sensation, and proprioception at toes.  Coordination: Normal finger to nose, heel-to-shin bilaterally there was no truncal ataxia  Gait: atalgic, cautious,   Romberg signs: Negative  Deep tendon reflexes: Brachioradialis 2/2, biceps 2/2, triceps 2/2, patellar 2/2, Achilles 2/2, plantar responses were flexor bilaterally.   DIAGNOSTIC DATA (LABS, IMAGING, TESTING) - I reviewed patient records, labs, notes, testing and imaging myself where available.  Lab Results  Component Value Date   WBC 11.1 04/18/2013   HGB 14.2 04/18/2013   HCT 41.4 04/18/2013   MCV 81.5 04/18/2013   PLT 219 04/18/2013      Component Value Date/Time   NA 140 04/17/2013 0335   K 3.7 04/17/2013 0335   CL 104 04/17/2013 0335   CO2 21 04/17/2013 0335   GLUCOSE 130 04/17/2013 0335   BUN 11 04/17/2013 0335   CREATININE 1.06 04/17/2013 0335   CALCIUM 8.6 04/17/2013 0335   PROT 7.5 04/17/2013 0335   ALBUMIN 3.4 04/19/2013 1145   AST 49 04/17/2013 0335   ALT 27 04/17/2013 0335   ALKPHOS 99 04/17/2013 0335   BILITOT 0.5 04/17/2013 0335   GFRNONAA >90 04/17/2013 0335   GFRAA >90 04/17/2013 0335    ASSESSMENT AND PLAN  Peter Maskerhillip Osborn is a 28 y.o. male with 2 generalized seizure in April 16 2013, normal workup, including MRI of brain, EEG 1. continue Vimpat 100 mg twice a day 2 I am concerned about his persistent upper back pain, difficulty taking deep breath,  bilateral feet paresthesia, emergency MRI of thoracic spine to rule out vertebral fracture, cord compression.    Peter Barr, M.D. Ph.D.  [facility] [address], [address] [place], [place] [address] [phone]

## 2013-05-02 ENCOUNTER — Telehealth: Payer: Self-pay | Admitting: *Deleted

## 2013-05-02 NOTE — Telephone Encounter (Signed)
PT was at [facility] on Friday and they should be sending the results and pictures to our office as soon as possible.  PT was also speaking regarding his short term disability. He works for [employer] in [place] [employer] [place] directly with the [place].  His job will not allow him to return to his normal position while taking these medications.  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  Please contact him for any additional information.  He needs to know if he can receive a letter or if he needs to go ahead and begin looking at Long Term Disability.  The best number to contact him is [phone].  Thank you

## 2013-05-02 NOTE — Telephone Encounter (Signed)
I have called patient, he did have MRI thoracic spine in Feb 20th 4pm   [facility] at [address]   He continues to complains of upper back pain,    Lupita LeashDonna:  Please check on his MRI thoracic spine result, thanks.

## 2013-05-02 NOTE — Telephone Encounter (Signed)
Patient requesting MRI results and also having different symptoms, would like a call. Please advise.

## 2013-05-02 NOTE — Telephone Encounter (Signed)
Peter Barr:   please call patient, MRI of thoracic spine was normal there was no evidence of compression fracture

## 2013-05-03 ENCOUNTER — Telehealth: Payer: Self-pay | Admitting: Neurology

## 2013-05-03 NOTE — Telephone Encounter (Signed)
Patient calling back and has questions on results of his MRI.  He would like a call back from the nurse. Please call.

## 2013-05-03 NOTE — Telephone Encounter (Signed)
Lupita LeashDonna:  Please call patient, it is okay to use heat for his low back pain, MRI of thoracic spine was normal

## 2013-05-03 NOTE — Telephone Encounter (Signed)
Called patient to inform him about his MRI of the spine being normal and there was no evidence of compression fracture. I advised the patient that if he has any other problems, questions or concerns to call the office. Patient verbalized understanding.

## 2013-05-03 NOTE — Telephone Encounter (Signed)
Called patient and he stated that he would like to know if it was ok to apply heat to his back because he is in pain. Patient stated that the Doctor did not want him to use heat before he had his MRI. Now that it is done can he use heat? Please advise.

## 2013-05-04 NOTE — Telephone Encounter (Signed)
Returning call-has questions

## 2013-05-04 NOTE — Telephone Encounter (Signed)
Peter Barr: Yes it is okay to give him a note to be off from work till March third

## 2013-05-04 NOTE — Telephone Encounter (Signed)
Dr. Terrace ArabiaYan are you willing to write him a note for being out of work until he sees his PCP on 05-10-13?

## 2013-05-04 NOTE — Telephone Encounter (Signed)
Noted  

## 2013-05-04 NOTE — Telephone Encounter (Signed)
Patient needs a note for work because of seizures and medicine patient is taking--still having low back pain--please call

## 2013-05-04 NOTE — Telephone Encounter (Signed)
Called patient to get more information concerning what he would like for his work note to say and patient stated that he has an appt set up to see PCP on 05/10/13 and would like a note stating that he will be out of work until he sees his PCP, please. Thanks

## 2013-05-04 NOTE — Telephone Encounter (Signed)
Left message that the patient is able to apply heat to his back, per Dr. Terrace ArabiaYan.  I also asked that he call back with specific instructions on what he needs this work note to say.

## 2013-05-06 ENCOUNTER — Telehealth: Payer: Self-pay | Admitting: Neurology

## 2013-05-06 ENCOUNTER — Encounter: Payer: Self-pay | Admitting: Neurology

## 2013-05-06 NOTE — Telephone Encounter (Signed)
I have called Peter Barr, he has questions about his disability, questions were answered,   He is taking Vimpat, no level is needed, He is no longer taking Dilantin  He had two generalized tonic clonic seizure twice, he needs to be on antiepileptic medications for 2 years.

## 2013-05-06 NOTE — Telephone Encounter (Signed)
Erie NoeVanessa a nurse with [facility] left message that the patient is unclear about his plan of care, and what is to be expected.  He would like to discuss this with the doctor and to go over the results of his studies an course of treatment.  Please call patient at [phone].

## 2013-05-06 NOTE — Telephone Encounter (Signed)
Note has been written.  I called patient and he will pick up note at front desk.

## 2013-05-10 ENCOUNTER — Telehealth: Payer: Self-pay | Admitting: Neurology

## 2013-05-10 NOTE — Telephone Encounter (Signed)
Patient came to office today with FMLA and Disability forms.  His PCP said it was up to the neurologist to fill out the forms.  I told him I would check with the doctor and see if she would fill out the forms.  He apparently didn't leave the forms or pay for them, told the front if she agreed to fill them out then he would return.  I have left message that the doctor has agreed to fill out forms.

## 2013-05-11 ENCOUNTER — Telehealth: Payer: Self-pay | Admitting: Neurology

## 2013-05-11 NOTE — Telephone Encounter (Signed)
Left message for patient to bring in forms and we will fill them out based on his exam, and it is up to his company to decide his disability or not.    Spoke to patient and tried to explain that we are not preventing him from going back to work, but it's his diagnosis.  The employer makes that decision based on his condition.  He seemed to understand and will bring in forms to be filled out.

## 2013-05-11 NOTE — Telephone Encounter (Signed)
Pt called wants Lupita LeashDonna to call him back, states he didn't understand the msg, I advised him that the Dr. Rosealee AlbeeAgreed to fill out the forms but that he needed to bring them in and pay for them. Pt stated after that he still wanted Lupita LeashDonna to call him back.

## 2013-05-12 DIAGNOSIS — Z0289 Encounter for other administrative examinations: Secondary | ICD-10-CM

## 2013-05-19 ENCOUNTER — Telehealth: Payer: Self-pay | Admitting: *Deleted

## 2013-05-19 ENCOUNTER — Encounter: Payer: Self-pay | Admitting: Neurology

## 2013-05-19 NOTE — Telephone Encounter (Signed)
Mr. Peter Barr call about his disability forms I explain to Mr. Peter Barr it takes 10 /14 days. We will call him once the forms are ready.

## 2013-05-21 ENCOUNTER — Emergency Department (INDEPENDENT_AMBULATORY_CARE_PROVIDER_SITE_OTHER)
Admission: EM | Admit: 2013-05-21 | Discharge: 2013-05-21 | Disposition: A | Discharge status: Home / Self Care | Payer: 59 | Source: Home / Self Care | Attending: Emergency Medicine | Admitting: Emergency Medicine

## 2013-05-21 ENCOUNTER — Encounter (HOSPITAL_COMMUNITY): Payer: Self-pay | Admitting: Emergency Medicine

## 2013-05-21 DIAGNOSIS — M549 Dorsalgia, unspecified: Secondary | ICD-10-CM

## 2013-05-21 DIAGNOSIS — R51 Headache: Secondary | ICD-10-CM

## 2013-05-21 DIAGNOSIS — R519 Headache, unspecified: Secondary | ICD-10-CM

## 2013-05-21 DIAGNOSIS — G40909 Epilepsy, unspecified, not intractable, without status epilepticus: Secondary | ICD-10-CM

## 2013-05-21 DIAGNOSIS — R42 Dizziness and giddiness: Secondary | ICD-10-CM

## 2013-05-21 NOTE — Discharge Instructions (Signed)
Vertigo Vertigo means you feel like you or your surroundings are moving when they are not. Vertigo can be dangerous if it occurs when you are at work, driving, or performing difficult activities.  CAUSES  Vertigo occurs when there is a conflict of signals sent to your brain from the visual and sensory systems in your body. There are many different causes of vertigo, including:  Infections, especially in the inner ear.  A bad reaction to a drug or misuse of alcohol and medicines.  Withdrawal from drugs or alcohol.  Rapidly changing positions, such as lying down or rolling over in bed.  A migraine headache.  Decreased blood flow to the brain.  Increased pressure in the brain from a head injury, infection, tumor, or bleeding. SYMPTOMS  You may feel as though the world is spinning around or you are falling to the ground. Because your balance is upset, vertigo can cause nausea and vomiting. You may have involuntary eye movements (nystagmus). DIAGNOSIS  Vertigo is usually diagnosed by physical exam. If the cause of your vertigo is unknown, your caregiver may perform imaging tests, such as an MRI scan (magnetic resonance imaging). TREATMENT  Most cases of vertigo resolve on their own, without treatment. Depending on the cause, your caregiver may prescribe certain medicines. If your vertigo is related to body position issues, your caregiver may recommend movements or procedures to correct the problem. In rare cases, if your vertigo is caused by certain inner ear problems, you may need surgery. HOME CARE INSTRUCTIONS   Follow your caregiver's instructions.  Avoid driving.  Avoid operating heavy machinery.  Avoid performing any tasks that would be dangerous to you or others during a vertigo episode.  Tell your caregiver if you notice that certain medicines seem to be causing your vertigo. Some of the medicines used to treat vertigo episodes can actually make them worse in some people. SEEK  IMMEDIATE MEDICAL CARE IF:   Your medicines do not relieve your vertigo or are making it worse.  You develop problems with talking, walking, weakness, or using your arms, hands, or legs.  You develop severe headaches.  Your nausea or vomiting continues or gets worse.  You develop visual changes.  A family member notices behavioral changes.  Your condition gets worse. MAKE SURE YOU:  Understand these instructions.  Will watch your condition.  Will get help right away if you are not doing well or get worse. Document Released: 12/04/2004 Document Revised: 05/19/2011 Document Reviewed: 09/12/2010 Exit[employer] Patient Information 2014 [employer].  

## 2013-05-21 NOTE — ED Notes (Addendum)
Patient complains of dizziness that started after he left the hospital; states he can't stand up without falling. Sates he was put on seizure Rx in February and states dizziness after starting the medication.

## 2013-05-21 NOTE — ED Provider Notes (Addendum)
Chief Complaint   Chief Complaint  Patient presents with  . Dizziness    History of Present Illness   Peter Barr is an unfortunate 28 year old male who works [employer], dealing with prisoners. He had a generalized, tonic/clonic grand mal convulsion on February 7 of this year. This was his first seizure ever. [facility]. He had one additional seizure while in the hospital. Workup was negative including negative CT and MRI scans. He was seen by the neuro hospitalist and treated initially with Dilantin. He had a reaction to this, so was stopped and he was sent home on Lacosamide 100 mg twice daily. He is discharged date from the hospital was February 13. He has been followed by Dr. Terrace ArabiaYan once in her office. He is also taking Vicodin and OxyContin. Ever since his seizure he has not done well. He's felt nervous, anxious, and stressed out. He's had almost continuous vertigo since then. This is worse with movement. He's felt hot and cold, his vision has been blurry vision double vision and severe headaches. He has pain in his neck and upper back. His right arm sometimes tingles. He went through some physical therapy, but does not feel any better. He had an MRI of his thoracic spine which was negative. Right now the biggest issue is returning to work. He feels so bad he does not think he can work at this time, especially during the work he does, interacting with prisoners. Dr. Terrace ArabiaYan is filling out forms for disability. Since his hospital stay he's had no further seizures. He is very unsteady on his feet. He's also concerned about his ability to drive. The patient states he felt more dizzy than usual today. At one point he states he could hear his wife talking to him but felt like he couldn't respond to her. He felt like she was a long way off, in a tunnel.  Review of Systems   Other than noted above, the patient denies any of the following  symptoms: Systemic:  No fever, chills, fatigue, or weight loss. Eye:  No blurred vision, visual change or diplopia. ENT:  No ear pain, tinnitus, hearing loss, nasal congestion, or rhinorrhea. Cardiac:  No chest pain, dyspnea, palpitations or syncope. Neuro:  No headache, paresthesias, weakness, trouble with speech, coordination or ambulation.  PMFSH   Past medical history, family history, social history, meds, and allergies were reviewed.    Physical Examination     Vital signs:  BP 148/88  Pulse 80  Temp(Src) 98.1 F (36.7 C)  Resp 18  SpO2 100% Filed Vitals:   05/21/13 1540 05/21/13 1542  BP: 144/86 148/88  Pulse: 80 80  Temp:  98.1 F (36.7 C)  Resp: 18   SpO2: 100%    General:  Alert, oriented times 3, in no distress. Eye:  PERRL, full EOM, no nystagmus. ENT:  TMs and canals normal.  Nasal mucosa normal.  Pharynx clear. Neck:  No adenopathy, tenderness, or mass.  Thyroid normal.  No carotid bruit. Lungs:  Breath sounds clear and equal bilaterally.  No wheezes, rales or rhonchi. Heart:  Regular rhythm.  No gallops, murmers, or rubs. Neuro:  Alert and oriented times 3.  Cranial nerves intact.  No pronator drift.  Finger to nose normal.  No focal weakness.  Sensation intact to light touch.  He is very unsteady on his feet, but is able to walk. Did not attend Romberg's sign her tandem gait due  to unsteadiness.   Assessment   The primary encounter diagnosis was Vertigo. Diagnoses of Seizure disorder, Headache, and Upper back pain were also pertinent to this visit.  He has a long way to go before he will be able to return to work. It's possible that some of his vertigo and unsteadiness might be due to side effect of the medication. This may go away with time, but may not. I suggested that he followup with his neurologist to discuss side effect of the medication. I don't think he can drive at this time.  Plan     1.  Meds:  The following meds were prescribed:   Discharge  Medication List as of 05/21/2013  4:18 PM      2.  Patient Education/Counseling:  The patient was given appropriate handouts, self care instructions, and instructed in symptomatic relief.  I instructed him not to drive a motor vehicle. I also instructed him to followup with Dr. Terrace Arabia early next week regarding possible medication side effects, disability, plans for driving or babysitting.  3.  Follow up:  The patient was told to follow up here if no better in 3 to 4 days, or sooner if becoming worse in any way, and given some red flag symptoms such as new neurological symptoms, chest pain, or syncope which would prompt immediate return.       Reuben Likes, MD 05/21/13 2022  Reuben Likes, MD 05/21/13 [phone]

## 2013-05-23 ENCOUNTER — Telehealth: Payer: Self-pay | Admitting: Neurology

## 2013-05-23 NOTE — Telephone Encounter (Signed)
Pt went to the urgent care and was advised that the Dr. would f/u with Dr. 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 I did see notes per Dr. Leslee Homeavid Keller pt did arrive on 05/21/13 with some problems. Please see notes per Dr. Lorenz CoasterKeller and also please contact pt concerning this matter.

## 2013-05-24 ENCOUNTER — Telehealth: Payer: Self-pay | Admitting: *Deleted

## 2013-05-24 NOTE — Telephone Encounter (Signed)
Patient calling about his forms, patient appt on 05/25/2013 want to pickup his forms on 05/25/2013.

## 2013-05-24 NOTE — Telephone Encounter (Signed)
Called patient to schedule an appt on 05/25/13 with Dr. Terrace ArabiaYan, per Dr. Lorenz CoasterKeller from ED visit. I advised the patient that if he has any other problems, questions or concerns to call the office. Patient verbalized understanding.

## 2013-05-25 ENCOUNTER — Encounter: Payer: Self-pay | Admitting: Neurology

## 2013-05-25 ENCOUNTER — Ambulatory Visit (INDEPENDENT_AMBULATORY_CARE_PROVIDER_SITE_OTHER): Payer: Managed Care, Other (non HMO) | Admitting: Radiology

## 2013-05-25 ENCOUNTER — Ambulatory Visit (INDEPENDENT_AMBULATORY_CARE_PROVIDER_SITE_OTHER): Payer: Managed Care, Other (non HMO) | Admitting: Neurology

## 2013-05-25 ENCOUNTER — Telehealth: Payer: Self-pay | Admitting: *Deleted

## 2013-05-25 VITALS — BP 136/91 | HR 81 | Ht 71.0 in | Wt 286.0 lb

## 2013-05-25 DIAGNOSIS — I10 Essential (primary) hypertension: Secondary | ICD-10-CM

## 2013-05-25 DIAGNOSIS — R42 Dizziness and giddiness: Secondary | ICD-10-CM

## 2013-05-25 DIAGNOSIS — M549 Dorsalgia, unspecified: Secondary | ICD-10-CM

## 2013-05-25 DIAGNOSIS — E860 Dehydration: Secondary | ICD-10-CM

## 2013-05-25 DIAGNOSIS — R569 Unspecified convulsions: Secondary | ICD-10-CM

## 2013-05-25 DIAGNOSIS — G43909 Migraine, unspecified, not intractable, without status migrainosus: Secondary | ICD-10-CM

## 2013-05-25 DIAGNOSIS — R269 Unspecified abnormalities of gait and mobility: Secondary | ICD-10-CM | POA: Insufficient documentation

## 2013-05-25 DIAGNOSIS — F419 Anxiety disorder, unspecified: Secondary | ICD-10-CM

## 2013-05-25 MED ORDER — SERTRALINE HCL 50 MG PO TABS: 50.0000 mg | ORAL_TABLET | Freq: Every day | ORAL | Status: DC

## 2013-05-25 NOTE — Progress Notes (Signed)
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  In February 7th 2015, it was Saturday morning around 10 AM, he had night shift on Friday the night before 7 PM to 7 AM, got home around 9:50am, while talking with his wife, he had sudden loss of consciousness without warning signs, generalized tonic-clonic movement, lasting for 5 minutes, paramedic was called,  While in the emergency room around 2 PM, He had another seizure during sleep, general tonic-clonic seizure  Laboratory showed elevated CPK 2683,  Normal MRI of brain with and without contrast, EEG, CT cervical spine showed no fracture  He was loaded with Dilantin initially, develop IV site reaction, burning along IV line, upon discharge, he was switched to Vimpat 100 mg twice a day  But ever since his seizure, he complains of upper back pain, recently also noticed bilateral feet paresthesia, gait difficulty because of the pain, no bowel bladder incontinence,  He had x-ray of his chest at hospital, showed elevated right diaphragm,  He now complains was every deep breath, he had  low back pain, short-winded easily  His father had seizure at age 45, associated with high fever he has 3 children, age 45, 2, 2 months, no seizure,   UPDATE March 18th 2015:  MRI thoracic spine was normal,  In March 14th,2015, he had sudden onset dizziness, he got off the bed, he noticed room was spinning, felt to the floor when he tried to walk, nauseous, could not tolerate it, was taken to ED, was diagnosed with vertigo.  He does not feel comfortable about his body, his low back pain has much improved.  He also developed visual hallucination, thought that his son running across room  when his wife stated that it was not the case. He also complains of fatigue, lack of appetite, gait difficulty, sometimes using cane, loose balance easily.  He does not feel comfortable going back to work as a jail guard to put himself and others in danger,  Today, due to Apley's maneuver, he developed severe dizziness, double vision, I was not able to appreciate nystagmus, he also has body jerking movement, he had a repeat EEG, that was essentially normal  REVIEW OF SYSTEMS: Full 14 system review of systems performed and notable only for chill, fatigue, excessive sweating, neck stiffness, ringing in ears, eye itching, light sensitivity, double vision, shortness of breath, choking, chest tightness, chest pain, snoring, joint pain, joint swelling, back pain, achy muscles, muscle cramps, dizziness, headaches, weakness, confusion, nervousness, hallucinations   ALLERGIES: No Known Allergies  HOME MEDICATIONS: Current Outpatient Prescriptions on File Prior to Visit  Medication Sig Dispense Refill  . Alum & Mag Hydroxide-Simeth (MAGIC MOUTHWASH W/LIDOCAINE) SOLN Take 5 mLs by mouth 3 (three) times daily as needed for mouth pain.  30 mL  1  . HYDROcodone-acetaminophen (NORCO/VICODIN) 5-325 MG per tablet Take 1 tablet by mouth every 6 (six) hours as needed for moderate pain.  15 tablet  0  . ibuprofen (ADVIL,MOTRIN) 800 MG tablet Take 800 mg by mouth every 8 (eight) hours as needed (pain).      Marland Kitchen lacosamide 100 MG TABS Take 1 tablet (100 mg total) by mouth 2 (two) times daily.  60 tablet  1  .  OVER THE COUNTER MEDICATION Take 2 tablets by mouth daily. Joint Gummies         PAST MEDICAL HISTORY: Past Medical History  Diagnosis Date  . Hypertension   . Migraine   . Anxiety     PAST SURGICAL HISTORY: Past Surgical History  Procedure Laterality Date  . Knee surgery Right     FAMILY HISTORY: Family History  Problem Relation Age of Onset  . Hypertension Father   . Diabetes Maternal  Grandmother   . Stroke Maternal Grandmother     SOCIAL HISTORY:  History   Social History  . Marital Status: Married    Spouse Name: N/A    Number of Children: 3  . Years of Education: N/A   Occupational History  . Biochemist, clinicalDetention officer.   Social History Main Topics  . Smoking status: Never Smoker   . Smokeless tobacco: Never Used  . Alcohol Use: 0.6 oz/week    1 Cans of beer per week     Comment: OCC  . Drug Use: No  . Sexual Activity: Not on file   Other Topics Concern  . Not on file   Social History Narrative  . No narrative on file     PHYSICAL EXAM   Filed Vitals:   05/25/13 0914  BP: 136/91  Pulse: 81  Height: 5\' 11"  (1.803 m)  Weight: 286 lb (129.729 kg)     Body mass index is 39.91 kg/(m^2).   Generalized: In no acute distress  Neck: Supple, no carotid bruits   Cardiac: Regular rate rhythm  Pulmonary: Clear to auscultation bilaterally  Musculoskeletal: No deformity  Neurological examination  Mentation: Alert oriented to time, place, history taking, and causual conversation, tired looking  Cranial nerve II-XII: Pupils were equal round reactive to light. Extraocular movements were full.  Visual field were full on confrontational test. Bilateral fundi were sharp.  Facial sensation and strength were normal. Hearing was intact to finger rubbing bilaterally. Uvula tongue midline.  Head turning and shoulder shrug and were normal and symmetric.Tongue protrusion into cheek strength was normal.  Motor: Normal tone, bulk and strength, tenderness of thoracic spine upon deep palpitation, not lower back  Sensory: Intact to fine touch, pinprick, preserved vibratory sensation, and proprioception at toes.  Coordination: Normal finger to nose, heel-to-shin bilaterally there was no truncal ataxia  Gait: atalgic, cautious,   Romberg signs: Negative  Deep tendon reflexes: Brachioradialis 2/2, biceps 2/2, triceps 2/2, patellar 2/2, Achilles 2/2, plantar  responses were flexor bilaterally.  During Apley's maneuver, right ear dependent position, after short latency, he complains of vertigo, double vision, which has been persistent, with left ear dependent position as well, he also developed body jerking movement, no loss of consciousness, but described himself as confused.   DIAGNOSTIC DATA (LABS, IMAGING, TESTING) - I reviewed patient records, labs, notes, testing and imaging myself where available.  Lab Results  Component Value Date   WBC 11.1 04/18/2013   HGB 14.2 04/18/2013   HCT 41.4 04/18/2013   MCV 81.5 04/18/2013   PLT 219 04/18/2013      Component Value Date/Time   NA 140 04/17/2013 0335   K 3.7 04/17/2013 0335   CL 104 04/17/2013 0335   CO2 21 04/17/2013 0335   GLUCOSE 130 04/17/2013 0335   BUN 11 04/17/2013 0335   CREATININE 1.06 04/17/2013 0335   CALCIUM 8.6 04/17/2013 0335   PROT 7.5 04/17/2013 0335   ALBUMIN 3.4 04/19/2013 1145   AST 49 04/17/2013 0335  ALT 27 04/17/2013 0335   ALKPHOS 99 04/17/2013 0335   BILITOT 0.5 04/17/2013 0335   GFRNONAA >90 04/17/2013 0335   GFRAA >90 04/17/2013 0335    ASSESSMENT AND PLAN  Benjiman Sedgwick is a 28 y.o. male with 2 generalized seizure in April 16 2013, normal workup, including MRI of brain, EEG, now-developed acute onset of vertigo, today he also has body jerking movement, with normal neurological examination, EEG,  Continue current antipeptic medications Vimpat 100 mg twice a day, I do not think he is suitable to go back to work as a jail guard, related people works well filled for him,    60 minutes spent in coordinating his care today,More than 50% of time was face-to-face consultation  Levert Feinstein, M.D. Ph.D.  [facility] [address], [address] [place], [place] [address] [phone]

## 2013-05-25 NOTE — Telephone Encounter (Signed)
Patient call [facility]; Madison HickmanCarol Gonzalez [phone]  Medical Records fax on 05/25/2013.

## 2013-05-27 NOTE — Procedures (Signed)
   HISTORY: 28 years old [place], [place], distal generalized seizure in April 16 2013, normal workup including MRI of the brain, EEG,. Patient complains of confusion, had body jerking movement during today's examination  TECHNIQUE:  16 channel EEG was performed based on standard 10-16 international system. One channel was dedicated to EKG, which has demonstrates normal sinus rhythm of 72 beats per minutes.  Upon awakening, the posterior background activity was well-developed, in alpha range, 10 Hz, with amplitude of 40 microvoltage, reactive to eye opening and closure.  There was no evidence of epilepsy form discharge. There was some described body jerking movement, with associated muscle artifact, but there is no background EEG change.  Photic stimulation was performed, which induced a symmetric photic driving.  Hyperventilation was performed, there was no abnormality elicit.  Patient was drowsy during recording, but there was no deeper sleep achieved.  CONCLUSION: This is a  normal awake EEG.  There is no electrodiagnostic evidence of epileptiform discharge

## 2013-06-16 ENCOUNTER — Telehealth: Payer: Self-pay | Admitting: Neurology

## 2013-06-16 NOTE — Telephone Encounter (Signed)
Message was forwarded to me, however, it appears the patient is requesting that something be sent for his disability.  I called the patient back, got no answer.  Left message.  If a refill is needed, I will be happy to take care of that, however, if the patient needs something for disability, that will need to be sent to the triage pool again for them to address with the provider.  Thank you.

## 2013-06-16 NOTE — Telephone Encounter (Signed)
Pt has questions concerning continuing his medications. Please advise

## 2013-06-16 NOTE — Telephone Encounter (Signed)
Pt wants to know if something can be sent over to his disability to be able to continue with his medication please call pt concerning this matter. Thanks

## 2013-06-17 ENCOUNTER — Telehealth: Payer: Self-pay | Admitting: Neurology

## 2013-06-17 ENCOUNTER — Ambulatory Visit: Payer: Managed Care, Other (non HMO) | Attending: Neurology | Admitting: Physical Therapy

## 2013-06-17 DIAGNOSIS — M549 Dorsalgia, unspecified: Secondary | ICD-10-CM | POA: Diagnosis not present

## 2013-06-17 DIAGNOSIS — R269 Unspecified abnormalities of gait and mobility: Secondary | ICD-10-CM | POA: Diagnosis not present

## 2013-06-17 DIAGNOSIS — IMO0001 Reserved for inherently not codable concepts without codable children: Secondary | ICD-10-CM | POA: Insufficient documentation

## 2013-06-17 NOTE — Telephone Encounter (Signed)
Spoke to patient and relayed that his disability co will have to let us know what they are requesting.  He is aware that the doctor will not be back until 06-22-13.  He will call the disability company on Monday to get more information.

## 2013-06-17 NOTE — Telephone Encounter (Signed)
Pt called. He stated that they are going to suspend his disability on 06-22-13.  He is planning on calling Disability this morning to find out what if anything they may still need, because he was sure that everything was submitted. He will call back with this information.  He stated that he needs to stay on disability at least until his appointment with Dr. Terrace ArabiaYan in May so that he is able to get his medicines. Dr. Terrace ArabiaYan has not released him to go back to work and he won't be able to drive until July 7th. He asked if once he gets his answers from Disability if someone could look into helping him get this back before 4-15 he would be grateful.  Please call to discuss as necessary.  Thank you.

## 2013-06-20 ENCOUNTER — Telehealth: Payer: Self-pay | Admitting: Neurology

## 2013-06-21 ENCOUNTER — Ambulatory Visit: Payer: Managed Care, Other (non HMO) | Admitting: Physical Therapy

## 2013-06-21 DIAGNOSIS — IMO0001 Reserved for inherently not codable concepts without codable children: Secondary | ICD-10-CM | POA: Diagnosis not present

## 2013-06-21 NOTE — Telephone Encounter (Signed)
Spoke to patient and relayed nothing could be done until the doctor returned.  I told him I would call [employer] to explain the situation.    [facility] and they have extended the patients benefits until his next office visit which is 07-26-13.   Left message for patient relaying the extended benefits

## 2013-06-22 NOTE — Telephone Encounter (Signed)
[employer] sent another form to be filled out, requesting medical records from 06-08-13 to present.  The patient was last seen on 05-25-13.  I called [employer] on 06-21-13 and they reviewed his case and have extended his disability benefits until his appointment on 5-19.15.

## 2013-06-23 ENCOUNTER — Encounter: Payer: 59 | Admitting: Physical Therapy

## 2013-06-27 ENCOUNTER — Ambulatory Visit: Payer: Managed Care, Other (non HMO) | Admitting: Physical Therapy

## 2013-06-27 DIAGNOSIS — IMO0001 Reserved for inherently not codable concepts without codable children: Secondary | ICD-10-CM | POA: Diagnosis not present

## 2013-06-29 ENCOUNTER — Ambulatory Visit: Payer: Managed Care, Other (non HMO) | Admitting: Rehabilitative and Restorative Service Providers"

## 2013-06-29 DIAGNOSIS — IMO0001 Reserved for inherently not codable concepts without codable children: Secondary | ICD-10-CM | POA: Diagnosis not present

## 2013-07-04 ENCOUNTER — Encounter: Payer: 59 | Admitting: Physical Therapy

## 2013-07-04 ENCOUNTER — Ambulatory Visit: Payer: Managed Care, Other (non HMO) | Admitting: Physical Therapy

## 2013-07-04 DIAGNOSIS — IMO0001 Reserved for inherently not codable concepts without codable children: Secondary | ICD-10-CM | POA: Diagnosis not present

## 2013-07-06 ENCOUNTER — Ambulatory Visit: Payer: 59 | Admitting: Physical Therapy

## 2013-07-12 ENCOUNTER — Ambulatory Visit: Payer: 59 | Admitting: Physical Therapy

## 2013-07-14 ENCOUNTER — Ambulatory Visit: Payer: 59 | Admitting: Physical Therapy

## 2013-07-18 ENCOUNTER — Ambulatory Visit: Payer: Managed Care, Other (non HMO) | Attending: Neurology | Admitting: Physical Therapy

## 2013-07-18 DIAGNOSIS — M549 Dorsalgia, unspecified: Secondary | ICD-10-CM | POA: Insufficient documentation

## 2013-07-18 DIAGNOSIS — IMO0001 Reserved for inherently not codable concepts without codable children: Secondary | ICD-10-CM | POA: Insufficient documentation

## 2013-07-18 DIAGNOSIS — R269 Unspecified abnormalities of gait and mobility: Secondary | ICD-10-CM | POA: Insufficient documentation

## 2013-07-19 ENCOUNTER — Encounter: Payer: 59 | Admitting: Physical Therapy

## 2013-07-20 ENCOUNTER — Telehealth: Payer: Self-pay | Admitting: Neurology

## 2013-07-20 NOTE — Telephone Encounter (Signed)
Called pt and pt stated that he needed someone to call his insurance co to inform them that he was still out of work. I informed the pt the if his insurance co could contact us, that we would be glad to confirmation that pt was out of work until his next OV on 07/26/13 per phone note with Lupita Leashonna, RN on 06/21/13. Pt verbalized understanding.

## 2013-07-20 NOTE — Telephone Encounter (Signed)
Patients needs to discuss disability.

## 2013-07-26 ENCOUNTER — Encounter: Payer: Self-pay | Admitting: Neurology

## 2013-07-26 ENCOUNTER — Ambulatory Visit: Payer: 59 | Admitting: Physical Therapy

## 2013-07-26 ENCOUNTER — Ambulatory Visit (INDEPENDENT_AMBULATORY_CARE_PROVIDER_SITE_OTHER): Payer: Managed Care, Other (non HMO) | Admitting: Neurology

## 2013-07-26 VITALS — BP 137/86 | HR 78 | Ht 71.0 in | Wt 283.0 lb

## 2013-07-26 DIAGNOSIS — R569 Unspecified convulsions: Secondary | ICD-10-CM

## 2013-07-26 DIAGNOSIS — R269 Unspecified abnormalities of gait and mobility: Secondary | ICD-10-CM

## 2013-07-26 DIAGNOSIS — E860 Dehydration: Secondary | ICD-10-CM

## 2013-07-26 DIAGNOSIS — I10 Essential (primary) hypertension: Secondary | ICD-10-CM

## 2013-07-26 DIAGNOSIS — G43909 Migraine, unspecified, not intractable, without status migrainosus: Secondary | ICD-10-CM

## 2013-07-26 DIAGNOSIS — F419 Anxiety disorder, unspecified: Secondary | ICD-10-CM

## 2013-07-26 DIAGNOSIS — M549 Dorsalgia, unspecified: Secondary | ICD-10-CM

## 2013-07-26 MED ORDER — HYDROCODONE-ACETAMINOPHEN 5-325 MG PO TABS: 1.0000 | ORAL_TABLET | Freq: Four times a day (QID) | ORAL | Status: DC | PRN

## 2013-07-26 NOTE — Progress Notes (Signed)
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  In February 7th 2015, it was Saturday morning around 10 AM, he had night shift on Friday the night before 7 PM to 7 AM, got home around 9:50am, while talking with his wife, he had sudden loss of consciousness without warning signs, generalized tonic-clonic movement, lasting for 5 minutes, paramedic was called,  While in the emergency room around 2 PM, He had another seizure during sleep, general tonic-clonic seizure, he left the hospital with Vimpat 100 mg twice a day,  Laboratory showed elevated CPK 2683,  Normal MRI of brain with and without contrast, EEG, CT cervical spine showed no fracture  He was loaded with Dilantin initially, develop IV site reaction, burning along IV line, upon discharge, he was switched to Vimpat 100 mg twice a day  But ever since his seizure, he complains of upper back pain, recently also noticed bilateral feet paresthesia, gait difficulty because of the pain, no bowel bladder incontinence,  He had x-ray of his chest at hospital, showed elevated right diaphragm,  He now complains was every deep breath, he had  low back pain, short-winded easily  His father had seizure at age 586, associated with high fever he has 3 children, age 544, 2, 2 months, no seizure,   UPDATE March 18th 2015:  MRI thoracic spine was normal,  In March 14th,2015, he had sudden onset dizziness, he got off the bed, he noticed room was spinning, felt to the floor when he tried to walk, nauseous, could not tolerate it, was taken to ED, was diagnosed with vertigo.  He does not feel comfortable about his body, his low back pain has much improved.  He  also developed visual hallucination, thought that his son running across room when his wife stated that it was not the case. He also complains of fatigue, lack of appetite, gait difficulty, sometimes using cane, loose balance easily.  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 He is still taking Vimpat 100 mg twice a day, he continued to complains of low back pain, also complained of fatigue, joints pain, daytime sleepiness, frequent headaches, generalized weakness, muscle achy pain, depression, anxiety, [facility].  We have complete his paper work for short term disability, further paperwork needs to be complete by his current psychotherapist  REVIEW OF SYSTEMS: Full 14 system review of systems performed and notable only for  fatigue, light sensitivity, daytime sleepiness, joint pain, achy muscles, low back pain, headache, seizure, weakness, depression, anxiety   ALLERGIES: No Known Allergies  HOME MEDICATIONS: Current Outpatient Prescriptions on File Prior to Visit  Medication Sig Dispense Refill  . Alum & Mag Hydroxide-Simeth (MAGIC MOUTHWASH W/LIDOCAINE) SOLN Take 5 mLs by mouth 3 (three) times daily as needed for mouth pain.  30 mL  1  . HYDROcodone-acetaminophen (NORCO/VICODIN) 5-325 MG per tablet Take 1 tablet by mouth every 6 (six) hours as needed for moderate pain.  15 tablet  0  . ibuprofen (ADVIL,MOTRIN) 800 MG tablet Take 800 mg  by mouth every 8 (eight) hours as needed (pain).      Marland Kitchen. lacosamide 100 MG TABS Take 1 tablet (100 mg total) by mouth 2 (two) times daily.  60 tablet  1  . OVER THE COUNTER MEDICATION Take 2 tablets by mouth daily. Joint Gummies         PAST MEDICAL HISTORY: Past Medical History  Diagnosis Date  . Hypertension   . Migraine   . Anxiety     PAST SURGICAL HISTORY: Past Surgical History  Procedure Laterality Date  . Knee surgery Right     FAMILY HISTORY: Family History  Problem Relation Age of Onset  . Hypertension Father   . Diabetes Maternal Grandmother   . Stroke Maternal Grandmother     SOCIAL HISTORY:  History   Social History  . Marital Status: Married    Spouse Name: N/A    Number of Children: 3  . Years of Education: N/A   Occupational History  . Biochemist, clinicalDetention officer.   Social History Main Topics  . Smoking status: Never Smoker   . Smokeless tobacco: Never Used  . Alcohol Use: 0.6 oz/week    1 Cans of beer per week     Comment: OCC  . Drug Use: No  . Sexual Activity: Not on file   Other Topics Concern  . Not on file   Social History Narrative  . No narrative on file     PHYSICAL EXAM   Filed Vitals:   07/26/13 1140  BP: 137/86  Pulse: 78  Height: 5\' 11"  (1.803 m)  Weight: 283 lb (128.368 kg)     Body mass index is 39.49 kg/(m^2).   Generalized: In no acute distress  Neck: Supple, no carotid bruits   Cardiac: Regular rate rhythm  Pulmonary: Clear to auscultation bilaterally  Musculoskeletal: No deformity  Neurological examination  Mentation: Alert oriented to time, place, history taking, and causual conversation, tired looking  Cranial nerve II-XII: Pupils were equal round reactive to light. Extraocular movements were full.  Visual field were full on confrontational test. Bilateral fundi were sharp.  Facial sensation and strength were normal. Hearing was intact to finger rubbing bilaterally. Uvula tongue midline.  Head turning and  shoulder shrug and were normal and symmetric.Tongue protrusion into cheek strength was normal.  Motor: Normal tone, bulk and strength, tenderness of thoracic spine upon deep palpitation, not lower back  Sensory: Intact to fine touch, pinprick, preserved vibratory sensation, and proprioception at toes.  Coordination: Normal finger to nose, heel-to-shin bilaterally there was no truncal ataxia  Gait: atalgic, cautious,   Romberg signs: Negative  Deep tendon reflexes: Brachioradialis 2/2, biceps 2/2, triceps 2/2, patellar 2/2, Achilles 2/2, plantar responses were flexor bilaterally.   DIAGNOSTIC DATA (LABS, IMAGING, TESTING) - I reviewed patient records, labs, notes, testing and imaging myself where available.  Lab Results  Component Value Date   WBC 11.1 04/18/2013   HGB 14.2 04/18/2013   HCT 41.4 04/18/2013   MCV 81.5 04/18/2013   PLT 219 04/18/2013      Component Value Date/Time   NA  140 04/17/2013 0335   K 3.7 04/17/2013 0335   CL 104 04/17/2013 0335   CO2 21 04/17/2013 0335   GLUCOSE 130 04/17/2013 0335   BUN 11 04/17/2013 0335   CREATININE 1.06 04/17/2013 0335   CALCIUM 8.6 04/17/2013 0335   PROT 7.5 04/17/2013 0335   ALBUMIN 3.4 04/19/2013 1145   AST 49 04/17/2013 0335   ALT 27 04/17/2013 0335   ALKPHOS 99 04/17/2013 0335   BILITOT 0.5 04/17/2013 0335   GFRNONAA >90 04/17/2013 0335   GFRAA >90 04/17/2013 0335    ASSESSMENT AND PLAN  Drezden Seitzinger is a 28 y.o. male with 2 generalized seizure in April 16 2013, normal workup, including MRI of brain, with normal neurological examination, EEG. He had another recurrent seizure while taking Vimpat 100 one twice a day, in June 23 2013, patient stated that he was not compliant with his medications,  1. 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 he has recurrent seizure while taking antiepileptic medications, he should continue vimpat 100mg  bid. 3. RTC in 6 months with Gerlene Fee, M.D. Ph.D.  [facility] [address], [address] [place], [place] [address] [phone]

## 2013-07-28 ENCOUNTER — Ambulatory Visit: Payer: 59 | Admitting: Physical Therapy

## 2013-09-12 ENCOUNTER — Ambulatory Visit (INDEPENDENT_AMBULATORY_CARE_PROVIDER_SITE_OTHER): Payer: Medicaid Other | Admitting: Neurology

## 2013-09-12 ENCOUNTER — Encounter: Payer: Self-pay | Admitting: Neurology

## 2013-09-12 VITALS — BP 136/84 | HR 79 | Ht 71.0 in | Wt 290.3 lb

## 2013-09-12 DIAGNOSIS — R569 Unspecified convulsions: Secondary | ICD-10-CM

## 2013-09-12 LAB — COMPREHENSIVE METABOLIC PANEL
ALBUMIN: 4.2 g/dL (ref 3.5–5.2)
ALK PHOS: 86 U/L (ref 39–117)
ALT: 21 U/L (ref 0–53)
AST: 19 U/L (ref 0–37)
BUN: 9 mg/dL (ref 6–23)
CO2: 25 mEq/L (ref 19–32)
Calcium: 8.9 mg/dL (ref 8.4–10.5)
Chloride: 105 mEq/L (ref 96–112)
Creat: 0.89 mg/dL (ref 0.50–1.35)
GLUCOSE: 96 mg/dL (ref 70–99)
Potassium: 4.2 mEq/L (ref 3.5–5.3)
Sodium: 136 mEq/L (ref 135–145)
Total Bilirubin: 0.5 mg/dL (ref 0.2–1.2)
Total Protein: 7.3 g/dL (ref 6.0–8.3)

## 2013-09-12 LAB — CBC
HCT: 45.3 % (ref 39.0–52.0)
Hemoglobin: 15.9 g/dL (ref 13.0–17.0)
MCH: 27.5 pg (ref 26.0–34.0)
MCHC: 35.1 g/dL (ref 30.0–36.0)
MCV: 78.2 fL (ref 78.0–100.0)
PLATELETS: 199 10*3/uL (ref 150–400)
RBC: 5.79 MIL/uL (ref 4.22–5.81)
RDW: 14.8 % (ref 11.5–15.5)
WBC: 7.1 10*3/uL (ref 4.0–10.5)

## 2013-09-12 LAB — TSH: TSH: 1.19 u[IU]/mL (ref 0.350–4.500)

## 2013-09-12 LAB — VITAMIN B12: Vitamin B-12: 545 pg/mL (ref 211–911)

## 2013-09-12 MED ORDER — LAMOTRIGINE ER 25 MG PO TB24: ORAL_TABLET | ORAL | Status: DC

## 2013-09-12 NOTE — Progress Notes (Signed)
NEUROLOGY CONSULTATION NOTE  Peter Barr MRN: [id number] DOB: 1985-05-01  Referring provider: Dr. Lupe Barr Primary care provider: Dr. Lupe Barr   Reason for consult:  Second opinion due to side effect of seizure medication  Dear Dr Peter Barr:  Thank you for your kind referral of Peter Barr for consultation of the above symptoms. Although his history is well known to you, please allow me to reiterate it for the purpose of our medical record. Records and images were personally reviewed where available.  HISTORY OF PRESENT ILLNESS: This is a 28 year old left-handed man with new onset seizures last 04/16/2013. He came home from a typical 13-hour 3rd shift, came home and recalls his wife fixing breakfast, then has no recollection of events until he was at [facility] ER. His wife reported that he was talking then suddenly tensed up and fell to his right side, with tonic-clonic activity repeatedly hitting his head on the child safety gate in their kitchen for 3-5 minutes. This was followed by sonorous respirations. He was brought to [facility] ER where he had a second GTC after the CT head. He was given an IV load of Cerebryx, which apparently caused burning in the IV site.  On admission, his CBC showed a WBC of 12.2, BMP unremarkable. No urine drug screen in records. His CK level was 2683, then down to 880 2 days later.  I personally reviewed MRI brain with and without contrast which was normal, hippocampi symmetric with no abnormal signal or enhancement seen. Routine EEG reported as normal. He was switched from Dilantin and discharged on Vimpat 100mg  BID.  He had seen neurologist Dr. Terrace Barr and reported continued to have back pain and paresthesias in both feet.  MRI thoracic spine was unremarkable.  He continues on meloxicam and still has the back pain. In March, he was in the ER for dizziness with sudden onset spinning sensation.  He was diagnosed with vertigo and discharged home.  He also reported  fatigue, lack of appetite, poor balance.  He had another seizure last 06/23/2013 out of sleep.  His wife witnessed a generalized convulsion, he bit his tongue on the left side.  He reports missing 2 doses of Vimpat prior.  He denies any further seizures since April 2015, denies any staring/unresponsive episodes, gaps in time, olfactory/gustatory hallucinations. 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  He has new onset pounding headaches over the bilateral temporal regions, left greater than right, occurring  2-3 times a day, lasting 10-20 minutes, with associated phonophobia. He does not take any medication for this.   He continues to have mid-back pain, taking meloxicam and prn Norco rarely.  He denies any diplopia, dysarthria, dysphagia, neck pain, bowel/bladder dysfunction.  He reports his mood is stable, denying any depression. He has been concerned about his disability paperwork because he has been "trying to get my life together" but is concerned about returning to work in his current state.  He denies any chest pains, palpitations, or anxiety.  Epilepsy Risk Factors:  His father had febrile convulsions as a child. Otherwise he had a normal birth and early development.  There is no history of febrile convulsions, CNS infections such as meningitis/encephalitis, significant traumatic brain injury, neurosurgical procedures, or family history of seizures.   PAST MEDICAL HISTORY: Past Medical History  Diagnosis Date  . Hypertension   . Migraine   .  Anxiety     PAST SURGICAL HISTORY: Past Surgical History  Procedure Laterality Date  . Knee surgery Right     MEDICATIONS: Current Outpatient Prescriptions on File Prior to Visit  Medication Sig Dispense Refill  . clindamycin (CLEOCIN T) 1 % external solution       . erythromycin ophthalmic ointment       .  HYDROcodone-acetaminophen (NORCO/VICODIN) 5-325 MG per tablet Take 1 tablet by mouth every 6 (six) hours as needed for moderate pain.  15 tablet  0  . ibuprofen (ADVIL,MOTRIN) 800 MG tablet Take 800 mg by mouth every 8 (eight) hours as needed (pain).      . Lacosamide 100 MG TABS Take 1 tablet (100 mg total) by mouth 2 (two) times daily.  60 tablet  12  . meloxicam (MOBIC) 15 MG tablet Take 15 mg by mouth daily.      Marland Kitchen. OVER THE COUNTER MEDICATION Take 2 tablets by mouth daily. Joint Gummies      . oxyCODONE-acetaminophen (PERCOCET) 5-325 MG per tablet Take 1 tablet by mouth every 6 (six) hours as needed for severe pain.  60 tablet  0  . polyethylene glycol-electrolytes (NULYTELY/GOLYTELY) 420 G solution       . sertraline (ZOLOFT) 50 MG tablet Take 1 tablet (50 mg total) by mouth daily.  30 tablet  12   No current facility-administered medications on file prior to visit.    ALLERGIES: No Known Allergies  FAMILY HISTORY: Family History  Problem Relation Age of Onset  . Hypertension Father   . Diabetes Maternal Grandmother   . Stroke Maternal Grandmother     SOCIAL HISTORY: History   Social History  . Marital Status: Married    Spouse Name: Peter Barr    Number of Children: 3  . Years of Education: college   [place]up[place]  [employer]   Social History Main Topics  . Smoking status: Never Smoker   . Smokeless tobacco: Never Used  . Alcohol Use: 0.6 oz/week    1 Cans of beer per week     Comment: OCC  . Drug Use: No  . Sexual Activity: Not on file   Other Topics Concern  . Not on file   Social History Narrative   Patient lives at home with his wife Peter Barr(Peter Barr) Patient works Full time out of work on leave.    Education some college   Left handed   Caffeine None    REVIEW OF SYSTEMS: Constitutional: No fevers, chills, or sweats, + generalized fatigue, change in appetite Eyes: No visual changes, double vision, eye pain Ear, nose and throat: No hearing loss,  ear pain, nasal congestion, sore throat Cardiovascular: No chest pain, palpitations Respiratory:  No shortness of breath at rest or with exertion, wheezes GastrointestinaI: + nausea, no vomiting, diarrhea, abdominal pain, fecal incontinence Genitourinary:  No dysuria, urinary retention or frequency Musculoskeletal:  No neck pain, + back pain Integumentary: No rash, pruritus, skin lesions Neurological: as above Psychiatric: No depression, insomnia, anxiety Endocrine: No palpitations, fatigue, diaphoresis, mood swings, change in appetite, change in weight, increased thirst Hematologic/Lymphatic:  No anemia, purpura, petechiae. Allergic/Immunologic: no itchy/runny eyes, nasal congestion, recent allergic reactions, rashes  PHYSICAL EXAM: Filed Vitals:   09/12/13 0948  BP: 136/84  Pulse: 79   General: No acute distress Head:  Normocephalic/atraumatic Eyes: Fundoscopic exam shows bilateral sharp discs, no vessel changes, exudates, or hemorrhages Neck: supple, no paraspinal tenderness, full range of motion Back: No paraspinal tenderness Heart:  regular rate and rhythm Lungs: Clear to auscultation bilaterally. Vascular: No carotid bruits. Skin/Extremities: No rash, no edema Neurological Exam: Mental status: alert and oriented to person, place, and time, no dysarthria or aphasia, Fund of knowledge is appropriate.  Recent and remote memory are intact.  Attention and concentration are normal.    Able to name objects and repeat phrases. Cranial nerves: CN I: not tested CN II: pupils equal, round and reactive to light, visual fields intact, fundi unremarkable. CN III, IV, VI:  full range of motion, no nystagmus, no ptosis CN V: decreased pin and cold on the left V1-3 distribution, did not split midline with pin or tuning fork CN VII: upper and lower face symmetric CN VIII: hearing intact to finger rub CN IX, X: gag intact, uvula midline CN XI: sternocleidomastoid and trapezius muscles  intact CN XII: tongue midline Bulk & Tone: normal, no fasciculations. Motor: 5/5 throughout with no pronator drift. Sensation: decreased cold on the right UE and LE, decreased pin on the left UE and LE.  Intact to vibration and joint position sense.  No extinction to double simultaneous stimulation.  Romberg test negative Deep Tendon Reflexes: +2 throughout, no ankle clonus Plantar responses: downgoing bilaterally Cerebellar: no incoordination on finger to nose, heel to shin. No dysdiadochokinesia Gait: narrow-based and steady, able to tandem walk adequately. Tremor: none today  IMPRESSION: This is a 28 year old left-handed man with a new onset generalized convulsions last February 2015 when he had 2 convulsions in one day.  He had another convulsive seizure last 06/23/2013 after missing 2 doses of Vimpat. The etiology of his seizures is unclear, MRI brain and routine EEG normal.  Since the seizures, he has been having dizziness, headaches, generalized fatigue and weakness, tremors.  He denies any further seizures or seizure-like symptoms. Check CBC, CMP, TSH, B12.  Although there may be a component of anxiety with his job, these may be side effects as well with the Vimpat.  He may benefit from an AED with mood stabilizing properties, and will start on a slow uptitration of Lamictal XR.  Side effects, including Levonne SpillerStevens Entsminger syndrome, were discussed.  He will continue Vimpat 100mg  BID until Lamictal is therapeutic, at which point Vimpat will be slowly tapered off. We discussed risks of breakthrough seizures with any medication adjustment.   driving laws were discussed with the patient, and he knows to stop driving after a seizure, until 6 months seizure-free. 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  He will discuss this with Dr. Terrace ArabiaYan and will let our  office know where he will follow up for neurological care moving forward.    Thank you for allowing me to participate in the care of this patient. Please do not hesitate to call for any questions or concerns.   Patrcia DollyKaren Deliana Avalos, M.D.  CC: Dr. Lupe Carneyean Mitchell

## 2013-09-12 NOTE — Patient Instructions (Signed)
1. Start Lamotrigine ER 25mg  tab: Take 1 tab daily for 2 weeks, then increase to 2 tabs daily for 2 weeks, then increase to 4 tabs daily and continue 2. Continue Vimpat 100mg  twice a day for now 3. Bloodwork for CBC, CMP, TSH, vitamin B12  Seizure Precautions: 1. If medication has been prescribed for you to prevent seizures, take it exactly as directed.  Do not stop taking the medicine without talking to your doctor first, even if you have not had a seizure in a long time.   2. Avoid activities in which a seizure would cause danger to yourself or to others.  Don't operate dangerous machinery, swim alone, or climb in high or dangerous places, such as on ladders, roofs, or girders.  Do not drive unless your doctor says you may.  3. If you have any warning that you may have a seizure, lay down in a safe place where you can't hurt yourself.    4.  No driving for 6 months from last seizure, [place].   Please refer to the following link on the Epilepsy Foundation of America's website for more information: http://www.epilepsyfoundation.org/answerplace/Social/driving/drivingu.cfm   5.  Maintain good sleep hygiene.  6.  Contact your doctor if you have any problems that may be related to the medicine you are taking.  7.  Call 911 and bring the patient back to the ED if:        A.  The seizure lasts longer than 5 minutes.       B.  The patient doesn't awaken shortly after the seizure  C.  The patient has new problems such as difficulty seeing, speaking or moving  D.  The patient was injured during the seizure  E.  The patient has a temperature over 102 F (39C)  F.  The patient vomited and now is having trouble breathing

## 2013-09-13 ENCOUNTER — Telehealth: Payer: Self-pay | Admitting: Neurology

## 2013-09-13 NOTE — Telephone Encounter (Signed)
Pt called requesting to speak with Dr. Karel JarvisAquino regarding her last visit and his disability.  C/B [phone]

## 2013-09-13 NOTE — Telephone Encounter (Signed)
Spoke with patient he just wanted to let you know that he has not stated his medication yet due to insurance and that he was forwarding his disability papers her within the next few days

## 2013-09-15 ENCOUNTER — Encounter: Payer: Self-pay | Admitting: Nurse Practitioner

## 2013-09-15 ENCOUNTER — Ambulatory Visit (INDEPENDENT_AMBULATORY_CARE_PROVIDER_SITE_OTHER): Payer: Medicaid Other | Admitting: Nurse Practitioner

## 2013-09-15 VITALS — BP 130/85 | HR 70 | Ht 71.0 in | Wt 285.0 lb

## 2013-09-15 DIAGNOSIS — R569 Unspecified convulsions: Secondary | ICD-10-CM

## 2013-09-15 DIAGNOSIS — F411 Generalized anxiety disorder: Secondary | ICD-10-CM

## 2013-09-15 DIAGNOSIS — R269 Unspecified abnormalities of gait and mobility: Secondary | ICD-10-CM

## 2013-09-15 DIAGNOSIS — F419 Anxiety disorder, unspecified: Secondary | ICD-10-CM

## 2013-09-15 DIAGNOSIS — R42 Dizziness and giddiness: Secondary | ICD-10-CM

## 2013-09-15 NOTE — Patient Instructions (Signed)
Continue Lamictal as prescribed by Dr. Karel JarvisAquino Continue Vimpat as ordered Patient will switch care to  [facility] No F/U planned

## 2013-09-15 NOTE — Progress Notes (Signed)
[facility]  PATIENT: Peter Barr DOB: 10/22/1985   REASON FOR VISIT: Followup for seizure disorder    HISTORY OF PRESENT ILLNESS: Peter Barr, 28 year old Barr returns for followup. Peter Barr was last in the office 07/26/2013 by Dr. Terrace Arabia. Peter Barr had a second opinion 09/12/2013 by Dr. Karel Jarvis at [facility] neurology. Lamictal extended release was added to his regimen for mood stabilizing purposes. Peter Barr says Peter Barr has not started the medication yet as Peter Barr is waiting for approval from his insurance company. Last seizure event occurred 4/16/ 2015 after missing a couple of doses of Vimpat. 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  MRI of the brain a routine EEG were normal. Peter Barr continues to have complaints of back pain for which she takes Norco when necessary and Mobic. Peter Barr denies double vision dysphagia, neck pain bowel or bladder dysfunction. Peter Barr does not feel Peter Barr is able to return to work. 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  In February 7th 2015, it was Saturday morning around 10 AM, Peter Barr had night shift on Friday the night before 7 PM to 7 AM, got home around 9:50am, while talking with his wife, Peter Barr had sudden loss of consciousness without warning signs, generalized tonic-clonic movement, lasting for 5 minutes, paramedic was called,  While in the emergency room around 2 PM, Peter Barr had another seizure during sleep, general tonic-clonic seizure, Peter Barr left the hospital with Vimpat 100 mg twice a day,  Laboratory showed elevated CPK 2683,  Normal MRI of brain with and without contrast, EEG, CT cervical spine showed no fracture  Peter Barr was loaded with Dilantin initially, develop IV site reaction, burning along IV line, upon discharge, Peter Barr  was switched to Vimpat 100 mg twice a day  But ever since his seizure, Peter Barr complains of upper back pain, recently also noticed bilateral feet paresthesia, gait difficulty because of the pain, no bowel bladder incontinence,  Peter Barr had x-ray of his chest at hospital, showed elevated right diaphragm,  Peter Barr now complains was every deep breath, Peter Barr had low back pain, short-winded easily  His father had seizure at age 80, associated with high fever Peter Barr has 3 children, age 24, 2, 2 months, no seizure,  UPDATE March 18th 2015:  MRI thoracic spine was normal, In March 14th,2015, Peter Barr had sudden onset dizziness, Peter Barr got off the bed, Peter Barr noticed room was spinning, felt to the floor when Peter Barr tried to walk, nauseous, could not tolerate it, was taken to ED, was diagnosed with vertigo.  Peter Barr does not feel comfortable about his body, his low back pain has much improved.  Peter Barr also developed visual hallucination, thought that his son running across room when his wife stated that it was not the case. Peter Barr also complains of fatigue, lack of appetite, gait difficulty, sometimes using cane, loose balance easily.  Peter Barr does not feel comfortable going back to work as a jail guard to put himself and others in danger,  Today, due to Apley's maneuver, Peter Barr developed severe dizziness, double vision, I was not able to appreciate nystagmus, Peter Barr also has body jerking movement, Peter Barr had a repeat EEG, that was essentially normal  UPDATE May 19th 2015:  Peter Barr is alone at today's visit, wife dropped him  off, Peter Barr had another seizure in April 16th 2015, Peter Barr was in the bed, in sleep, Peter Barr started shaking, witnessed by his wife, Peter Barr felt low back pain, right lateral tongue was sore afterwards. Peter Barr is still taking Vimpat 100 mg twice a day, Peter Barr continued to complains of low back pain, also complained of fatigue, joints pain, daytime sleepiness, frequent headaches, generalized weakness, muscle achy pain, depression, anxiety, [facility]  We have complete his paper work for short term disability, further paperwork needs to be complete by his current psychotherapist   REVIEW OF SYSTEMS: Full 14 system review of systems performed and notable only for those listed, all others are neg:  Constitutional:  fatigue  Cardiovascular: N/A  Ear/Nose/Throat: N/A  Skin: N/A  Eyes: Blurred vision Respiratory: N/A  Gastroitestinal: Abdominal pain Hematology/Lymphatic: N/A  Endocrine: N/A Musculoskeletal: Back pain, joint pain Allergy/Immunology: N/A  Neurological: Headache, dizziness, weakness Psychiatric: N/A Sleep : NA   ALLERGIES: No Known Allergies  HOME MEDICATIONS: Outpatient Prescriptions Prior to Visit  Medication Sig Dispense Refill  . clindamycin (CLEOCIN T) 1 % external solution       . erythromycin ophthalmic ointment       . HYDROcodone-acetaminophen (NORCO/VICODIN) 5-325 MG per tablet Take 1 tablet by mouth every 6 (six) hours as needed for moderate pain.  15 tablet  0  . meloxicam (MOBIC) 15 MG tablet Take 15 mg by mouth daily.      Marland Kitchen. OVER THE COUNTER MEDICATION Take 2 tablets by mouth daily. Joint Gummies      . oxyCODONE-acetaminophen (PERCOCET) 5-325 MG per tablet Take 1 tablet by mouth every 6 (six) hours as needed for severe pain.  60 tablet  0  . ibuprofen (ADVIL,MOTRIN) 800 MG tablet Take 800 mg by mouth every 8 (eight) hours as needed (pain).      . Lacosamide 100 MG TABS Take 1 tablet (100 mg total) by mouth 2 (two) times daily.  60 tablet  12  . LamoTRIgine (LAMICTAL XR) 25 MG TB24 tablet Take 1 tab daily for 2 weeks, then increase to 2 tabs daily for 2 weeks, then increase to 4 tabs daily  120 tablet  3  . polyethylene glycol-electrolytes (NULYTELY/GOLYTELY) 420 G solution       . sertraline (ZOLOFT) 50 MG tablet Take 1 tablet (50 mg total) by mouth daily.  30 tablet  12   No facility-administered medications prior to visit.    PAST MEDICAL HISTORY: Past Medical History  Diagnosis Date    . Hypertension   . Migraine   . Anxiety     PAST SURGICAL HISTORY: Past Surgical History  Procedure Laterality Date  . Knee surgery Right     FAMILY HISTORY: Family History  Problem Relation Age of Onset  . Hypertension Father   . Diabetes Maternal Grandmother   . Stroke Maternal Grandmother     SOCIAL HISTORY: History   Social History  . Marital Status: Married    Spouse Name: [name]    Number of Children: 3  . Years of Education: college   [place]up[place]  [employer]   Social History Main Topics  . Smoking status: Never Smoker   . Smokeless tobacco: Never Used  . Alcohol Use: 0.6 oz/week    1 Cans of beer per week     Comment: OCC  . Drug Use: No  . Sexual Activity: Not on file   Other Topics Concern  .  Not on file   Social History Narrative   Patient lives at home with his wife [name]) Patient works Full time out of work on leave.    Education some college   Left handed   Caffeine None     PHYSICAL EXAM  Filed Vitals:   09/15/13 0828  BP: 130/85  Pulse: 70  Height: 5\' 11"  (1.803 m)  Weight: 285 lb (129.275 kg)   Body mass index is 39.77 kg/(m^2). Generalized: In no acute distress morbidly obese Barr  Neck: Supple, no carotid bruits  Cardiac: Regular rate rhythm  Pulmonary: Clear to auscultation bilaterally  Musculoskeletal: No deformity  Neurological examination  Mentation: Alert oriented to time, place, history taking, and causual conversation, recent and remote memory intact, attention and concentration are normal. Cranial nerve II-XII: Pupils were equal round reactive to light. Extraocular movements were full. Visual field were full on confrontational test. Bilateral fundi were sharp. Decreased pinprick in the left V1-3 distribution.. Facial strength normal. Hearing was intact to finger rubbing bilaterally. Uvula tongue midline. Head turning and shoulder shrug and were normal and symmetric.Tongue protrusion into cheek  strength was normal.  Motor: Normal tone, bulk and strength, tenderness of thoracic spine upon deep palpitation, not lower back  Sensory:  decreased pinprick on the left upper extremity and lower extremity intact to vibratory and joint position no extinction.   Coordination: Normal finger to nose, heel-to-shin bilaterally there was no truncal ataxia  Gait: Narrow based and steady able to tandem without difficulty Romberg signs: Negative Reflexes: Brachioradialis 2/2, biceps 2/2, triceps 2/2, patellar 2/2, Achilles 2/2, plantar responses were flexor bilaterally. DIAGNOSTIC DATA (LABS, IMAGING, TESTING) - I reviewed patient records, labs, notes, testing and imaging myself where available.  Lab Results  Component Value Date   WBC 7.1 09/12/2013   HGB 15.9 09/12/2013   HCT 45.3 09/12/2013   MCV 78.2 09/12/2013   PLT 199 09/12/2013      Component Value Date/Time   NA 136 09/12/2013 1128   K 4.2 09/12/2013 1128   CL 105 09/12/2013 1128   CO2 25 09/12/2013 1128   GLUCOSE 96 09/12/2013 1128   BUN 9 09/12/2013 1128   CREATININE 0.89 09/12/2013 1128   CREATININE 1.06 04/17/2013 0335   CALCIUM 8.9 09/12/2013 1128   PROT 7.3 09/12/2013 1128   ALBUMIN 4.2 09/12/2013 1128   AST 19 09/12/2013 1128   ALT 21 09/12/2013 1128   ALKPHOS 86 09/12/2013 1128   BILITOT 0.5 09/12/2013 1128   GFRNONAA >90 04/17/2013 0335   GFRAA >90 04/17/2013 0335    Lab Results  Component Value Date   VITAMINB12 545 09/12/2013   Lab Results  Component Value Date   TSH 1.190 09/12/2013      ASSESSMENT AND PLAN  28 y.o. year old Barr  has a past medical history of generalized seizure disorder last February 2013 with 2 seizures in one day. Additional seizure activity 06/24/2011 after missing 2 doses of Vimpat. Etiology of seizure disorder is unclear, MRI of the brain and EEG have been normal. Second opinion with Dr. Karel Jarvis. At [facility].   Continue Lamictal as prescribed by Dr. Karel Jarvis Continue Vimpat as ordered Patient will switch care to  [facility] No F/U planned Nilda Riggs, [facility], [facility], APRN  [facility] [address] [place], [place] [address] [phone]

## 2013-09-20 ENCOUNTER — Telehealth: Payer: Self-pay | Admitting: Family Medicine

## 2013-09-20 NOTE — Telephone Encounter (Signed)
Called patient to follow-up on insurance issues with getting rx for Lamotrigine approved after we received correspondence that he needed prior authorization through [employer]. He now has medicaid and is no longer covered by [employer]. He is waiting on medicaid to approve medication once they determine that he no longer has [employer].

## 2013-10-06 ENCOUNTER — Telehealth: Payer: Self-pay | Admitting: Neurology

## 2013-10-06 NOTE — Telephone Encounter (Signed)
Pt needs to talk to someone about some paper that we may have received [phone]

## 2013-10-07 NOTE — Telephone Encounter (Signed)
Left message on machine advising patient that Dr. Karel JarvisAquino has not received any disability paper from him gave him the fax number and my name for atten.

## 2013-10-14 ENCOUNTER — Telehealth: Payer: Self-pay | Admitting: Neurology

## 2013-10-14 NOTE — Telephone Encounter (Signed)
Returned call. Patient wanted to make us aware that his employer faxed over additional paperwork for Dr. Karel JarvisAquino to fill out detailing what his limitations were. I did tell him that we haven't received the forms yet, I will be on the lookout for them.

## 2013-10-14 NOTE — Telephone Encounter (Signed)
Pt called requesting to speak to a nurse. He has questions and concerns regarding his work status. C/B [phone]

## 2013-10-17 ENCOUNTER — Telehealth: Payer: Self-pay | Admitting: Neurology

## 2013-10-17 NOTE — Telephone Encounter (Signed)
Returned call. Forms haven't been faxed yet. Told patient I will let him know when this is done.

## 2013-10-17 NOTE — Telephone Encounter (Signed)
Pt called wanting to confirm if the disability forms were faxed? C/B [phone]

## 2013-10-24 ENCOUNTER — Ambulatory Visit: Payer: Medicaid Other | Admitting: Neurology

## 2013-10-30 ENCOUNTER — Telehealth: Payer: Self-pay | Admitting: Neurology

## 2013-10-30 NOTE — Telephone Encounter (Signed)
Pt called 10/24/13 to r/s appt on the same day. Appt marked as no show because call was received the same day as his appt. Annabelle Harman has called the pt requesting a return call to r/s. A no show letter was not sent / Sherri S.

## 2013-11-17 ENCOUNTER — Telehealth: Payer: Self-pay | Admitting: Neurology

## 2013-11-17 DIAGNOSIS — Z0289 Encounter for other administrative examinations: Secondary | ICD-10-CM

## 2013-11-17 NOTE — Telephone Encounter (Signed)
Msg left to return my call

## 2013-11-17 NOTE — Telephone Encounter (Signed)
Patient returned my call. Wanted to discuss work limitations with Dr. Karel Jarvis. I did remind patient that he was due for a follow-up as he no showed his last appt. He wanted to go ahead and schedule another appt. Appt scheduled for Friday 9/18 @ 1 pm.

## 2013-11-17 NOTE — Telephone Encounter (Signed)
Pt needs to talk to someone about job limitations  Please call [phone]

## 2013-11-17 NOTE — Telephone Encounter (Signed)
Pt called f/u on the message from earlier today .c/b [phone]

## 2013-11-25 ENCOUNTER — Encounter: Payer: Self-pay | Admitting: Neurology

## 2013-11-25 ENCOUNTER — Ambulatory Visit (INDEPENDENT_AMBULATORY_CARE_PROVIDER_SITE_OTHER): Payer: Medicaid Other | Admitting: Neurology

## 2013-11-25 ENCOUNTER — Telehealth: Payer: Self-pay | Admitting: Neurology

## 2013-11-25 VITALS — BP 160/100 | HR 104 | Resp 18 | Ht 71.0 in | Wt 278.3 lb

## 2013-11-25 DIAGNOSIS — F419 Anxiety disorder, unspecified: Secondary | ICD-10-CM

## 2013-11-25 DIAGNOSIS — F411 Generalized anxiety disorder: Secondary | ICD-10-CM

## 2013-11-25 DIAGNOSIS — R569 Unspecified convulsions: Secondary | ICD-10-CM

## 2013-11-25 MED ORDER — LAMOTRIGINE ER 100 MG PO TB24: ORAL_TABLET | ORAL | Status: DC

## 2013-11-25 NOTE — Telephone Encounter (Signed)
Returned call. Pharmacist wanted to clarify if Lamictal Rx sent in today should be XR or immediate release. Checked with Dr. Karel Jarvis & Rx should be for Lamictal XR .

## 2013-11-25 NOTE — Progress Notes (Signed)
NEUROLOGY FOLLOW UP OFFICE NOTE  Peter Barr [id number]  HISTORY OF PRESENT ILLNESS: I had the pleasure of seeing Peter Barr in follow-up in the neurology clinic on 11/25/2013.  The patient was last seen 2 months ago for seizures and is accompanied by his wife today. On his initial visit, he had reported dizziness, fatigue, lack of appetite and poor balance on Vimpat.  He was started on Lamictal XR, currently on /day.  He ran out of Vimpat last week.  He had been seizure-free from April 2015 until last Sunday 11/20/2013, he was talking to his wife when he stopped mid-sentence and had a blank look. He made a weird sound, "like a screech," then started moving his body back and forth, hands clenched at his sides.  The episode lasted 30 seconds, followed by confusion for 2 minutes. He had no recollection of the episode and denied any warning symptoms.  Yesterday he had another presumed seizure, he recalls his heart had been racing the whole day, he had difficulty breathing, vision was blurred. He was diaphoretic, nauseated, then woke up on the ground with his phone underneath the truck.  His wife is concerned that he has been "strange" lately, almost daily, sometimes 1-2 times a day, he would gaze out, then ask her what she had said, lasting a minute or so.  He doesn't realize he stopped talking, but she notes he is briefly confused after.  She also expressed concern that he has been anxious regarding losing his job as well as the seizures.  He does note that the dizziness is less on Lamictal (and off Vimpat), but states he still has low energy. He does endorse anxiety regarding home and work situation. He kept feeling his chest today, stating his heart was pounding, HR 88bpm on exam.   He denies any staring/unresponsive episodes, gaps in time, olfactory/gustatory hallucinations, focal numbness/tingling/weakness.  HPI: This is a 28 yo LH man with new onset seizures last 04/16/2013. He came  home from a typical 13-hour 3rd shift, came home and recalls his wife fixing breakfast, then has no recollection of events until he was at [facility] ER. His wife reported that he was talking then suddenly tensed up and fell to his right side, with tonic-clonic activity repeatedly hitting his head on the child safety gate in their kitchen for 3-5 minutes. This was followed by sonorous respirations. He was brought to [facility] ER where he had a second GTC after the CT head. He was given an IV load of Cerebryx, which apparently caused burning in the IV site. On admission, his CBC showed a WBC of 12.2, BMP unremarkable. No urine drug screen in records. His CK level was 2683, then down to 880 2 days later. I personally reviewed MRI brain with and without contrast which was normal, hippocampi symmetric with no abnormal signal or enhancement seen. Routine EEG reported as normal. He was switched from Dilantin and discharged on Vimpat  BID. He had seen neurologist Dr. Terrace Arabia and reported continued to have back pain and paresthesias in both feet. MRI thoracic spine was unremarkable. He continues on meloxicam and still has the back pain. In March, he was in the ER for dizziness with sudden onset spinning sensation. He was diagnosed with vertigo and discharged home. He also reported fatigue, lack of appetite, poor balance. He had another seizure last 06/23/2013 out of sleep. His wife witnessed a generalized convulsion, he bit his tongue on the left side. He reports missing 2 doses of  Vimpat prior.  Epilepsy Risk Factors: His father had febrile convulsions as a child. Otherwise he had a normal birth and early development. There is no history of febrile convulsions, CNS infections such as meningitis/encephalitis, significant traumatic brain injury, neurosurgical procedures, or family history of seizures.  PAST MEDICAL HISTORY: Past Medical History  Diagnosis Date  . Hypertension   . Migraine   . Anxiety      MEDICATIONS: Current Outpatient Prescriptions on File Prior to Visit  Medication Sig Dispense Refill  . clindamycin (CLEOCIN T) 1 % external solution       . erythromycin ophthalmic ointment       . HYDROcodone-acetaminophen (NORCO/VICODIN) 5-325 MG per tablet Take 1 tablet by mouth every 6 (six) hours as needed for moderate pain.  15 tablet  0  . meloxicam (MOBIC) 15 MG tablet Take 15 mg by mouth daily.      Marland Kitchen OVER THE COUNTER MEDICATION Take 2 tablets by mouth daily. Joint Gummies      . oxyCODONE-acetaminophen (PERCOCET) 5-325 MG per tablet Take 1 tablet by mouth every 6 (six) hours as needed for severe pain.  60 tablet  0   No current facility-administered medications on file prior to visit.    ALLERGIES: No Known Allergies  FAMILY HISTORY: Family History  Problem Relation Age of Onset  . Hypertension Father   . Diabetes Maternal Grandmother   . Stroke Maternal Grandmother     SOCIAL HISTORY: History   Social History  . Marital Status: Married    Spouse Name: [name]    Number of Children: 3  . Years of Education: college   Occupational History  .  [employer]   Social History Main Topics  . Smoking status: Never Smoker   . Smokeless tobacco: Never Used  . Alcohol Use: 0.6 oz/week    1 Cans of beer per week     Comment: OCC  . Drug Use: No  . Sexual Activity: Yes    Partners: Female   Other Topics Concern  . Not on file   Social History Narrative   Patient lives at home with his wife [name]) Patient works Full time out of work on leave.    Education some college   Left handed   Caffeine None    REVIEW OF SYSTEMS: Constitutional: No fevers, chills, or sweats, no generalized fatigue, change in appetite Eyes: No visual changes, double vision, eye pain Ear, nose and throat: No hearing loss, ear pain, nasal congestion, sore throat Cardiovascular: No chest pain, palpitations Respiratory:  No shortness of breath at rest or with exertion,  wheezes GastrointestinaI: No nausea, vomiting, diarrhea, abdominal pain, fecal incontinence Genitourinary:  No dysuria, urinary retention or frequency Musculoskeletal:  No neck pain, back pain Integumentary: No rash, pruritus, skin lesions Neurological: as above Psychiatric: No depression, insomnia, +anxiety Endocrine: No palpitations, fatigue, diaphoresis, mood swings, change in appetite, change in weight, increased thirst Hematologic/Lymphatic:  No anemia, purpura, petechiae. Allergic/Immunologic: no itchy/runny eyes, nasal congestion, recent allergic reactions, rashes  PHYSICAL EXAM: Filed Vitals:   11/25/13 1225  BP: 160/100  Pulse: 104  Resp: 18   General: No acute distress but anxious, holding his chest. Initially HR was 104, went down to 88bpm while he was symptomatic Head:  Normocephalic/atraumatic Neck: supple, no paraspinal tenderness, full range of motion Heart:  Regular rate and rhythm Lungs:  Clear to auscultation bilaterally Back: No paraspinal tenderness Skin/Extremities: No rash, no edema Neurological Exam: alert and oriented to person, place, and  time. No aphasia or dysarthria. Fund of knowledge is appropriate.  Recent and remote memory are intact.  Attention and concentration are normal.    Able to name objects and repeat phrases. Cranial nerves: Pupils equal, round, reactive to light.  Fundoscopic exam unremarkable, no papilledema. Extraocular movements intact with no nystagmus. Visual fields full. Facial sensation intact. No facial asymmetry. Tongue, uvula, palate midline.  Motor: Bulk and tone normal, muscle strength 5/5 throughout with no pronator drift.  Sensation to light touch.  No extinction to double simultaneous stimulation.  Deep tendon reflexes 2+ throughout, toes downgoing.  Finger to nose testing intact but patient does it slowly with uncertainty.  Gait narrow-based and steady, able to tandem walk adequately.  Romberg negative.  IMPRESSION: This is a 28 yo  LH man with a new onset generalized convulsions last February 2015 when he had 2 convulsions in one day. He had another convulsive seizure last 06/23/2013 after missing 2 doses of Vimpat. His wife today describes seizure suggestive of complex partial seizure last 9/13. He had an unwitnessed episode of loss of consciousness with fall yesterday.  He had ran out of Vimpat last week and did not taper this, continues on Lamictal XR /day.  Breakthrough seizures likely due to sudden discontinuation of Vimpat and low Lamictal dose. He will increase Lamictal XR to  daily for 2 weeks, then /day.  We discussed side effects.  His wife reports daily episodes of staring and confusion, a 24-hour EEG will be ordered to further classify his seizures. He was noted to be easily irritated by his son in the office today, we discussed mood and anxiety, which his wife also expressed concern about. He is agreeable to seeing Behavioral Health for both medication management and psychotherapy.  We discussed seizure precautions and work limitations, no driving until 6 months seizure-free.  He will follow-up in 2 months.  Thank you for allowing me to participate in his care.  Please do not hesitate to call for any questions or concerns.  The duration of this appointment visit was 15 minutes of face-to-face time with the patient.  Greater than 50% of this time was spent in counseling, explanation of diagnosis, planning of further management, and coordination of care.   Patrcia Dolly, M.D.   CC: Dr. [name]

## 2013-11-25 NOTE — Telephone Encounter (Signed)
[facility] called to clarify script info. CB# [phone] / Sherri S.

## 2013-11-25 NOTE — Patient Instructions (Signed)
1. Increase Lamictal XR : Take 2 tablets daily for 2 weeks, then increase to 3 tablets daily 2. Schedule for 24-hour EEG 3. Refer to [facility] for both psychiatry and psychotherapy for anxiety 4. Follow-up in 2 months  Seizure Precautions: 1. If medication has been prescribed for you to prevent seizures, take it exactly as directed.  Do not stop taking the medicine without talking to your doctor first, even if you have not had a seizure in a long time.   2. Avoid activities in which a seizure would cause danger to yourself or to others.  Don't operate dangerous machinery, swim alone, or climb in high or dangerous places, such as on ladders, roofs, or girders.  Do not drive unless your doctor says you may.  3. If you have any warning that you may have a seizure, lay down in a safe place where you can't hurt yourself.    4.  No driving for 6 months from last seizure, as per [facility].   Please refer to the following link on the Epilepsy Foundation of America's website for more information: http://www.epilepsyfoundation.org/answerplace/Social/driving/drivingu.cfm   5.  Maintain good sleep hygiene.  6.  Contact your doctor if you have any problems that may be related to the medicine you are taking.  7.  Call 911 and bring the patient back to the ED if:        A.  The seizure lasts longer than 5 minutes.       B.  The patient doesn't awaken shortly after the seizure  C.  The patient has new problems such as difficulty seeing, speaking or moving  D.  The patient was injured during the seizure  E.  The patient has a temperature over 102 F (39C)  F.  The patient vomited and now is having trouble breathing

## 2013-11-28 ENCOUNTER — Other Ambulatory Visit: Payer: Self-pay | Admitting: Family Medicine

## 2013-11-28 DIAGNOSIS — F419 Anxiety disorder, unspecified: Secondary | ICD-10-CM

## 2013-12-02 ENCOUNTER — Telehealth: Payer: Self-pay | Admitting: Family Medicine

## 2013-12-02 NOTE — Telephone Encounter (Signed)
Called patient.  Lmom to return my call. Need to speak with him about disability paperwork.

## 2013-12-10 ENCOUNTER — Emergency Department (HOSPITAL_COMMUNITY)
Admission: EM | Admit: 2013-12-10 | Discharge: 2013-12-10 | Disposition: A | Discharge status: Home / Self Care | Payer: Medicaid Other | Attending: Emergency Medicine | Admitting: Emergency Medicine

## 2013-12-10 ENCOUNTER — Encounter (HOSPITAL_COMMUNITY): Payer: Self-pay | Admitting: Emergency Medicine

## 2013-12-10 ENCOUNTER — Emergency Department (HOSPITAL_COMMUNITY): Payer: Medicaid Other

## 2013-12-10 DIAGNOSIS — I10 Essential (primary) hypertension: Secondary | ICD-10-CM | POA: Insufficient documentation

## 2013-12-10 DIAGNOSIS — Z79899 Other long term (current) drug therapy: Secondary | ICD-10-CM | POA: Insufficient documentation

## 2013-12-10 DIAGNOSIS — J018 Other acute sinusitis: Secondary | ICD-10-CM

## 2013-12-10 DIAGNOSIS — Z792 Long term (current) use of antibiotics: Secondary | ICD-10-CM | POA: Diagnosis not present

## 2013-12-10 DIAGNOSIS — R51 Headache: Secondary | ICD-10-CM | POA: Diagnosis present

## 2013-12-10 DIAGNOSIS — F419 Anxiety disorder, unspecified: Secondary | ICD-10-CM | POA: Insufficient documentation

## 2013-12-10 DIAGNOSIS — R110 Nausea: Secondary | ICD-10-CM | POA: Insufficient documentation

## 2013-12-10 DIAGNOSIS — G43909 Migraine, unspecified, not intractable, without status migrainosus: Secondary | ICD-10-CM | POA: Diagnosis not present

## 2013-12-10 LAB — BASIC METABOLIC PANEL
Anion gap: 11 (ref 5–15)
BUN: 14 mg/dL (ref 6–23)
CALCIUM: 9.4 mg/dL (ref 8.4–10.5)
CO2: 26 mEq/L (ref 19–32)
Chloride: 100 mEq/L (ref 96–112)
Creatinine, Ser: 1.25 mg/dL (ref 0.50–1.35)
GFR calc Af Amer: >90 mL/min (ref >90)
GFR, EST NON AFRICAN AMERICAN: 78 mL/min — AB (ref >90)
GLUCOSE: 94 mg/dL (ref 70–99)
Potassium: 4 mEq/L (ref 3.7–5.3)
SODIUM: 137 meq/L (ref 137–147)

## 2013-12-10 LAB — CBC WITH DIFFERENTIAL/PLATELET
BASOS ABS: 0 10*3/uL (ref 0.0–0.1)
Basophils Relative: 0 % (ref 0–1)
EOS PCT: 2 % (ref 0–5)
Eosinophils Absolute: 0.3 10*3/uL (ref 0.0–0.7)
HCT: 44.9 % (ref 39.0–52.0)
Hemoglobin: 15 g/dL (ref 13.0–17.0)
LYMPHS ABS: 1.6 10*3/uL (ref 0.7–4.0)
Lymphocytes Relative: 11 % — ABNORMAL LOW (ref 12–46)
MCH: 26.5 pg (ref 26.0–34.0)
MCHC: 33.4 g/dL (ref 30.0–36.0)
MCV: 79.2 fL (ref 78.0–100.0)
Monocytes Absolute: 1.5 10*3/uL — ABNORMAL HIGH (ref 0.1–1.0)
Monocytes Relative: 10 % (ref 3–12)
Neutro Abs: 11.1 10*3/uL — ABNORMAL HIGH (ref 1.7–7.7)
Neutrophils Relative %: 77 % (ref 43–77)
PLATELETS: 192 10*3/uL (ref 150–400)
RBC: 5.67 MIL/uL (ref 4.22–5.81)
RDW: 14 % (ref 11.5–15.5)
WBC: 14.5 10*3/uL — ABNORMAL HIGH (ref 4.0–10.5)

## 2013-12-10 MED ORDER — AMOXICILLIN 500 MG PO CAPS
500.0000 mg | ORAL_CAPSULE | Freq: Three times a day (TID) | ORAL | Status: DC
  Administered 2013-12-10: 500 mg via ORAL
  Filled 2013-12-10: qty 1

## 2013-12-10 MED ORDER — HYDROMORPHONE HCL 1 MG/ML IJ SOLN
1.0000 mg | Freq: Once | INTRAMUSCULAR | Status: AC
  Administered 2013-12-10: 1 mg via INTRAVENOUS
  Filled 2013-12-10: qty 1

## 2013-12-10 MED ORDER — AMOXICILLIN 500 MG PO CAPS: 500.0000 mg | ORAL_CAPSULE | Freq: Three times a day (TID) | ORAL | Status: DC

## 2013-12-10 MED ORDER — ONDANSETRON HCL 4 MG/2ML IJ SOLN
4.0000 mg | Freq: Once | INTRAMUSCULAR | Status: AC
  Administered 2013-12-10: 4 mg via INTRAVENOUS
  Filled 2013-12-10: qty 2

## 2013-12-10 MED ORDER — SODIUM CHLORIDE 0.9 % IV SOLN
INTRAVENOUS | Status: DC
  Administered 2013-12-10: 18:00:00 via INTRAVENOUS

## 2013-12-10 MED ORDER — SODIUM CHLORIDE 0.9 % IV BOLUS (SEPSIS)
1000.0000 mL | Freq: Once | INTRAVENOUS | Status: AC
  Administered 2013-12-10: 1000 mL via INTRAVENOUS

## 2013-12-10 MED ORDER — ONDANSETRON 4 MG PO TBDP
8.0000 mg | ORAL_TABLET | Freq: Once | ORAL | Status: AC
  Administered 2013-12-10: 8 mg via ORAL
  Filled 2013-12-10: qty 2

## 2013-12-10 MED ORDER — HYDROCODONE-ACETAMINOPHEN 5-325 MG PO TABS: 1.0000 | ORAL_TABLET | Freq: Four times a day (QID) | ORAL | Status: DC | PRN

## 2013-12-10 NOTE — ED Notes (Signed)
Pt called out wanting to speak with a nurse. This RN went into pt's room, and observed that the pt was silent, and staring around in confusion. Pt states that he has started shaking, that he feels dizzy, and that he needs to urinate. When this RN asked if the pt could walk to the bathroom, he stated "No. Cause if I get up, I'm going to fall to the floor". Pt given a urinal.

## 2013-12-10 NOTE — ED Notes (Signed)
Patient transported to CT 

## 2013-12-10 NOTE — ED Provider Notes (Signed)
CSN: [id number]     Arrival date & time 12/10/13  1414 History   First MD Initiated Contact with Patient 12/10/13 1621     Chief Complaint  Patient presents with  . Headache     (Consider location/radiation/quality/duration/timing/severity/associated sxs/prior Treatment) Patient is a 28 y.o. male presenting with headaches. The history is provided by the patient.  Headache Associated symptoms: congestion, dizziness, eye pain and nausea   Associated symptoms: no abdominal pain, no back pain, no fever, no sinus pressure, no sore throat and no vomiting    patient complains of the headache it is right-sided for head moving to the back of the head and pain behind both eyes. Some discomfort in the head area with pressure for the past 3 days but today became severe associated with some congestion. 10 out of 10. Associated with some nausea and photosensitivity. Low bit of blurred vision. Patient stated he did bump his head with his 21-month-old of 2 days ago but think this is probably not related. Associated with some mild nausea but no vomiting. No fevers.  Past Medical History  Diagnosis Date  . Hypertension   . Migraine   . Anxiety    Past Surgical History  Procedure Laterality Date  . Knee surgery Right    Family History  Problem Relation Age of Onset  . Hypertension Father   . Diabetes Maternal Grandmother   . Stroke Maternal Grandmother    History  Substance Use Topics  . Smoking status: Never Smoker   . Smokeless tobacco: Never Used  . Alcohol Use: 0.6 oz/week    1 Cans of beer per week     Comment: OCC    Review of Systems  Constitutional: Negative for fever.  HENT: Positive for congestion. Negative for sinus pressure and sore throat.   Eyes: Positive for pain and visual disturbance. Negative for redness.  Respiratory: Negative for shortness of breath.   Cardiovascular: Negative for chest pain.  Gastrointestinal: Positive for nausea. Negative for vomiting and abdominal  pain.  Genitourinary: Negative for dysuria.  Musculoskeletal: Negative for back pain.  Skin: Negative for rash.  Neurological: Positive for dizziness. Negative for headaches.  Hematological: Does not bruise/bleed easily.      Allergies  Depakote er  Home Medications   Prior to Admission medications   Medication Sig Start Date End Date Taking? Authorizing Provider  ibuprofen (ADVIL,MOTRIN) 200 MG tablet Take 400 mg by mouth every 6 (six) hours as needed for headache.   Yes Historical Provider, MD  lamoTRIgine (LAMICTAL) 100 MG tablet Take 300 mg by mouth daily.   Yes Historical Provider, MD  meloxicam (MOBIC) 15 MG tablet Take 15 mg by mouth daily. 04/27/13  Yes Historical Provider, MD  montelukast (SINGULAIR) 10 MG tablet Take 10 mg by mouth at bedtime.   Yes Historical Provider, MD  Naproxen Sod-Diphenhydramine (ALEVE PM PO) Take 2 tablets by mouth at bedtime as needed (sleep / headache).   Yes Historical Provider, MD  amoxicillin (AMOXIL) 500 MG capsule Take 1 capsule (500 mg total) by mouth 3 (three) times daily. 12/10/13   Vanetta Mulders, MD  clindamycin (CLEOCIN) 150 MG capsule Take 150 mg by mouth 2 (two) times daily.    Historical Provider, MD  HYDROcodone-acetaminophen (NORCO/VICODIN) 5-325 MG per tablet Take 1-2 tablets by mouth every 6 (six) hours as needed for moderate pain. 12/10/13   Vanetta Mulders, MD   BP 144/84  Pulse 100  Temp(Src) 98.3 F (36.8 C) (Oral)  Resp 20  Ht 5\' 11"  (1.803 m)  Wt 254 lb (115.214 kg)  BMI 35.44 kg/m2  SpO2 100% Physical Exam  Nursing note and vitals reviewed. Constitutional: He is oriented to person, place, and time. He appears well-developed and well-nourished. No distress.  HENT:  Head: Normocephalic and atraumatic.  Mouth/Throat: Oropharynx is clear and moist.  Eyes: Conjunctivae and EOM are normal. Pupils are equal, round, and reactive to light.  Neck: Normal range of motion.  Cardiovascular: Normal rate, regular rhythm and  normal heart sounds.   No murmur heard. Pulmonary/Chest: Effort normal and breath sounds normal. No respiratory distress.  Abdominal: Soft. Bowel sounds are normal. There is no tenderness.  Musculoskeletal: Normal range of motion.  Neurological: He is alert and oriented to person, place, and time. No cranial nerve deficit. He exhibits normal muscle tone. Coordination normal.  Skin: Skin is warm. No rash noted.    ED Course  Procedures (including critical care time) Labs Review Labs Reviewed  CBC WITH DIFFERENTIAL - Abnormal; Notable for the following:    WBC 14.5 (*)    Neutro Abs 11.1 (*)    Lymphocytes Relative 11 (*)    Monocytes Absolute 1.5 (*)    All other components within normal limits  BASIC METABOLIC PANEL - Abnormal; Notable for the following:    GFR calc non Af Amer 78 (*)    All other components within normal limits   Results for orders placed during the hospital encounter of 12/10/13  CBC WITH DIFFERENTIAL      Result Value Ref Range   WBC 14.5 (*) 4.0 - 10.5 K/uL   RBC 5.67  4.22 - 5.81 MIL/uL   Hemoglobin 15.0  13.0 - 17.0 g/dL   HCT 04.544.9  40.939.0 - 81.152.0 %   MCV 79.2  78.0 - 100.0 fL   MCH 26.5  26.0 - 34.0 pg   MCHC 33.4  30.0 - 36.0 g/dL   RDW 91.414.0  78.211.5 - 95.615.5 %   Platelets 192  150 - 400 K/uL   Neutrophils Relative % 77  43 - 77 %   Neutro Abs 11.1 (*) 1.7 - 7.7 K/uL   Lymphocytes Relative 11 (*) 12 - 46 %   Lymphs Abs 1.6  0.7 - 4.0 K/uL   Monocytes Relative 10  3 - 12 %   Monocytes Absolute 1.5 (*) 0.1 - 1.0 K/uL   Eosinophils Relative 2  0 - 5 %   Eosinophils Absolute 0.3  0.0 - 0.7 K/uL   Basophils Relative 0  0 - 1 %   Basophils Absolute 0.0  0.0 - 0.1 K/uL  BASIC METABOLIC PANEL      Result Value Ref Range   Sodium 137  137 - 147 mEq/L   Potassium 4.0  3.7 - 5.3 mEq/L   Chloride 100  96 - 112 mEq/L   CO2 26  19 - 32 mEq/L   Glucose, Bld 94  70 - 99 mg/dL   BUN 14  6 - 23 mg/dL   Creatinine, Ser 2.131.25  0.50 - 1.35 mg/dL   Calcium 9.4  8.4 -  08.610.5 mg/dL   GFR calc non Af Amer 78 (*) >90 mL/min   GFR calc Af Amer >90  >90 mL/min   Anion gap 11  5 - 15     Imaging Review Ct Head Wo Contrast  12/10/2013   CLINICAL DATA:  Head pressure and dizziness today, had perioral numbness yesterday, stumbling and dizzy today, difficulty speaking today, history  of seizures  EXAM: CT HEAD WITHOUT CONTRAST  TECHNIQUE: Contiguous axial images were obtained from the base of the skull through the vertex without intravenous contrast.  COMPARISON:  04/18/2013 MRI and 04/16/2013 CT scan  FINDINGS: No evidence of hemorrhage or extra-axial fluid. No infarct or mass. No hydrocephalus. New from the prior study is moderate to severe circumferential opacification of the right maxillary sinus, with dense opacification of most of the anterior and posterior ethmoid air cells on the right and opacification of the right frontal sinus. Left-sided sinuses and sphenoid sinuses bilaterally are clear. Calvarium is intact.  IMPRESSION: Severe right frontal, ethmoidal, and maxillary sinusitis.   Electronically Signed   By: Esperanza Heir M.D.   On: 12/10/2013 17:50     EKG Interpretation None      MDM   Final diagnoses:  Other acute sinusitis    Patient with headache predominantly right-sided and behind the eyes bilaterally seemed like a possibly could be migraine related. Patient doesn't have a history of migraines despite what his past medical history states. Head CT shows a pansinusitis. Patient is febrile here. We'll treat with amoxicillin. Patient primary care Dr. to followup with. Patient will return for any newer worse symptoms. Work note provided.    Vanetta Mulders, MD 12/10/13 2230

## 2013-12-10 NOTE — Discharge Instructions (Signed)
Sinusitis Sinusitis is redness, soreness, and puffiness (inflammation) of the air pockets in the bones of your face (sinuses). The redness, soreness, and puffiness can cause air and mucus to get trapped in your sinuses. This can allow germs to grow and cause an infection.  HOME CARE   Drink enough fluids to keep your pee (urine) clear or pale yellow.  Use a humidifier in your home.  Run a hot shower to create steam in the bathroom. Sit in the bathroom with the door closed. Breathe in the steam 3-4 times a day.  Put a warm, moist washcloth on your face 3-4 times a day, or as told by your doctor.  Use salt water sprays (saline sprays) to wet the thick fluid in your nose. This can help the sinuses drain.  Only take medicine as told by your doctor. GET HELP RIGHT AWAY IF:   Your pain gets worse.  You have very bad headaches.  You are sick to your stomach (nauseous).  You throw up (vomit).  You are very sleepy (drowsy) all the time.  Your face is puffy (swollen).  Your vision changes.  You have a stiff neck.  You have trouble breathing. MAKE SURE YOU:   Understand these instructions.  Will watch your condition.  Will get help right away if you are not doing well or get worse. Document Released: 08/13/2007 Document Revised: 11/19/2011 Document Reviewed: 09/30/2011 [facility] Patient Information 2015 [employer], [place]. This information is not intended to replace advice given to you by your health care provider. Make sure you discuss any questions you have with your health care provider.  Head CT consistent with sinusitis. Take antibiotic as directed take pain medicine as needed. Work note provided. Followup with your doctor in the next 2 days. Return for any newer worse symptoms.

## 2013-12-10 NOTE — ED Notes (Signed)
Pt reports that he had an episode of emesis.

## 2013-12-10 NOTE — ED Notes (Signed)
Pt presents to department for evaluation of head pressure, ongoing x3 days. 10/10 pain at the time. Also states nausea and photosensitivity. Pt states son accidentally bumped him in head several days ago, pain started after. Pt is alert and oriented x4.

## 2013-12-10 NOTE — ED Notes (Signed)
Pt reporting sudden onset dizziness and nausea. A/O x 4.

## 2013-12-10 NOTE — ED Notes (Signed)
MD at bedside. 

## 2013-12-19 ENCOUNTER — Telehealth: Payer: Self-pay | Admitting: Neurology

## 2013-12-19 NOTE — Telephone Encounter (Signed)
Pt states that someone called him about appt tomorrow please call [phone]

## 2013-12-20 ENCOUNTER — Ambulatory Visit (INDEPENDENT_AMBULATORY_CARE_PROVIDER_SITE_OTHER): Payer: Medicaid Other | Admitting: Neurology

## 2013-12-20 DIAGNOSIS — R569 Unspecified convulsions: Secondary | ICD-10-CM

## 2013-12-23 NOTE — Procedures (Signed)
ELECTROENCEPHALOGRAM REPORT  Dates of Recording: 12/20/2013 to 12/21/2013  Patient's Name: Peter Barr MRN: [id number] Date of Birth: [birth year]  Referring Provider: Dr. Patrcia DollyKaren [name]  Procedure: 24-hour ambulatory EEG  History: This is a 28 year old man with a new onset generalized convulsions last February 2015 when he had 2 convulsions in one day. Since then he has had recurrent convulsions and episodes of behavioral arrest with body rocking. EEG for classification.  Medications: Lamictal  Technical Summary: This is a 24-hour multichannel digital EEG recording measured by the international 10-20 system with electrodes applied with paste and impedances below 5000 ohms performed as portable with EKG monitoring.  The digital EEG was referentially recorded, reformatted, and digitally filtered in a variety of bipolar and referential montages for optimal display.    DESCRIPTION OF RECORDING: During maximal wakefulness, the background activity consisted of a symmetric 10 Hz posterior dominant rhythm which was reactive to eye opening.  There were no epileptiform discharges or focal slowing seen in wakefulness.  During the recording, the patient progresses through wakefulness, drowsiness, and Stage 2 sleep. During sleep, there is occasional focal 3-4 Hz slowing seen over the left temporal region.  There are occasional left temporal sharp waves with aftergoing slow wave seen.     Events: There were no push button events. Patient did not complete diary.    There were no electrographic seizures seen.  EKG lead was unremarkable.  IMPRESSION: This 24-hour ambulatory EEG study is abnormal due to the presence of: 1. Occasional focal slowing over the left temporal region seen exclusively in sleep 2. Occasional epileptiform discharges over the left temporal region seen exclusively in sleep  CLINICAL CORRELATION of the above findings indicates focal cerebral dysfunction over the left temporal  region with possible epileptogenic potential.  There were no electrographic seizures seen.   Patrcia DollyKaren [name], M.D.

## 2014-01-02 ENCOUNTER — Telehealth: Payer: Self-pay | Admitting: Neurology

## 2014-01-02 NOTE — Telephone Encounter (Signed)
Left VM to see how patient doing on higher dose Lamictal. His ambulatory EEG did not show any seizures. There were occasional left temporal sharp waves seen exclusively in sleep.

## 2014-01-06 NOTE — Telephone Encounter (Signed)
Discussed EEG showing left temporal interictal discharges. He is feeling well on Lamictal, a little dizzy but nothing significant. Still some back pain. No seizures. Continue current dose of Lamictal.

## 2014-01-16 ENCOUNTER — Telehealth: Payer: Self-pay | Admitting: Neurology

## 2014-01-16 NOTE — Telephone Encounter (Signed)
Pt needs to talk to someone about him being dizzy and headache on the left side please call [phone] or [phone]

## 2014-01-16 NOTE — Telephone Encounter (Signed)
Did speak with patient. He wanted to make us aware that earlier today he started having some dizziness & headache. He did say he came home to lay down and sxs are subsiding. He is already taking mobic so I advised that he take some Tylenol to see if that helped his headache. I advised that if he didn't get any better or worse throughout the evening that he should go to the ER. He does have a f/u with Dr. Karel JarvisAquino on 11/18.

## 2014-01-25 ENCOUNTER — Ambulatory Visit
Admission: RE | Admit: 2014-01-25 | Discharge: 2014-01-25 | Disposition: A | Discharge status: Home / Self Care | Payer: Medicaid Other | Source: Ambulatory Visit | Attending: Neurology | Admitting: Neurology

## 2014-01-25 ENCOUNTER — Encounter: Payer: Self-pay | Admitting: Neurology

## 2014-01-25 ENCOUNTER — Ambulatory Visit (INDEPENDENT_AMBULATORY_CARE_PROVIDER_SITE_OTHER): Payer: Medicaid Other | Admitting: Neurology

## 2014-01-25 VITALS — BP 134/82 | HR 91 | Resp 18 | Ht 71.0 in | Wt 279.0 lb

## 2014-01-25 DIAGNOSIS — M545 Low back pain, unspecified: Secondary | ICD-10-CM

## 2014-01-25 DIAGNOSIS — G40009 Localization-related (focal) (partial) idiopathic epilepsy and epileptic syndromes with seizures of localized onset, not intractable, without status epilepticus: Secondary | ICD-10-CM

## 2014-01-25 DIAGNOSIS — G43009 Migraine without aura, not intractable, without status migrainosus: Secondary | ICD-10-CM

## 2014-01-25 MED ORDER — NORTRIPTYLINE HCL 10 MG PO CAPS: 10.0000 mg | ORAL_CAPSULE | Freq: Every day | ORAL | Status: DC

## 2014-01-25 MED ORDER — LAMOTRIGINE ER 100 MG PO TB24: ORAL_TABLET | ORAL | Status: DC

## 2014-01-25 NOTE — Progress Notes (Signed)
NEUROLOGY FOLLOW UP OFFICE NOTE  Peter Barr [id number]  HISTORY OF PRESENT ILLNESS: I had the pleasure of seeing Peter Barr in follow-up in the neurology clinic on 01/25/2014.  The patient was last seen 2 months ago for recurrent seizures. Records and images were personally reviewed where available.  I personally reviewed 24-hour EEG which showed occasional focal slowing and epileptiform discharges over the left temporal region seen exclusively in sleep. He had a complex partial seizure on 09/13 and his wife had reported daily staring episodes. He had been instructed to increase Lamictal XR gradually to 300mg /day, however he was unable to do this, and has been taking Lamictal XR 25mg  5 tablets daily instead. He denies any seizures since September, however has been having dizziness and headaches. He was diagnosed with sinusitis last month. Around 2 weeks ago, he was in class and stood up, followed by a spinning sensation where he felt hot. He felt like something was "taking over my body," he could hear but people sounded far away. He tried to stand but fell back in the chair. This lasted around 30 minutes, with associated nausea and headaches. It happened again the next day. He has periodic mild dizziness throughout the day, better than when he was taking Vimpat, and reports this is improving. His vision feels blurred. The headaches are described as throbbing pressure over the frontal region, with assocaited nausea, photo, and phonophobia, lasting at least an hour. He would usually sleep and denies taking any medication except Mobic for back pain since the seizure. His father had migraines. Pain is over the lower back, no bowel/bladder dysfunction. We had discussed mood changes on his last visit, he reports mood is fair, he is "on the edge" because of school and currently being unermployed, and has not seen Behavioral Health yet.  HPI: This is a 28 yo LH man with new onset seizures last  04/16/2013. He came home from a typical 13-hour 3rd shift, came home and recalls his wife fixing breakfast, then has no recollection of events until he was at [facility] ER. His wife reported that he was talking then suddenly tensed up and fell to his right side, with tonic-clonic activity repeatedly hitting his head on the child safety gate in their kitchen for 3-5 minutes. This was followed by sonorous respirations. He was brought to [facility] ER where he had a second GTC after the CT head. He was given an IV load of Cerebryx, which apparently caused burning in the IV site. He was switched from Dilantin and discharged on Vimpat 100mg  BID. He had seen neurologist Dr. Terrace Arabia and reported continued to have back pain and paresthesias in both feet. MRI thoracic spine was unremarkable. He continues on meloxicam and still has the back pain. In March, he was in the ER for dizziness with sudden onset spinning sensation. He was diagnosed with vertigo and discharged home. He also reported fatigue, lack of appetite, poor balance.   He had a nocturnal convulsion in April 2015. In September 2015, he had a complex partial seizure with behavioral arrest, followed by a weird sound, "like a screech," then started moving his body back and forth, hands clenched at his sides. The episode lasted 30 seconds, followed by confusion for 2 minutes. He had no recollection of the episode and denied any warning symptoms. His wife expressed concern that he has been "strange" lately, almost daily, sometimes 1-2 times a day, he would gaze out, then ask her what she had said, lasting a  minute or so. He doesn't realize he stopped talking, but she notes he is briefly confused after.   Epilepsy Risk Factors: His father had febrile convulsions as a child. Otherwise he had a normal birth and early development. There is no history of febrile convulsions, CNS infections such as meningitis/encephalitis, significant traumatic brain injury, neurosurgical  procedures, or family history of seizures. Diagnostic Data: I personally reviewed MRI brain with and without contrast which was normal, hippocampi symmetric with no abnormal signal or enhancement seen. Routine EEG reported as normal. 24-hour EEG showed occasional focal left temporal slowing and left temporal epileptiform discharges in sleep. No typical events captured.  AST MEDICAL HISTORY: Past Medical History  Diagnosis Date  . Hypertension   . Migraine   . Anxiety     MEDICATIONS: Current Outpatient Prescriptions on File Prior to Visit  Medication Sig Dispense Refill  . ibuprofen (ADVIL,MOTRIN) 200 MG tablet Take 400 mg by mouth every 6 (six) hours as needed for headache.    . meloxicam (MOBIC) 15 MG tablet Take 15 mg by mouth daily.    . montelukast (SINGULAIR) 10 MG tablet Take 10 mg by mouth at bedtime.    Lamictal XR 25mg  5 tablets daily No current facility-administered medications on file prior to visit.    ALLERGIES: Allergies  Allergen Reactions  . Depakote Er [Divalproex Sodium Er] Other (See Comments)    Caused burns on arm    FAMILY HISTORY: Family History  Problem Relation Age of Onset  . Hypertension Father   . Diabetes Maternal Grandmother   . Stroke Maternal Grandmother     SOCIAL HISTORY: History   Social History  . Marital Status: Married    Spouse Name: [name]    Number of Children: 3  . Years of Education: college   Occupational History  [place]  [employer]   Social History Barr Topics  . Smoking status: Never Smoker   . Smokeless tobacco: Never Used  . Alcohol Use: 0.6 oz/week    1 Cans of beer per week     Comment: OCC  . Drug Use: No  . Sexual Activity:    Partners: Female   Other Topics Concern  . Not on file   Social History Narrative   Patient lives at home with his wife [name]([name]) Patient works Full time out of work on leave.    Education some college   Left handed   Caffeine None    REVIEW OF SYSTEMS: Constitutional: No  fevers, chills, or sweats, no generalized fatigue, change in appetite Eyes: No visual changes, double vision, eye pain Ear, nose and throat: No hearing loss, ear pain, nasal congestion, sore throat Cardiovascular: No chest pain, palpitations Respiratory:  No shortness of breath at rest or with exertion, wheezes GastrointestinaI: No nausea, vomiting, diarrhea, abdominal pain, fecal incontinence Genitourinary:  No dysuria, urinary retention or frequency Musculoskeletal:  No neck pain,+ back pain Integumentary: No rash, pruritus, skin lesions Neurological: as above Psychiatric: No depression, insomnia, anxiety Endocrine: No palpitations, fatigue, diaphoresis, mood swings, change in appetite, change in weight, increased thirst Hematologic/Lymphatic:  No anemia, purpura, petechiae. Allergic/Immunologic: no itchy/runny eyes, nasal congestion, recent allergic reactions, rashes  PHYSICAL EXAM: Filed Vitals:   01/25/14 1435  BP: 134/82  Pulse: 91  Resp: 18   General: No acute distress Head:  Normocephalic/atraumatic Neck: supple, no paraspinal tenderness, full range of motion Heart:  Regular rate and rhythm Lungs:  Clear to auscultation bilaterally Back: No paraspinal tenderness Skin/Extremities: No rash, no  edema Neurological Exam: alert and oriented to person, place, and time. No aphasia or dysarthria. Fund of knowledge is appropriate.  Recent and remote memory are intact.  Attention and concentration are normal.    Able to name objects and repeat phrases. Cranial nerves: Pupils equal, round, reactive to light.  Fundoscopic exam unremarkable, no papilledema. Extraocular movements intact with no nystagmus. Visual fields full. Facial sensation intact. No facial asymmetry. Tongue, uvula, palate midline.  Motor: Bulk and tone normal, muscle strength 5/5 throughout with no pronator drift.  Sensation to light touch intact.  No extinction to double simultaneous stimulation.  Deep tendon reflexes 2+  throughout, toes downgoing.  Finger to nose testing intact.  Gait narrow-based and steady, able to tandem walk adequately.  Romberg negative.  IMPRESSION: This is a 28 yo LH man with a new onset generalized convulsions last February 2015 when he had 2 convulsions in one day. He had another convulsive seizure last 06/23/2013 after missing 2 doses of Vimpat. His wife also describes seizure suggestive of complex partial seizure last 9/13. His 24-hour EEG showed left temporal epileptiform discharges, MRI brain normal. 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 He did not increase Lamictal as instructed, but does report dizziness and blurred vision. Check Lamictal level. We will plan to further increase Lamictal XR to 200mg  daily, however option to switch to a different AED was discussed with the patient. He would like to continue Lamictal for now since he has not had any seizures since September. He now has headaches with migrainous features and will start nortriptyline for headache prophylaxis. Side effects were discussed. He continues to have low back pain since the first seizure, lumbar xray will be ordered. He may benefit from PT. He will follow-up in 2 months. [place] driving laws were discussed with the patient and he knows not to drive until 6 months seizure-free.  Thank you for allowing me to participate in his care.  Please do not hesitate to call for any questions or concerns.  The duration of this appointment visit was 25 minutes of face-to-face time with the patient.  Greater than 50% of this time was spent in counseling, explanation of diagnosis, planning of further management, and coordination of care.   Peter Barr, M.D.   CC: Dr. Clovis RileyMitchell

## 2014-01-25 NOTE — Patient Instructions (Addendum)
1. Bloodwork for Lamictal level 2. Schedule lumbar xray for back pain 3. Increase Lamictal XR 100mg : Take 2 tablets daily 4. Start nortriptyline 10mg : Take 1 capsule at bedtime to help with headaches 5. Follow-up in 2 months

## 2014-01-26 ENCOUNTER — Telehealth: Payer: Self-pay | Admitting: Neurology

## 2014-01-26 NOTE — Telephone Encounter (Signed)
recv'd records request from [employer] by mail on 01/24/14. Request forwarded via inter-office mail to HIM at [facility] for processing / Sherri S.

## 2014-01-28 ENCOUNTER — Encounter: Payer: Self-pay | Admitting: Neurology

## 2014-01-30 ENCOUNTER — Other Ambulatory Visit: Payer: Self-pay | Admitting: Family Medicine

## 2014-01-30 LAB — LAMOTRIGINE LEVEL: LAMOTRIGINE LVL: 2 ug/mL — AB (ref 4.0–18.0)

## 2014-01-30 MED ORDER — LAMOTRIGINE ER 200 MG PO TB24: 200.0000 mg | ORAL_TABLET | Freq: Every day | ORAL | Status: DC

## 2014-02-08 ENCOUNTER — Encounter: Payer: Self-pay | Admitting: Neurology

## 2014-02-21 ENCOUNTER — Telehealth: Payer: Self-pay | Admitting: Neurology

## 2014-02-21 NOTE — Telephone Encounter (Signed)
recv'd request for records by mail from [employer]. Request forwarded by inter-office mail to HIM at [facility] for processing / Sherri S.

## 2014-03-13 ENCOUNTER — Other Ambulatory Visit: Payer: Self-pay | Admitting: Neurology

## 2014-03-27 ENCOUNTER — Ambulatory Visit (INDEPENDENT_AMBULATORY_CARE_PROVIDER_SITE_OTHER): Payer: Medicaid Other | Admitting: Neurology

## 2014-03-27 ENCOUNTER — Encounter: Payer: Self-pay | Admitting: Neurology

## 2014-03-27 VITALS — BP 140/82 | HR 88 | Ht 71.0 in | Wt 285.0 lb

## 2014-03-27 DIAGNOSIS — F329 Major depressive disorder, single episode, unspecified: Secondary | ICD-10-CM

## 2014-03-27 DIAGNOSIS — G43009 Migraine without aura, not intractable, without status migrainosus: Secondary | ICD-10-CM

## 2014-03-27 DIAGNOSIS — F32A Depression, unspecified: Secondary | ICD-10-CM

## 2014-03-27 DIAGNOSIS — G40009 Localization-related (focal) (partial) idiopathic epilepsy and epileptic syndromes with seizures of localized onset, not intractable, without status epilepticus: Secondary | ICD-10-CM

## 2014-03-27 MED ORDER — LAMOTRIGINE ER 100 MG PO TB24: ORAL_TABLET | ORAL | Status: DC

## 2014-03-27 NOTE — Patient Instructions (Addendum)
1. Increase Lamictal XR: Take Lamictal XR 100mg  3 tablets daily 2. Continue notrtipyline 10mg  at bedtime 3. [facility] 4. As per [place] driving laws, one should not drive until 6 months seizure-free 5. Follow-up in 3 months  ------[facility] [address] [place], [place] [address] [phone]. For initial consultation you can walk-in Monday-Friday 8am-3pm, any sessions after that would be made by appointment only.--------   Seizure Precautions: 1. If medication has been prescribed for you to prevent seizures, take it exactly as directed.  Do not stop taking the medicine without talking to your doctor first, even if you have not had a seizure in a long time.   2. Avoid activities in which a seizure would cause danger to yourself or to others.  Don't operate dangerous machinery, swim alone, or climb in high or dangerous places, such as on ladders, roofs, or girders.  Do not drive unless your doctor says you may.  3. If you have any warning that you may have a seizure, lay down in a safe place where you can't hurt yourself.    4.  No driving for 6 months from last seizure, as per [facility] [place] [place] law.   Please refer to the following link on the Epilepsy Foundation of America's website for more information: http://www.epilepsyfoundation.org/answerplace/Social/driving/drivingu.cfm   5.  Maintain good sleep hygiene.  6.  Contact your doctor if you have any problems that may be related to the medicine you are taking.  7.  Call 911 and bring the patient back to the ED if:        A.  The seizure lasts longer than 5 minutes.       B.  The patient doesn't awaken shortly after the seizure  C.  The patient has new problems such as difficulty seeing, speaking or moving  D.  The patient was injured during the seizure  E.  The patient has a temperature over 102 F (39C)  F.  The patient vomited and now is having trouble breathing

## 2014-03-27 NOTE — Progress Notes (Signed)
NEUROLOGY FOLLOW UP OFFICE NOTE  Peter Barr [id number]  HISTORY OF PRESENT ILLNESS: I had the pleasure of seeing Peter Barr in follow-up in the neurology clinic on 03/27/2014.  The patient was last seen 2 months ago for recurrent seizures. His 24-hour EEG had shown slowing and sharp waves over the left temporal region. Lamictal level was 2.0. His Lamictal XR dose was increased to /day. He was reporting dizziness on his last visit, some improvement now that he is back to exercising, but reports the dizziness continues, occurring daily for 20-30 minutes. Since his last visit, he has had one partial seizure on Christmas day. He recalls feeling tired, then was unresponsive to his wife, who noticed his face was droopy, he was drooling, and had a weird taste in his mouth when he came to. He recalls his head was spinning after. He was fine the next day. He denied missing medications, no alcohol intake. He denies any further headaches after starting nortriptyline, but states his back pain has increased. He usually gets the back pain at noon time, then goes away until he gets home. He denies any focal numbness/tingling/weakness, no bowel/bladder dysfunction.   HPI: This is a 29 yo LH man with new onset seizures last 04/16/2013. He came home from a typical 13-hour 3rd shift, came home and recalls his wife fixing breakfast, then has no recollection of events until he was at [facility] ER. His wife reported that he was talking then suddenly tensed up and fell to his right side, with tonic-clonic activity repeatedly hitting his head on the child safety gate in their kitchen for 3-5 minutes. This was followed by sonorous respirations. He was brought to [facility] ER where he had a second GTC after the CT head. He was given an IV load of Cerebryx, which apparently caused burning in the IV site. He was switched from Dilantin and discharged on Vimpat  BID. He had seen neurologist Dr. Terrace Arabia and reported continued to  have back pain and paresthesias in both feet. MRI thoracic spine was unremarkable. He continues on meloxicam and still has the back pain. In March, he was in the ER for dizziness with sudden onset spinning sensation. He was diagnosed with vertigo and discharged home. He also reported fatigue, lack of appetite, poor balance.   He had a nocturnal convulsion in April 2015. In September 2015, he had a complex partial seizure with behavioral arrest, followed by a weird sound, "like a screech," then started moving his body back and forth, hands clenched at his sides. The episode lasted 30 seconds, followed by confusion for 2 minutes. He had no recollection of the episode and denied any warning symptoms. His wife expressed concern that he has been "strange" lately, almost daily, sometimes 1-2 times a day, he would gaze out, then ask her what she had said, lasting a minute or so. He doesn't realize he stopped talking, but she notes he is briefly confused after.   Epilepsy Risk Factors: His father had febrile convulsions as a child. Otherwise he had a normal birth and early development. There is no history of febrile convulsions, CNS infections such as meningitis/encephalitis, significant traumatic brain injury, neurosurgical procedures, or family history of seizures.  Diagnostic Data: I personally reviewed MRI brain with and without contrast which was normal, hippocampi symmetric with no abnormal signal or enhancement seen. Routine EEG reported as normal. 24-hour EEG showed occasional focal left temporal slowing and left temporal epileptiform discharges in sleep. No typical events captured.  PAST MEDICAL HISTORY: Past Medical History  Diagnosis Date  . Hypertension   . Migraine   . Anxiety     MEDICATIONS: Current Outpatient Prescriptions on File Prior to Visit  Medication Sig Dispense Refill  . ibuprofen (ADVIL,MOTRIN) 200 MG tablet Take 400 mg by mouth every 6 (six) hours as needed for headache.      . LamoTRIgine XR 200 MG TB24 Take 1 tablet (200 mg total) by mouth daily. 30 tablet 5  . meloxicam (MOBIC) 15 MG tablet Take 15 mg by mouth daily.    . montelukast (SINGULAIR) 10 MG tablet Take 10 mg by mouth at bedtime.    . nortriptyline (PAMELOR) 10 MG capsule Take 1 capsule (10 mg total) by mouth at bedtime. 30 capsule 6   No current facility-administered medications on file prior to visit.    ALLERGIES: Allergies  Allergen Reactions  . Depakote Er [Divalproex Sodium Er] Other (See Comments)    Caused burns on arm    FAMILY HISTORY: Family History  Problem Relation Age of Onset  . Hypertension Father   . Diabetes Maternal Grandmother   . Stroke Maternal Grandmother     SOCIAL HISTORY: History   Social History  . Marital Status: Married    Spouse Name: [name]    Number of Children: 3  . Years of Education: college   Occupational History  .  [employer]   Social History Main Topics  . Smoking status: Never Smoker   . Smokeless tobacco: Never Used  . Alcohol Use: 0.6 oz/week    1 Cans of beer per week     Comment: OCC  . Drug Use: No  . Sexual Activity:    Partners: Female   Other Topics Concern  . Not on file   Social History Narrative   Patient lives at home with his wife [name]) Patient works Full time out of work on leave.    Education some college   Left handed   Caffeine None    REVIEW OF SYSTEMS: Constitutional: No fevers, chills, or sweats, no generalized fatigue, change in appetite Eyes: No visual changes, double vision, eye pain Ear, nose and throat: No hearing loss, ear pain, nasal congestion, sore throat Cardiovascular: No chest pain, palpitations Respiratory:  No shortness of breath at rest or with exertion, wheezes GastrointestinaI: No nausea, vomiting, diarrhea, abdominal pain, fecal incontinence Genitourinary:  No dysuria, urinary retention or frequency Musculoskeletal:  No neck pain, +back pain Integumentary: No rash,  pruritus, skin lesions Neurological: as above Psychiatric: No depression, insomnia, anxiety Endocrine: No palpitations, fatigue, diaphoresis, mood swings, change in appetite, change in weight, increased thirst Hematologic/Lymphatic:  No anemia, purpura, petechiae. Allergic/Immunologic: no itchy/runny eyes, nasal congestion, recent allergic reactions, rashes  PHYSICAL EXAM: Filed Vitals:   03/27/14 1313  BP: 140/82  Pulse: 88   General: No acute distress Head:  Normocephalic/atraumatic Neck: supple, no paraspinal tenderness, full range of motion Heart:  Regular rate and rhythm Lungs:  Clear to auscultation bilaterally Back: No paraspinal tenderness Skin/Extremities: No rash, no edema Neurological Exam: alert and oriented to person, place, and time. No aphasia or dysarthria. Fund of knowledge is appropriate.  Recent and remote memory are intact.  Attention and concentration are normal.    Able to name objects and repeat phrases. Cranial nerves: Pupils equal, round, reactive to light.  Fundoscopic exam unremarkable, no papilledema. Extraocular movements intact with no nystagmus. Visual fields full. Facial sensation intact. No facial asymmetry. Tongue, uvula, palate midline.  Motor:  Bulk and tone normal, muscle strength 5/5 throughout with no pronator drift.  Sensation to light touch intact.  No extinction to double simultaneous stimulation.  Deep tendon reflexes 2+ throughout, toes downgoing.  Finger to nose testing intact.  Gait narrow-based and steady, able to tandem walk adequately.  Romberg negative.  IMPRESSION: This is a 29 yo LH man with a new onset generalized convulsions last February 2015 when he had 2 convulsions in one day. He has had several seizures since then, 24-hour EEG showed left temporal epileptiform discharges, MRI brain normal. 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 He had one dyscognitive seizure on 03/03/14. He will increase  Lamictal XR to 300mg /day. He will continue nortriptyline 10mg  qhs for headache prevention. We also discussed mood and he endorsed significant difficulties due to his job and home situation. He is interested in seeing Behavioral Health. He is aware of [place] driving laws and he knows not to drive until 6 months seizure-free. He will follow-up in 3 months.  Thank you for allowing me to participate in his care.  Please do not hesitate to call for any questions or concerns.  The duration of this appointment visit was 15 minutes of face-to-face time with the patient.  Greater than 50% of this time was spent in counseling, explanation of diagnosis, planning of further management, and coordination of care.   Patrcia DollyKaren [name], M.D.   CC: Dr. Clovis RileyMitchell

## 2014-04-16 ENCOUNTER — Emergency Department (HOSPITAL_COMMUNITY)
Admission: EM | Admit: 2014-04-16 | Discharge: 2014-04-16 | Disposition: A | Discharge status: Home / Self Care | Payer: Medicaid Other | Attending: Emergency Medicine | Admitting: Emergency Medicine

## 2014-04-16 ENCOUNTER — Emergency Department (HOSPITAL_COMMUNITY): Payer: Medicaid Other

## 2014-04-16 ENCOUNTER — Encounter (HOSPITAL_COMMUNITY): Payer: Self-pay | Admitting: Cardiology

## 2014-04-16 DIAGNOSIS — I10 Essential (primary) hypertension: Secondary | ICD-10-CM | POA: Insufficient documentation

## 2014-04-16 DIAGNOSIS — E669 Obesity, unspecified: Secondary | ICD-10-CM | POA: Diagnosis not present

## 2014-04-16 DIAGNOSIS — R110 Nausea: Secondary | ICD-10-CM | POA: Insufficient documentation

## 2014-04-16 DIAGNOSIS — R0789 Other chest pain: Secondary | ICD-10-CM | POA: Insufficient documentation

## 2014-04-16 DIAGNOSIS — G43909 Migraine, unspecified, not intractable, without status migrainosus: Secondary | ICD-10-CM | POA: Diagnosis not present

## 2014-04-16 DIAGNOSIS — R079 Chest pain, unspecified: Secondary | ICD-10-CM | POA: Diagnosis present

## 2014-04-16 DIAGNOSIS — F419 Anxiety disorder, unspecified: Secondary | ICD-10-CM | POA: Diagnosis not present

## 2014-04-16 DIAGNOSIS — R1013 Epigastric pain: Secondary | ICD-10-CM | POA: Insufficient documentation

## 2014-04-16 DIAGNOSIS — Z79899 Other long term (current) drug therapy: Secondary | ICD-10-CM | POA: Insufficient documentation

## 2014-04-16 LAB — CBC
HCT: 45.7 % (ref 39.0–52.0)
Hemoglobin: 15.6 g/dL (ref 13.0–17.0)
MCH: 27 pg (ref 26.0–34.0)
MCHC: 34.1 g/dL (ref 30.0–36.0)
MCV: 79.2 fL (ref 78.0–100.0)
Platelets: 197 10*3/uL (ref 150–400)
RBC: 5.77 MIL/uL (ref 4.22–5.81)
RDW: 13.5 % (ref 11.5–15.5)
WBC: 7.7 10*3/uL (ref 4.0–10.5)

## 2014-04-16 LAB — BASIC METABOLIC PANEL
Anion gap: 7 (ref 5–15)
BUN: 17 mg/dL (ref 6–23)
CHLORIDE: 105 mmol/L (ref 96–112)
CO2: 29 mmol/L (ref 19–32)
CREATININE: 1.21 mg/dL (ref 0.50–1.35)
Calcium: 9.2 mg/dL (ref 8.4–10.5)
GFR calc Af Amer: >90 mL/min (ref >90)
GFR calc non Af Amer: 81 mL/min — ABNORMAL LOW (ref >90)
Glucose, Bld: 99 mg/dL (ref 70–99)
Potassium: 3.5 mmol/L (ref 3.5–5.1)
Sodium: 141 mmol/L (ref 135–145)

## 2014-04-16 LAB — I-STAT TROPONIN, ED: TROPONIN I, POC: 0.01 ng/mL (ref 0.00–0.08)

## 2014-04-16 MED ORDER — ACETAMINOPHEN 500 MG PO TABS
1000.0000 mg | ORAL_TABLET | Freq: Once | ORAL | Status: AC
  Administered 2014-04-16: 1000 mg via ORAL
  Filled 2014-04-16: qty 2

## 2014-04-16 MED ORDER — PANTOPRAZOLE SODIUM 40 MG PO TBEC
40.0000 mg | DELAYED_RELEASE_TABLET | Freq: Once | ORAL | Status: AC
  Administered 2014-04-16: 40 mg via ORAL
  Filled 2014-04-16: qty 1

## 2014-04-16 MED ORDER — GI COCKTAIL ~~LOC~~
30.0000 mL | Freq: Once | ORAL | Status: AC
  Administered 2014-04-16: 30 mL via ORAL
  Filled 2014-04-16: qty 30

## 2014-04-16 MED ORDER — ONDANSETRON HCL 4 MG PO TABS: 4.0000 mg | ORAL_TABLET | Freq: Three times a day (TID) | ORAL | Status: DC | PRN

## 2014-04-16 MED ORDER — PANTOPRAZOLE SODIUM 40 MG PO TBEC: 40.0000 mg | DELAYED_RELEASE_TABLET | Freq: Every day | ORAL | Status: DC

## 2014-04-16 MED ORDER — ONDANSETRON 4 MG PO TBDP
4.0000 mg | ORAL_TABLET | Freq: Once | ORAL | Status: AC
  Administered 2014-04-16: 4 mg via ORAL
  Filled 2014-04-16: qty 1

## 2014-04-16 NOTE — ED Provider Notes (Signed)
CSN: [id number]     Arrival date & time 04/16/14  1334 History   First MD Initiated Contact with Patient 04/16/14 1349     Chief Complaint  Patient presents with  . Chest Pain     (Consider location/radiation/quality/duration/timing/severity/associated sxs/prior Treatment) HPI Peter Barr is a 29 year old male with past medical history of hypertension, migraine, anxiety who presents the ER complaining of chest discomfort. Patient reports his discomfort began at approximately 1:00 PM this afternoon while riding in a car. Patient describes a generalized, central chest discomfort which she is unable to specify a description. Patient states his pain was constant since then, has slightly eased off. Patient states the discomfort was worse with talking, and had no alleviating factors. Patient reports mild associated nausea without vomiting.  Patient describes shortness of breath, however describes his shortness of breath as that "it hurts my chest to talk". Patient denies dizziness, weakness, syncope, palpitations, fever.  Past Medical History  Diagnosis Date  . Hypertension   . Migraine   . Anxiety    Past Surgical History  Procedure Laterality Date  . Knee surgery Right    Family History  Problem Relation Age of Onset  . Hypertension Father   . Diabetes Maternal Grandmother   . Stroke Maternal Grandmother    History  Substance Use Topics  . Smoking status: Never Smoker   . Smokeless tobacco: Never Used  . Alcohol Use: 0.6 oz/week    1 Cans of beer per week     Comment: OCC    Review of Systems  Constitutional: Negative for fever.  HENT: Negative for trouble swallowing.   Eyes: Negative for visual disturbance.  Respiratory: Negative for shortness of breath.   Cardiovascular: Positive for chest pain.  Gastrointestinal: Positive for nausea. Negative for vomiting and abdominal pain.       Epigastric pain  Genitourinary: Negative for dysuria.  Musculoskeletal: Negative for  neck pain.  Skin: Negative for rash.  Neurological: Negative for dizziness, weakness and numbness.  Psychiatric/Behavioral: Negative.       Allergies  Depakote er  Home Medications   Prior to Admission medications   Medication Sig Start Date End Date Taking? Authorizing Provider  ibuprofen (ADVIL,MOTRIN) 200 MG tablet Take 400 mg by mouth every 6 (six) hours as needed for headache.    Historical Provider, MD  LamoTRIgine 100 MG TB24 Take 3 tablets daily 03/27/14   Van ClinesKaren M [name], MD  meloxicam (MOBIC) 15 MG tablet Take 15 mg by mouth daily. 04/27/13   Historical Provider, MD  montelukast (SINGULAIR) 10 MG tablet Take 10 mg by mouth at bedtime.    Historical Provider, MD  nortriptyline (PAMELOR) 10 MG capsule Take 1 capsule (10 mg total) by mouth at bedtime. 01/25/14   Van ClinesKaren M [name], MD  ondansetron (ZOFRAN) 4 MG tablet Take 1 tablet (4 mg total) by mouth every 8 (eight) hours as needed for nausea or vomiting. 04/16/14   Monte FantasiaJoseph W Oddis Westling, PA-C  pantoprazole (PROTONIX) 40 MG tablet Take 1 tablet (40 mg total) by mouth daily. 04/16/14   Monte FantasiaJoseph W Melea Prezioso, PA-C   BP 132/63 mmHg  Pulse 106  Temp(Src) 98.3 F (36.8 C) (Oral)  Resp 27  Wt 285 lb (129.275 kg)  SpO2 98% Physical Exam  Constitutional: He is oriented to person, place, and time. He appears well-developed and well-nourished. No distress.  Obese male in no obvious distress. Patient chooses to whisper on exam in full, clear sentences.  HENT:  Head: Normocephalic and atraumatic.  Mouth/Throat: Oropharynx is clear and moist. No oropharyngeal exudate.  Eyes: Right eye exhibits no discharge. Left eye exhibits no discharge. No scleral icterus.  Neck: Normal range of motion.  Cardiovascular: Normal rate, regular rhythm, S1 normal, S2 normal and normal heart sounds.   No murmur heard. Pulmonary/Chest: Effort normal and breath sounds normal. No accessory muscle usage. No tachypnea. No respiratory distress.  Abdominal: Soft. There is  tenderness in the epigastric area. There is no rigidity, no guarding, no tenderness at McBurney's point and negative Murphy's sign.  Musculoskeletal: Normal range of motion. He exhibits no edema or tenderness.  Neurological: He is alert and oriented to person, place, and time. No cranial nerve deficit. Coordination normal.  Skin: Skin is warm and dry. No rash noted. He is not diaphoretic.  Psychiatric: He has a normal mood and affect.  Nursing note and vitals reviewed.   ED Course  Procedures (including critical care time) Labs Review Labs Reviewed  BASIC METABOLIC PANEL - Abnormal; Notable for the following:    GFR calc non Af Amer 81 (*)    All other components within normal limits  CBC  I-STAT TROPOININ, ED    Imaging Review Dg Chest 2 View  04/16/2014   CLINICAL DATA:  Chest tightness, shortness of breath  EXAM: CHEST  2 VIEW  COMPARISON:  04/16/2013  FINDINGS: Low lung volumes with mild bibasilar atelectasis. No pleural effusion or pneumothorax.  The heart is normal in size.  Visualized osseous structures are within normal limits.  IMPRESSION: Low lung volumes with mild bibasilar atelectasis.   Electronically Signed   By: Charline Bills M.D.   On: 04/16/2014 16:01     EKG Interpretation None      MDM   Final diagnoses:  Other chest pain   Patient here describing pain in his epigastrium radiating into his midsternal region, patient's pain is reproducible with palpation of the epigastrium. Patient initially choosing to whisper on exam, stating that it hurts his chest worse to talk. He no lung abnormalities noted.   Patient treated with GI cocktail, Protonix and Zofran in the ER, states he feels "much better" after therapy. Patient no longer whispering, stating that it no longer hurts his chest to talk after therapy. Patient is to be discharged with recommendation to follow up with PCP in regards to today's hospital visit. Chest pain is not likely of cardiac or pulmonary  etiology d/t presentation, perc negative, VSS, no tracheal deviation, no JVD or new murmur, RRR, breath sounds equal bilaterally, EKG without acute abnormalities, negative troponin, and negative CXR. Pt has been advised start a PPI and return to the ED is CP becomes exertional, associated with diaphoresis or nausea, radiates to left jaw/arm, worsens or becomes concerning in any way. Pt appears reliable for follow up and is agreeable to discharge. I encouraged patient to call or return to ER should he have any questions or concerns.  BP 132/63 mmHg  Pulse 106  Temp(Src) 98.3 F (36.8 C) (Oral)  Resp 27  Wt 285 lb (129.275 kg)  SpO2 98%  Signed,  Ladona Mow, PA-C 4:44 PM   Case has been discussed with and seen by Dr. Rolland Porter, MD who agrees with the above plan to discharge.       Monte Fantasia, PA-C 04/16/14 1644  Rolland Porter, MD 04/18/14 [phone]

## 2014-04-16 NOTE — ED Notes (Signed)
Pt reports a tightness in his chest and some SOB that started this morning. Denies any n/v.

## 2014-04-16 NOTE — Discharge Instructions (Signed)
Chest Pain (Nonspecific) °It is often hard to give a specific diagnosis for the cause of chest pain. There is always a chance that your pain could be related to something serious, such as a heart attack or a blood clot in the lungs. You need to follow up with your health care provider for further evaluation. °CAUSES  °· Heartburn. °· Pneumonia or bronchitis. °· Anxiety or stress. °· Inflammation around your heart (pericarditis) or lung (pleuritis or pleurisy). °· A blood clot in the lung. °· A collapsed lung (pneumothorax). It can develop suddenly on its own (spontaneous pneumothorax) or from trauma to the chest. °· Shingles infection (herpes zoster virus). °The chest wall is composed of bones, muscles, and cartilage. Any of these can be the source of the pain. °· The bones can be bruised by injury. °· The muscles or cartilage can be strained by coughing or overwork. °· The cartilage can be affected by inflammation and become sore (costochondritis). °DIAGNOSIS  °Lab tests or other studies may be needed to find the cause of your pain. Your health care provider may have you take a test called an ambulatory electrocardiogram (ECG). An ECG records your heartbeat patterns over a 24-hour period. You may also have other tests, such as: °· Transthoracic echocardiogram (TTE). During echocardiography, sound waves are used to evaluate how blood flows through your heart. °· Transesophageal echocardiogram (TEE). °· Cardiac monitoring. This allows your health care provider to monitor your heart rate and rhythm in real time. °· Holter monitor. This is a portable device that records your heartbeat and can help diagnose heart arrhythmias. It allows your health care provider to track your heart activity for several days, if needed. °· Stress tests by exercise or by giving medicine that makes the heart beat faster. °TREATMENT  °· Treatment depends on what may be causing your chest pain. Treatment may include: °· Acid blockers for  heartburn. °· Anti-inflammatory medicine. °· Pain medicine for inflammatory conditions. °· Antibiotics if an infection is present. °· You may be advised to change lifestyle habits. This includes stopping smoking and avoiding alcohol, caffeine, and chocolate. °· You may be advised to keep your head raised (elevated) when sleeping. This reduces the chance of acid going backward from your stomach into your esophagus. °Most of the time, nonspecific chest pain will improve within 2-3 days with rest and mild pain medicine.  °HOME CARE INSTRUCTIONS  °· If antibiotics were prescribed, take them as directed. Finish them even if you start to feel better. °· For the next few days, avoid physical activities that bring on chest pain. Continue physical activities as directed. °· Do not use any tobacco products, including cigarettes, chewing tobacco, or electronic cigarettes. °· Avoid drinking alcohol. °· Only take medicine as directed by your health care provider. °· Follow your health care provider's suggestions for further testing if your chest pain does not go away. °· Keep any follow-up appointments you made. If you do not go to an appointment, you could develop lasting (chronic) problems with pain. If there is any problem keeping an appointment, call to reschedule. °SEEK MEDICAL CARE IF:  °· Your chest pain does not go away, even after treatment. °· You have a rash with blisters on your chest. °· You have a fever. °SEEK IMMEDIATE MEDICAL CARE IF:  °· You have increased chest pain or pain that spreads to your arm, neck, jaw, back, or abdomen. °· You have shortness of breath. °· You have an increasing cough, or you cough   up blood. °· You have severe back or abdominal pain. °· You feel nauseous or vomit. °· You have severe weakness. °· You faint. °· You have chills. °This is an emergency. Do not wait to see if the pain will go away. Get medical help at once. Call your local emergency services (911 in [place] Do not drive  yourself to the hospital. °MAKE SURE YOU:  °· Understand these instructions. °· Will watch your condition. °· Will get help right away if you are not doing well or get worse. °Document Released: 12/04/2004 Document Revised: 03/01/2013 Document Reviewed: 09/30/2007 °ExitCare® Patient Information ©2015 [employer]. This information is not intended to replace advice given to you by your health care provider. Make sure you discuss any questions you have with your health care provider. °Gastroesophageal Reflux Disease, Adult °Gastroesophageal reflux disease (GERD) happens when acid from your stomach flows up into the esophagus. When acid comes in contact with the esophagus, the acid causes soreness (inflammation) in the esophagus. Over time, GERD may create small holes (ulcers) in the lining of the esophagus. °CAUSES  °· Increased body weight. This puts pressure on the stomach, making acid rise from the stomach into the esophagus. °· Smoking. This increases acid production in the stomach. °· Drinking alcohol. This causes decreased pressure in the lower esophageal sphincter (valve or ring of muscle between the esophagus and stomach), allowing acid from the stomach into the esophagus. °· Late evening meals and a full stomach. This increases pressure and acid production in the stomach. °· A malformed lower esophageal sphincter. °Sometimes, no cause is found. °SYMPTOMS  °· Burning pain in the lower part of the mid-chest behind the breastbone and in the mid-stomach area. This may occur twice a week or more often. °· Trouble swallowing. °· Sore throat. °· Dry cough. °· Asthma-like symptoms including chest tightness, shortness of breath, or wheezing. °DIAGNOSIS  °Your caregiver may be able to diagnose GERD based on your symptoms. In some cases, X-rays and other tests may be done to check for complications or to check the condition of your stomach and esophagus. °TREATMENT  °Your caregiver may recommend over-the-counter or  prescription medicines to help decrease acid production. Ask your caregiver before starting or adding any new medicines.  °HOME CARE INSTRUCTIONS  °· Change the factors that you can control. Ask your caregiver for guidance concerning weight loss, quitting smoking, and alcohol consumption. °· Avoid foods and drinks that make your symptoms worse, such as: °¨ Caffeine or alcoholic drinks. °¨ Chocolate. °¨ Peppermint or mint flavorings. °¨ Garlic and onions. °¨ Spicy foods. °¨ Citrus fruits, such as oranges, lemons, or limes. °¨ Tomato-based foods such as sauce, chili, salsa, and pizza. °¨ Fried and fatty foods. °· Avoid lying down for the 3 hours prior to your bedtime or prior to taking a nap. °· Eat small, frequent meals instead of large meals. °· Wear loose-fitting clothing. Do not wear anything tight around your waist that causes pressure on your stomach. °· Raise the head of your bed 6 to 8 inches with wood blocks to help you sleep. Extra pillows will not help. °· Only take over-the-counter or prescription medicines for pain, discomfort, or fever as directed by your caregiver. °· Do not take aspirin, ibuprofen, or other nonsteroidal anti-inflammatory drugs (NSAIDs). °SEEK IMMEDIATE MEDICAL CARE IF:  °· You have pain in your arms, neck, jaw, teeth, or back. °· Your pain increases or changes in intensity or duration. °· You develop nausea, vomiting, or sweating (diaphoresis). °·   diaphoresis).  You develop shortness of breath, or you faint.  Your vomit is green, yellow, black, or looks like coffee grounds or blood.  Your stool is red, bloody, or black. These symptoms could be signs of other problems, such as heart disease, gastric bleeding, or esophageal bleeding. MAKE SURE YOU:   Understand these instructions.  Will watch your condition.  Will get help right away if you are not doing well or get worse. Document Released: 12/04/2004 Document Revised: 05/19/2011 Document Reviewed: 09/13/2010 [facility] Patient  Information 2015 [place], [place]. This information is not intended to replace advice given to you by your health care provider. Make sure you discuss any questions you have with your health care provider.  Gastritis, Adult Gastritis is soreness and swelling (inflammation) of the lining of the stomach. Gastritis can develop as a sudden onset (acute) or long-term (chronic) condition. If gastritis is not treated, it can lead to stomach bleeding and ulcers. CAUSES  Gastritis occurs when the stomach lining is weak or damaged. Digestive juices from the stomach then inflame the weakened stomach lining. The stomach lining may be weak or damaged due to viral or bacterial infections. One common bacterial infection is the Helicobacter pylori infection. Gastritis can also result from excessive alcohol consumption, taking certain medicines, or having too much acid in the stomach.  SYMPTOMS  In some cases, there are no symptoms. When symptoms are present, they may include:  Pain or a burning sensation in the upper abdomen.  Nausea.  Vomiting.  An uncomfortable feeling of fullness after eating. DIAGNOSIS  Your caregiver may suspect you have gastritis based on your symptoms and a physical exam. To determine the cause of your gastritis, your caregiver may perform the following:  Blood or stool tests to check for the H pylori bacterium.  Gastroscopy. A thin, flexible tube (endoscope) is passed down the esophagus and into the stomach. The endoscope has a light and camera on the end. Your caregiver uses the endoscope to view the inside of the stomach.  Taking a tissue sample (biopsy) from the stomach to examine under a microscope. TREATMENT  Depending on the cause of your gastritis, medicines may be prescribed. If you have a bacterial infection, such as an H pylori infection, antibiotics may be given. If your gastritis is caused by too much acid in the stomach, H2 blockers or antacids may be given. Your  caregiver may recommend that you stop taking aspirin, ibuprofen, or other nonsteroidal anti-inflammatory drugs (NSAIDs). HOME CARE INSTRUCTIONS  Only take over-the-counter or prescription medicines as directed by your caregiver.  If you were given antibiotic medicines, take them as directed. Finish them even if you start to feel better.  Drink enough fluids to keep your urine clear or pale yellow.  Avoid foods and drinks that make your symptoms worse, such as:  Caffeine or alcoholic drinks.  Chocolate.  Peppermint or mint flavorings.  Garlic and onions.  Spicy foods.  Citrus fruits, such as oranges, lemons, or limes.  Tomato-based foods such as sauce, chili, salsa, and pizza.  Fried and fatty foods.  Eat small, frequent meals instead of large meals. SEEK IMMEDIATE MEDICAL CARE IF:   You have black or dark red stools.  You vomit blood or material that looks like coffee grounds.  You are unable to keep fluids down.  Your abdominal pain gets worse.  You have a fever.  You do not feel better after 1 week.  You have any other questions or concerns. MAKE SURE YOU:  Understand these instructions.  Will watch your condition.  Will get help right away if you are not doing well or get worse. Document Released: 02/18/2001 Document Revised: 08/26/2011 Document Reviewed: 04/09/2011 [facility] Patient Information 2015 [place], [place]. This information is not intended to replace advice given to you by your health care provider. Make sure you discuss any questions you have with your health care provider.  Food Choices for Gastroesophageal Reflux Disease When you have gastroesophageal reflux disease (GERD), the foods you eat and your eating habits are very important. Choosing the right foods can help ease the discomfort of GERD. WHAT GENERAL GUIDELINES DO I NEED TO FOLLOW?  Choose fruits, vegetables, whole grains, low-fat dairy products, and low-fat meat, fish, and  poultry.  Limit fats such as oils, salad dressings, butter, nuts, and avocado.  Keep a food diary to identify foods that cause symptoms.  Avoid foods that cause reflux. These may be different for different people.  Eat frequent small meals instead of three large meals each day.  Eat your meals slowly, in a relaxed setting.  Limit fried foods.  Cook foods using methods other than frying.  Avoid drinking alcohol.  Avoid drinking large amounts of liquids with your meals.  Avoid bending over or lying down until 2-3 hours after eating. WHAT FOODS ARE NOT RECOMMENDED? The following are some foods and drinks that may worsen your symptoms: Vegetables Tomatoes. Tomato juice. Tomato and spaghetti sauce. Chili peppers. Onion and garlic. Horseradish. Fruits Oranges, grapefruit, and lemon (fruit and juice). Meats High-fat meats, fish, and poultry. This includes hot dogs, ribs, ham, sausage, salami, and bacon. Dairy Whole milk and chocolate milk. Sour cream. Cream. Butter. Ice cream. Cream cheese.  Beverages Coffee and tea, with or without caffeine. Carbonated beverages or energy drinks. Condiments Hot sauce. Barbecue sauce.  Sweets/Desserts Chocolate and cocoa. Donuts. Peppermint and spearmint. Fats and Oils High-fat foods, including [place] fries and potato chips. Other Vinegar. Strong spices, such as black pepper, white pepper, red pepper, cayenne, curry powder, cloves, ginger, and chili powder. The items listed above may not be a complete list of foods and beverages to avoid. Contact your dietitian for more information. Document Released: 02/24/2005 Document Revised: 03/01/2013 Document Reviewed: 12/29/2012 [facility] Patient Information 2015 [place], [place]. This information is not intended to replace advice given to you by your health care provider. Make sure you discuss any questions you have with your health care provider.

## 2014-04-16 NOTE — ED Notes (Signed)
Lab at the bedside 

## 2014-05-02 ENCOUNTER — Telehealth: Payer: Self-pay | Admitting: Family Medicine

## 2014-05-02 NOTE — Telephone Encounter (Signed)
Patient returned my call. Told him that forms we received from [employer] were ready to be faxed, I did explain that before we could fax forms he would have to pay the $25.00 form fee. I did explain that he didn't have to come in to the office to pay that we could take the payment over the phone. He is going to call back tomorrow to pay fee. Once fee is paid I told him I would fax the forms.

## 2014-05-02 NOTE — Telephone Encounter (Signed)
Left msg on voicemail to return my call. Need to discuss disability forms.

## 2014-05-03 DIAGNOSIS — Z0279 Encounter for issue of other medical certificate: Secondary | ICD-10-CM

## 2014-06-06 ENCOUNTER — Telehealth: Payer: Self-pay | Admitting: Family Medicine

## 2014-06-06 MED ORDER — LAMOTRIGINE 150 MG PO TABS
150.0000 mg | ORAL_TABLET | Freq: Two times a day (BID) | ORAL | Status: DC
Start: 2014-06-06 — End: 2014-07-25

## 2014-06-06 NOTE — Telephone Encounter (Signed)
Lmovm to return my call. 

## 2014-06-06 NOTE — Telephone Encounter (Signed)
I called patient to let him know htat his insurance is no longer going to cover Lamotrigine XR 100 mg. Dr. Karel JarvisAquino wants him to switch to Lamotrigine IR 150 mg bid. New Rx was sent to his pharmacy. I told patient that I would give him a call if we get a call or fax from his pharmacy stating that new Rx isn't going to covered either.

## 2014-06-06 NOTE — Telephone Encounter (Signed)
Please let him know about Medicaid constraints on us. Would start new medication that reportedly is covered by Medicaid, pls give him samples of Aptiom 400mg  take 1 tablet daily for 1 week, then Aptiom 600mg  take 1 tablet daily for 1 week, then Aptiom 800mg  take 1 tablet daily and continue. We will send an Rx for Aptiom 800mg  once a day if he tolerates this fine. Side effects may be dizziness, blurred vision when he first starts, these usually get better once body gets used to medication. Thanks

## 2014-06-06 NOTE — Telephone Encounter (Signed)
Pharmacy called. Medicaid is not going to cover the immediate release of Lamotrigine. Please advise.

## 2014-06-06 NOTE — Telephone Encounter (Signed)
Patient returned my call. I did explain to him that his insurance wouldn't cover the Lamotrigine IR either. He is willing to try the new medication. I did give him instructions as stated below as well as possible side effects. I did tell him when he is about a week & a half in to the 800 mg tabs to give us a call with an update and if he's doing well on med that I will send in a Rx to his pharmacy. I did tell him to call us sooner if he experiences any side effects that I didn;t mention or if he gets any side effects that don't subside. Samples left up front for p/u.

## 2014-06-26 ENCOUNTER — Ambulatory Visit: Payer: Medicaid Other | Admitting: Neurology

## 2014-06-27 ENCOUNTER — Encounter: Payer: Self-pay | Admitting: Neurology

## 2014-07-04 ENCOUNTER — Telehealth: Payer: Self-pay | Admitting: *Deleted

## 2014-07-04 MED ORDER — ESLICARBAZEPINE ACETATE 800 MG PO TABS: 800.0000 mg | ORAL_TABLET | Freq: Every day | ORAL | Status: DC

## 2014-07-04 NOTE — Telephone Encounter (Signed)
Please advise 

## 2014-07-04 NOTE — Telephone Encounter (Signed)
States since starting med(Aptiom 800mg ) he's having a hard time sleeping, he's also having a hard time concentrating. States his headaches have increased. He still has nortriptyline 10 mg, wants to know if he can start back taking that.

## 2014-07-04 NOTE — Telephone Encounter (Signed)
Lmovm to return my call. 

## 2014-07-04 NOTE — Telephone Encounter (Signed)
I spoke with patient again and advised per Dr. Karel JarvisAquino to restart the nortriptyline & to continue with the Aptiom 800 mg daily for now. New Rx sent to his pharmacy & 7 day supply samples left up front for p/u.

## 2014-07-04 NOTE — Telephone Encounter (Signed)
Can restart nortriptyline 10mg  at bedtime to help with sleep.Thanks

## 2014-07-04 NOTE — Telephone Encounter (Signed)
Pls send Rx for Aptiom 800mg  once a day, #30 with 3 refills. Can give a week of samples until he gets medication, thanks

## 2014-07-04 NOTE — Telephone Encounter (Signed)
Patient called requesting a RX for Aptiom or what do you want him to do next he will be out of this med on tomarrow Call back number [phone]

## 2014-07-12 ENCOUNTER — Telehealth: Payer: Self-pay | Admitting: *Deleted

## 2014-07-12 NOTE — Telephone Encounter (Signed)
I spoke with patient. He states that Aptiom Rx isn't covered by his ins & he was told by pharmacy out of pocket is $900. I told him that I will call Medicaid because Aptiom is supposed to be covered by his ins.  I called [employer] & spoke with representative and she stated that medication is covered by Medicaid, but because patient has Family Planning Medicaid it's not going to cover medications, she suggested that I have patient call his caseworker to find out what exactly is covered and if he's capable of getting any medications.  I called patient and explained above. He is going to call his caseworker to find out what his options are. Will give him some more samples of the Aptiom until he can try & get this straightened out with Medicaid.  Samples left up front for p/u.

## 2014-07-12 NOTE — Telephone Encounter (Signed)
Patient call and states his insurance has denid his medication i think he was trying to say nortriptyline ???? He said opamlor Call back number [phone]

## 2014-07-14 ENCOUNTER — Telehealth: Payer: Self-pay | Admitting: Neurology

## 2014-07-14 NOTE — Telephone Encounter (Signed)
Pt came in and got the sample of medication but has something about medcation approval to talk to you about please cal [phone]

## 2014-07-14 NOTE — Telephone Encounter (Signed)
Lmovm to return my call. 

## 2014-07-18 NOTE — Telephone Encounter (Signed)
I called patient again, I did speak with him. He states he is still waiting to hear back from his Medicaid case worker about hie plan. He wanted to know if there was anything in particular that he needed to ask her about medications. I advised that he find out for sure if the type of medicaid he does have doesn't have any Rx coverage & if not try to find out he is eligible for the type of medicaid that does. I advised to call us back when he does talk to his case worker so we can know how to proceed.

## 2014-07-25 ENCOUNTER — Telehealth: Payer: Self-pay | Admitting: *Deleted

## 2014-07-25 ENCOUNTER — Emergency Department (HOSPITAL_COMMUNITY)
Admission: EM | Admit: 2014-07-25 | Discharge: 2014-07-25 | Disposition: A | Discharge status: Home / Self Care | Payer: Medicaid Other | Attending: Emergency Medicine | Admitting: Emergency Medicine

## 2014-07-25 ENCOUNTER — Encounter (HOSPITAL_COMMUNITY): Payer: Self-pay | Admitting: Emergency Medicine

## 2014-07-25 ENCOUNTER — Emergency Department (HOSPITAL_COMMUNITY): Payer: Medicaid Other

## 2014-07-25 ENCOUNTER — Ambulatory Visit (INDEPENDENT_AMBULATORY_CARE_PROVIDER_SITE_OTHER): Payer: Self-pay | Admitting: Neurology

## 2014-07-25 ENCOUNTER — Encounter: Payer: Self-pay | Admitting: Neurology

## 2014-07-25 VITALS — BP 136/88 | HR 84 | Resp 16 | Ht 71.0 in | Wt 288.0 lb

## 2014-07-25 DIAGNOSIS — G43809 Other migraine, not intractable, without status migrainosus: Secondary | ICD-10-CM | POA: Insufficient documentation

## 2014-07-25 DIAGNOSIS — G40009 Localization-related (focal) (partial) idiopathic epilepsy and epileptic syndromes with seizures of localized onset, not intractable, without status epilepticus: Secondary | ICD-10-CM

## 2014-07-25 DIAGNOSIS — I10 Essential (primary) hypertension: Secondary | ICD-10-CM | POA: Insufficient documentation

## 2014-07-25 DIAGNOSIS — Z79899 Other long term (current) drug therapy: Secondary | ICD-10-CM | POA: Insufficient documentation

## 2014-07-25 DIAGNOSIS — Z8659 Personal history of other mental and behavioral disorders: Secondary | ICD-10-CM | POA: Insufficient documentation

## 2014-07-25 DIAGNOSIS — Z791 Long term (current) use of non-steroidal anti-inflammatories (NSAID): Secondary | ICD-10-CM | POA: Insufficient documentation

## 2014-07-25 DIAGNOSIS — G40909 Epilepsy, unspecified, not intractable, without status epilepticus: Secondary | ICD-10-CM | POA: Insufficient documentation

## 2014-07-25 HISTORY — DX: Unspecified convulsions: R56.9

## 2014-07-25 LAB — DIFFERENTIAL
Basophils Absolute: 0 10*3/uL (ref 0.0–0.1)
Basophils Relative: 0 % (ref 0–1)
Eosinophils Absolute: 0.1 10*3/uL (ref 0.0–0.7)
Eosinophils Relative: 1 % (ref 0–5)
LYMPHS ABS: 1.9 10*3/uL (ref 0.7–4.0)
LYMPHS PCT: 23 % (ref 12–46)
MONO ABS: 1 10*3/uL (ref 0.1–1.0)
Monocytes Relative: 11 % (ref 3–12)
Neutro Abs: 5.5 10*3/uL (ref 1.7–7.7)
Neutrophils Relative %: 65 % (ref 43–77)

## 2014-07-25 LAB — COMPREHENSIVE METABOLIC PANEL
ALT: 36 U/L (ref 17–63)
AST: 30 U/L (ref 15–41)
Albumin: 3.7 g/dL (ref 3.5–5.0)
Alkaline Phosphatase: 83 U/L (ref 38–126)
Anion gap: 9 (ref 5–15)
BUN: 10 mg/dL (ref 6–20)
CO2: 25 mmol/L (ref 22–32)
Calcium: 9.4 mg/dL (ref 8.9–10.3)
Chloride: 107 mmol/L (ref 101–111)
Creatinine, Ser: 1.16 mg/dL (ref 0.61–1.24)
GFR calc Af Amer: >60 mL/min (ref >60)
GFR calc non Af Amer: >60 mL/min (ref >60)
Glucose, Bld: 107 mg/dL — ABNORMAL HIGH (ref 65–99)
Potassium: 3.7 mmol/L (ref 3.5–5.1)
SODIUM: 141 mmol/L (ref 135–145)
Total Bilirubin: 0.4 mg/dL (ref 0.3–1.2)
Total Protein: 7 g/dL (ref 6.5–8.1)

## 2014-07-25 LAB — RAPID URINE DRUG SCREEN, HOSP PERFORMED
Amphetamines: NOT DETECTED
Barbiturates: NOT DETECTED
Benzodiazepines: NOT DETECTED
COCAINE: NOT DETECTED
Opiates: NOT DETECTED
TETRAHYDROCANNABINOL: NOT DETECTED

## 2014-07-25 LAB — URINALYSIS, ROUTINE W REFLEX MICROSCOPIC
Bilirubin Urine: NEGATIVE
GLUCOSE, UA: NEGATIVE mg/dL
HGB URINE DIPSTICK: NEGATIVE
Ketones, ur: NEGATIVE mg/dL
LEUKOCYTES UA: NEGATIVE
Nitrite: NEGATIVE
PH: 7.5 (ref 5.0–8.0)
Protein, ur: NEGATIVE mg/dL
Specific Gravity, Urine: 1.015 (ref 1.005–1.030)
Urobilinogen, UA: 1 mg/dL (ref 0.0–1.0)

## 2014-07-25 LAB — CBC
HCT: 45.2 % (ref 39.0–52.0)
HEMOGLOBIN: 15.4 g/dL (ref 13.0–17.0)
MCH: 27 pg (ref 26.0–34.0)
MCHC: 34.1 g/dL (ref 30.0–36.0)
MCV: 79.3 fL (ref 78.0–100.0)
Platelets: 198 10*3/uL (ref 150–400)
RBC: 5.7 MIL/uL (ref 4.22–5.81)
RDW: 13.6 % (ref 11.5–15.5)
WBC: 8.6 10*3/uL (ref 4.0–10.5)

## 2014-07-25 LAB — I-STAT CHEM 8, ED
BUN: 12 mg/dL (ref 6–20)
CHLORIDE: 106 mmol/L (ref 101–111)
Calcium, Ion: 1.19 mmol/L (ref 1.12–1.23)
Creatinine, Ser: 1.1 mg/dL (ref 0.61–1.24)
Glucose, Bld: 107 mg/dL — ABNORMAL HIGH (ref 65–99)
HEMATOCRIT: 50 % (ref 39.0–52.0)
Hemoglobin: 17 g/dL (ref 13.0–17.0)
POTASSIUM: 3.8 mmol/L (ref 3.5–5.1)
SODIUM: 142 mmol/L (ref 135–145)
TCO2: 20 mmol/L (ref 0–100)

## 2014-07-25 LAB — PROTIME-INR
INR: 1.08 (ref 0.00–1.49)
PROTHROMBIN TIME: 14.1 s (ref 11.6–15.2)

## 2014-07-25 LAB — ETHANOL: Alcohol, Ethyl (B): <5 mg/dL (ref <5)

## 2014-07-25 MED ORDER — METOCLOPRAMIDE HCL 5 MG/ML IJ SOLN
10.0000 mg | Freq: Once | INTRAMUSCULAR | Status: AC
  Administered 2014-07-25: 10 mg via INTRAVENOUS
  Filled 2014-07-25: qty 2

## 2014-07-25 MED ORDER — DIPHENHYDRAMINE HCL 50 MG/ML IJ SOLN
12.5000 mg | Freq: Once | INTRAMUSCULAR | Status: AC
  Administered 2014-07-25: 12.5 mg via INTRAVENOUS
  Filled 2014-07-25: qty 1

## 2014-07-25 MED ORDER — SODIUM CHLORIDE 0.9 % IV BOLUS (SEPSIS)
1000.0000 mL | Freq: Once | INTRAVENOUS | Status: AC
  Administered 2014-07-25: 1000 mL via INTRAVENOUS

## 2014-07-25 NOTE — Telephone Encounter (Signed)
Patient called after hour answering service he thinks he had a seizure last night and is still very shaky this morning please advise  Call back number [phone]

## 2014-07-25 NOTE — ED Notes (Signed)
Per EMS, pt sent here from his neurologist for generalized weakness and headache. Pts neurologist sent him bc he wasn't acting like himself. Pt did report seizure last night. Pt alert x4. NAD at this time. Pt ambulated from the EMS stretcher to the bed.

## 2014-07-25 NOTE — ED Provider Notes (Signed)
CSN: [id number]     Arrival date & time 07/25/14  1039 History   First MD Initiated Contact with Patient 07/25/14 1055     Chief Complaint  Patient presents with  . Fatigue  . Headache    HPI Pt started having trouble with nausea and headache two days ago.  He tried taking zofran and ibuprofen.  The sx were worse this am when he woke up.  He felt fatigued.  He saw his neurologist today.  Pt feels like he had a seizure last night.  He is not sure how long it lasted but when he woke up he had a bad headache and he felt like he was having trouble walking.  He was able to walk to his doctors office today and could drive himself there.  No fevers, he does have neck and back pain.  No vomiting or diarrhea.  Pt was evaluated by his neurologist who was worried about possible infection and was sent to the ED. Past Medical History  Diagnosis Date  . Hypertension   . Migraine   . Anxiety   . Seizures    Past Surgical History  Procedure Laterality Date  . Knee surgery Right    Family History  Problem Relation Age of Onset  . Hypertension Father   . Diabetes Maternal Grandmother   . Stroke Maternal Grandmother    History  Substance Use Topics  . Smoking status: Never Smoker   . Smokeless tobacco: Never Used  . Alcohol Use: 0.6 oz/week    1 Cans of beer per week     Comment: OCC    Review of Systems  All other systems reviewed and are negative.     Allergies  Depakote er  Home Medications   Prior to Admission medications   Medication Sig Start Date End Date Taking? Authorizing Provider  Eslicarbazepine Acetate 800 MG TABS Take 800 mg by mouth daily. 07/04/14  Yes Van ClinesKaren M [name], MD  ibuprofen (ADVIL,MOTRIN) 200 MG tablet Take 400 mg by mouth every 6 (six) hours as needed for headache.   Yes Historical Provider, MD  meloxicam (MOBIC) 15 MG tablet Take 15 mg by mouth daily.   Yes Historical Provider, MD  montelukast (SINGULAIR) 10 MG tablet Take 10 mg by mouth at bedtime.   Yes  Historical Provider, MD  Multiple Vitamins-Minerals (MULTIVITAMIN WITH MINERALS) tablet Take 1 tablet by mouth daily.   Yes Historical Provider, MD  nortriptyline (PAMELOR) 10 MG capsule Take 1 capsule (10 mg total) by mouth at bedtime. 01/25/14  Yes Van ClinesKaren M [name], MD  pantoprazole (PROTONIX) 40 MG tablet Take 1 tablet (40 mg total) by mouth daily. 04/16/14  Yes Ladona MowJoe Mintz, PA-C  ondansetron (ZOFRAN) 4 MG tablet Take 1 tablet (4 mg total) by mouth every 8 (eight) hours as needed for nausea or vomiting. Patient not taking: Reported on 07/25/2014 04/16/14   Ladona MowJoe Mintz, PA-C   BP 122/82 mmHg  Pulse 81  Temp(Src) 98.8 F (37.1 C) (Oral)  Resp 18  SpO2 98% Physical Exam  Constitutional: He is oriented to person, place, and time. He appears well-developed and well-nourished. No distress.  HENT:  Head: Normocephalic and atraumatic.  Right Ear: External ear normal.  Left Ear: External ear normal.  Eyes: Conjunctivae are normal. Right eye exhibits no discharge. Left eye exhibits no discharge. No scleral icterus.  Neck: Neck supple. No tracheal deviation present.  Neck is supple no meningismus  Cardiovascular: Normal rate, regular rhythm and intact distal pulses.  Pulmonary/Chest: Effort normal and breath sounds normal. No stridor. No respiratory distress. He has no wheezes. He has no rales.  Abdominal: Soft. Bowel sounds are normal. He exhibits no distension. There is no tenderness. There is no rebound and no guarding.  Musculoskeletal: He exhibits no edema or tenderness.  Neurological: He is alert and oriented to person, place, and time. He has normal strength. No cranial nerve deficit (no facial droop, extraocular movements intact, no slurred speech) or sensory deficit. He exhibits normal muscle tone. He displays no seizure activity. Coordination normal.  Skin: Skin is warm and dry. No rash noted.  Psychiatric: He has a normal mood and affect.  Nursing note and vitals reviewed.   ED Course   Procedures (including critical care time) Labs Review Labs Reviewed  COMPREHENSIVE METABOLIC PANEL - Abnormal; Notable for the following:    Glucose, Bld 107 (*)    All other components within normal limits  I-STAT CHEM 8, ED - Abnormal; Notable for the following:    Glucose, Bld 107 (*)    All other components within normal limits  PROTIME-INR  CBC  DIFFERENTIAL  URINE RAPID DRUG SCREEN (HOSP PERFORMED)  ETHANOL  URINALYSIS, ROUTINE W REFLEX MICROSCOPIC    Imaging Review Ct Head Wo Contrast  07/25/2014   CLINICAL DATA:  Headache, fatigue  EXAM: CT HEAD WITHOUT CONTRAST  TECHNIQUE: Contiguous axial images were obtained from the base of the skull through the vertex without intravenous contrast.  COMPARISON:  12/10/2013  FINDINGS: There is no evidence of mass effect, midline shift or extra-axial fluid collections. There is no evidence of a space-occupying lesion or intracranial hemorrhage. There is no evidence of a cortical-based area of acute infarction.  The ventricles and sulci are appropriate for the patient's age. The basal cisterns are patent.  Visualized portions of the orbits are unremarkable. Mild right maxillary sinus and ethmoid sinus mucosal thickening.  The osseous structures are unremarkable.  IMPRESSION: Normal CT of the brain without intravenous contrast.   Electronically Signed   By: Elige KoHetal  Patel   On: 07/25/2014 12:12     EKG Interpretation   Date/Time:  Tuesday Jul 25 2014 10:41:34 EDT Ventricular Rate:  74 PR Interval:  149 QRS Duration: 92 QT Interval:  375 QTC Calculation: 416 R Axis:   56 Text Interpretation:  Sinus rhythm Baseline wander in lead(s) II aVF No  significant change since last tracing Confirmed by Taiwan Talcott  MD-J, Falisa Lamora  ([id number]) on 07/25/2014 10:52:04 AM     Medications  sodium chloride 0.9 % bolus 1,000 mL (1,000 mLs Intravenous New Bag/Given 07/25/14 1128)  metoCLOPramide (REGLAN) injection 10 mg (10 mg Intravenous Given 07/25/14 1128)   diphenhydrAMINE (BENADRYL) injection 12.5 mg (12.5 mg Intravenous Given 07/25/14 1128)    MDM   Final diagnoses:  Other migraine without status migrainosus, not intractable    Pt's symptoms improved with migraine cocktail.  Suspect he may have had  A seizure and home and was postical in the office.  Pt is not confused here.  He has no neck stiffness, fever, or abdnormalities in his labs and CT.  Doubt meningitis.  I do not feel that LP is necessary at this time.  Discussed case with Dr Karel JarvisAquino to make her aware of the workup today in the ED.  Pt and spouse are comfortable going home at this point.  No concerns.    Linwood DibblesJon Leily Capek, MD 07/25/14 [phone]

## 2014-07-25 NOTE — Telephone Encounter (Signed)
Patient walked-in the office this morning & was worked in to see Dr. Karel JarvisAquino.

## 2014-07-25 NOTE — Progress Notes (Signed)
NEUROLOGY FOLLOW UP OFFICE NOTE  Peter Maskerhillip Newhard [id number]  HISTORY OF PRESENT ILLNESS: I had the pleasure of seeing Peter Barr in follow-up in the neurology clinic on 07/25/2014.  The patient was last seen 4 months ago for recurrent seizures. He was not scheduled for a visit but came to the office today reporting that he had a seizure last night and was not feeling well. He was seen as an urgent visit, he is noted to be confused, he is able to answer orientation questions, but is noted to ask the same questions, stating he feels shaky and asking what is going on with him. He states he even forgot his wallet. He DROVE to the office today, and knows he should not be driving if he had a seizure. He reports having a headache for the past 2 days, with associated nausea and gagging. He took leftover nausea medication and Tylenol, but felt even worse yesterday with his head pounding. He told his wife this morning to take him to the hospital but she had to bring her son to school and "closed the door." He decided to drive himself to our office instead. He states he feels he had a seizure last night in his sleep, he woke up this morning feeling very weak. He denies any alcohol intake. He has been having green mucus.  He has been having insurance issues with obtaining any medication. He had previously been taking Lamictal XR 200mg /day. Our office got a call from his pharmacy that Medicaid is not covering Lamotrigine, he was given samples for Aptiom, and is currently on 800mg /day.   HPI: This is a 29 yo LH man with new onset seizures last 04/16/2013. He came home from a typical 13-hour 3rd shift, came home and recalls his wife fixing breakfast, then has no recollection of events until he was at [facility]. His wife reported that he was talking then suddenly tensed up and fell to his right side, with tonic-clonic activity repeatedly hitting his head on the child safety gate in their kitchen for 3-5 minutes. This  was followed by sonorous respirations. He was brought to [facility] ER where he had a second GTC after the CT head. He was given an IV load of Cerebryx, which apparently caused burning in the IV site. He was switched from Dilantin and discharged on Vimpat 100mg  BID. He had seen neurologist Dr. Terrace ArabiaYan and reported continued to have back pain and paresthesias in both feet. MRI thoracic spine was unremarkable. He continues on meloxicam and still has the back pain. In March, he was in the ER for dizziness with sudden onset spinning sensation. He was diagnosed with vertigo and discharged home. He also reported fatigue, lack of appetite, poor balance.   He had a nocturnal convulsion in April 2015. In September 2015, he had a complex partial seizure with behavioral arrest, followed by a weird sound, "like a screech," then started moving his body back and forth, hands clenched at his sides. The episode lasted 30 seconds, followed by confusion for 2 minutes. He had no recollection of the episode and denied any warning symptoms. His wife expressed concern that he has been "strange" lately, almost daily, sometimes 1-2 times a day, he would gaze out, then ask her what she had said, lasting a minute or so. He doesn't realize he stopped talking, but she notes he is briefly confused after. He had a complex partial seizure on Christmas day 2015. He recalls feeling tired, then was unresponsive to  his wife, who noticed his face was droopy, he was drooling, and had a weird taste in his mouth when he came to. He recalls his head was spinning after. He was fine the next day.   Epilepsy Risk Factors: His father had febrile convulsions as a child. Otherwise he had a normal birth and early development. There is no history of febrile convulsions, CNS infections such as meningitis/encephalitis, significant traumatic brain injury, neurosurgical procedures, or family history of seizures.  Diagnostic Data: I personally reviewed MRI brain with  and without contrast which was normal, hippocampi symmetric with no abnormal signal or enhancement seen. Routine EEG reported as normal. 24-hour EEG showed occasional focal left temporal slowing and left temporal epileptiform discharges in sleep. No typical events captured.  PAST MEDICAL HISTORY: Past Medical History  Diagnosis Date  . Hypertension   . Migraine   . Anxiety     MEDICATIONS: Current Outpatient Prescriptions on File Prior to Visit  Medication Sig Dispense Refill  . Eslicarbazepine Acetate 800 MG TABS Take 800 mg by mouth daily. 30 tablet 4  . ibuprofen (ADVIL,MOTRIN) 200 MG tablet Take 400 mg by mouth every 6 (six) hours as needed for headache.    . nortriptyline (PAMELOR) 10 MG capsule Take 1 capsule (10 mg total) by mouth at bedtime. 30 capsule 6  . ondansetron (ZOFRAN) 4 MG tablet Take 1 tablet (4 mg total) by mouth every 8 (eight) hours as needed for nausea or vomiting. 12 tablet 0  . pantoprazole (PROTONIX) 40 MG tablet Take 1 tablet (40 mg total) by mouth daily. 14 tablet 0   No current facility-administered medications on file prior to visit.    ALLERGIES: Allergies  Allergen Reactions  . Depakote Er [Divalproex Sodium Er] Other (See Comments)    Caused burns on arm    FAMILY HISTORY: Family History  Problem Relation Age of Onset  . Hypertension Father   . Diabetes Maternal Grandmother   . Stroke Maternal Grandmother     SOCIAL HISTORY: History   Social History  . Marital Status: Married    Spouse Name: [name]  . Number of Children: 3  . Years of Education: college   Occup[place]  [employer]   Social History Main Topics  . Smoking status: Never Smoker   . Smokeless tobacco: Never Used  . Alcohol Use: 0.6 oz/week    1 Cans of beer per week     Comment: OCC  . Drug Use: No  . Sexual Activity:    Partners: Female   Other Topics Concern  . Not on file   Social History Narrative   Patient lives at home with his wife  [name]([name]) Patient works Full time out of work on leave.    Education some college   Left handed   Caffeine None    REVIEW OF SYSTEMS: Constitutional: No fevers, chills, or sweats, no generalized fatigue, change in appetite Eyes: No visual changes, double vision, eye pain Ear, nose and throat: No hearing loss, ear pain, nasal congestion, sore throat Cardiovascular: No chest pain, palpitations Respiratory:  No shortness of breath at rest or with exertion, wheezes GastrointestinaI: No nausea, vomiting, diarrhea, abdominal pain, fecal incontinence Genitourinary:  No dysuria, urinary retention or frequency Musculoskeletal:  + neck pain, back pain Integumentary: No rash, pruritus, skin lesions Neurological: as above Psychiatric: No depression, insomnia, anxiety Endocrine: No palpitations, fatigue, diaphoresis, mood swings, change in appetite, change in weight, increased thirst Hematologic/Lymphatic:  No anemia, purpura, petechiae.  Allergic/Immunologic: no itchy/runny eyes, nasal congestion, recent allergic reactions, rashes  PHYSICAL EXAM: Filed Vitals:   07/25/14 0937  BP: 136/88  Pulse: 84  Resp: 16   General: No acute distress, able to answer orientation questions, but is noted to be confused, repeating the same questions, says he wants to go home now Head:  Normocephalic/atraumatic Neck: supple, no paraspinal tenderness, full range of motion Heart:  Regular rate and rhythm Lungs:  Clear to auscultation bilaterally Back: No paraspinal tenderness Skin/Extremities: No rash, no edema Neurological Exam: alert and oriented to person, place, and time. No aphasia or dysarthria. Attention and concentration are impaired. Able to name objects and repeat phrases. Cranial nerves: Pupils equal, round, reactive to light.  Fundoscopic exam unremarkable, no papilledema. Extraocular movements intact with no nystagmus. Visual fields full. Facial sensation intact. No facial asymmetry. Tongue, uvula,  palate midline.  Motor: Bulk and tone normal, muscle strength 5/5 throughout with no pronator drift.  Sensation to light touch intact.  No extinction to double simultaneous stimulation.  Deep tendon reflexes 2+ throughout, toes downgoing.  Finger to nose testing intact.  Gait not tested.  IMPRESSION: This is a 29 yo LH man with a new onset generalized convulsions last February 2015 when he had 2 convulsions in one day. He has had several seizures since then, 24-hour EEG showed left temporal epileptiform discharges, MRI brain normal. 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 He drove to the office today due to feeling unwell, headaches, stating he had a seizure last night. He is currently on Aptiom /day. He is noted to be confused in the office today, this may be post-ictal, however he needs further evaluation with head CT, infectious workup, he may need an LP as well. I spoke to ER physican Dr. Gwendolyn Grant, he will be transferred to the ER for further evaluation.   Thank you for allowing me to participate in his care.  Please do not hesitate to call for any questions or concerns.  The duration of this appointment visit was 15 minutes of face-to-face time with the patient.  Greater than 50% of this time was spent in counseling, explanation of diagnosis, planning of further management, and coordination of care.   Patrcia Dolly, M.D.   CC: Dr. [name]

## 2014-07-25 NOTE — ED Notes (Signed)
Patient transported to CT 

## 2014-07-25 NOTE — Patient Instructions (Signed)
1. Go to ER for further evaluation 2. NO DRIVING AFTER A SEIZURE  Seizure Precautions: 1. If medication has been prescribed for you to prevent seizures, take it exactly as directed.  Do not stop taking the medicine without talking to your doctor first, even if you have not had a seizure in a long time.   2. Avoid activities in which a seizure would cause danger to yourself or to others.  Don't operate dangerous machinery, swim alone, or climb in high or dangerous places, such as on ladders, roofs, or girders.  Do not drive unless your doctor says you may.  3. If you have any warning that you may have a seizure, lay down in a safe place where you can't hurt yourself.    4.  No driving for 6 months from last seizure, [place].   Please refer to the following link on the Epilepsy Foundation of America's website for more information: http://www.epilepsyfoundation.org/answerplace/Social/driving/drivingu.cfm   5.  Maintain good sleep hygiene.  6.  Contact your doctor if you have any problems that may be related to the medicine you are taking.  7.  Call 911 and bring the patient back to the ED if:        A.  The seizure lasts longer than 5 minutes.       B.  The patient doesn't awaken shortly after the seizure  C.  The patient has new problems such as difficulty seeing, speaking or moving  D.  The patient was injured during the seizure  E.  The patient has a temperature over 102 F (39C)  F.  The patient vomited and now is having trouble breathing

## 2014-07-25 NOTE — ED Notes (Signed)
MD Lynelle DoctorKnapp notified pt started having numbness to right face and left leg starting this morning.

## 2014-07-25 NOTE — Discharge Instructions (Signed)

## 2014-09-14 ENCOUNTER — Telehealth: Payer: Self-pay | Admitting: Neurology

## 2014-09-14 NOTE — Telephone Encounter (Signed)
Patient informed that there are no samples available.  I offered to call some in but he said that he would try back later to see if we get any samples in.

## 2014-09-14 NOTE — Telephone Encounter (Signed)
Please advise.  I did not see anything mentioned in the last 2 notes about samples.  Do you know what he is asking for?

## 2014-09-14 NOTE — Telephone Encounter (Signed)
Pt called and wanted to know if Dr Karel JarvisAquino had anymore samples of seizure medicine/Dawn CB# [phone]

## 2014-09-14 NOTE — Telephone Encounter (Signed)
It is for Aptiom, he is on  daily. Can you pls check if we still have samples for him? Pls also ask him if long-term he would be able to take this medication, it is not feasible to be on samples long-term. Thanks

## 2014-09-22 ENCOUNTER — Emergency Department (HOSPITAL_COMMUNITY)
Admission: EM | Admit: 2014-09-22 | Discharge: 2014-09-23 | Disposition: A | Discharge status: Home / Self Care | Payer: Medicaid Other | Attending: Emergency Medicine | Admitting: Emergency Medicine

## 2014-09-22 ENCOUNTER — Encounter (HOSPITAL_COMMUNITY): Payer: Self-pay | Admitting: Emergency Medicine

## 2014-09-22 DIAGNOSIS — I10 Essential (primary) hypertension: Secondary | ICD-10-CM | POA: Insufficient documentation

## 2014-09-22 DIAGNOSIS — Z8659 Personal history of other mental and behavioral disorders: Secondary | ICD-10-CM | POA: Insufficient documentation

## 2014-09-22 DIAGNOSIS — R569 Unspecified convulsions: Secondary | ICD-10-CM

## 2014-09-22 DIAGNOSIS — G43909 Migraine, unspecified, not intractable, without status migrainosus: Secondary | ICD-10-CM | POA: Insufficient documentation

## 2014-09-22 DIAGNOSIS — E860 Dehydration: Secondary | ICD-10-CM | POA: Insufficient documentation

## 2014-09-22 DIAGNOSIS — G40909 Epilepsy, unspecified, not intractable, without status epilepticus: Secondary | ICD-10-CM | POA: Insufficient documentation

## 2014-09-22 LAB — BASIC METABOLIC PANEL
Anion gap: 14 (ref 5–15)
BUN: 17 mg/dL (ref 6–20)
CO2: 24 mmol/L (ref 22–32)
Calcium: 9.9 mg/dL (ref 8.9–10.3)
Chloride: 100 mmol/L — ABNORMAL LOW (ref 101–111)
Creatinine, Ser: 2.01 mg/dL — ABNORMAL HIGH (ref 0.61–1.24)
GFR, EST AFRICAN AMERICAN: 51 mL/min — AB (ref >60)
GFR, EST NON AFRICAN AMERICAN: 44 mL/min — AB (ref >60)
Glucose, Bld: 128 mg/dL — ABNORMAL HIGH (ref 65–99)
Potassium: 3.5 mmol/L (ref 3.5–5.1)
SODIUM: 138 mmol/L (ref 135–145)

## 2014-09-22 MED ORDER — METOCLOPRAMIDE HCL 5 MG/ML IJ SOLN
10.0000 mg | Freq: Once | INTRAMUSCULAR | Status: AC
  Administered 2014-09-22: 10 mg via INTRAVENOUS
  Filled 2014-09-22: qty 2

## 2014-09-22 MED ORDER — SODIUM CHLORIDE 0.9 % IV BOLUS (SEPSIS)
2000.0000 mL | Freq: Once | INTRAVENOUS | Status: AC
  Administered 2014-09-22: 2000 mL via INTRAVENOUS

## 2014-09-22 MED ORDER — DIPHENHYDRAMINE HCL 50 MG/ML IJ SOLN
25.0000 mg | Freq: Once | INTRAMUSCULAR | Status: AC
  Administered 2014-09-22: 25 mg via INTRAVENOUS
  Filled 2014-09-22: qty 1

## 2014-09-22 NOTE — ED Notes (Signed)
Pt. reports seizure episode this evening with palpitations , denies chest pain , respirations unlabored .

## 2014-09-22 NOTE — ED Provider Notes (Signed)
CSN: [id number]     Arrival date & time 09/22/14  2235 History   First MD Initiated Contact with Patient 09/22/14 2248     Chief Complaint  Patient presents with  . Seizures  . Palpitations     (Consider location/radiation/quality/duration/timing/severity/associated sxs/prior Treatment) Patient is a 29 y.o. male presenting with seizures.  Seizures Seizure activity on arrival: no   Seizure type:  Unable to specify Preceding symptoms: aura (foul smell)   Preceding symptoms: no numbness   Initial focality:  None Episode characteristics: abnormal movements, confusion and disorientation   Episode characteristics: no incontinence   Postictal symptoms: confusion and somnolence   Return to baseline: yes   Severity:  Moderate Duration:  5 minutes Timing:  Once Progression:  Unchanged Context: decreased sleep and emotional upset   Context: not fever and medical compliance   Recent head injury:  No recent head injuries PTA treatment:  None   Past Medical History  Diagnosis Date  . Hypertension   . Migraine   . Anxiety   . Seizures    Past Surgical History  Procedure Laterality Date  . Knee surgery Right    Family History  Problem Relation Age of Onset  . Hypertension Father   . Diabetes Maternal Grandmother   . Stroke Maternal Grandmother    History  Substance Use Topics  . Smoking status: Never Smoker   . Smokeless tobacco: Never Used  . Alcohol Use: Yes    Review of Systems  Neurological: Positive for seizures.  All other systems reviewed and are negative.     Allergies  Depakote er  Home Medications   Prior to Admission medications   Medication Sig Start Date End Date Taking? Authorizing Provider  nortriptyline (PAMELOR) 10 MG capsule Take 1 capsule (10 mg total) by mouth at bedtime. 01/25/14  Yes Van ClinesKaren M [name], MD  Eslicarbazepine Acetate 800 MG TABS Take 800 mg by mouth daily. Patient not taking: Reported on 09/22/2014 07/04/14   Van ClinesKaren M [name], MD   ondansetron (ZOFRAN) 4 MG tablet Take 1 tablet (4 mg total) by mouth every 8 (eight) hours as needed for nausea or vomiting. Patient not taking: Reported on 07/25/2014 04/16/14   Ladona MowJoe Mintz, PA-C  pantoprazole (PROTONIX) 40 MG tablet Take 1 tablet (40 mg total) by mouth daily. Patient not taking: Reported on 09/22/2014 04/16/14   Ladona MowJoe Mintz, PA-C   BP 124/85 mmHg  Pulse 97  Temp(Src) 98.5 F (36.9 C) (Oral)  Resp 21  Wt 272 lb (123.378 kg)  SpO2 96% Physical Exam  Constitutional: He is oriented to person, place, and time. He appears well-developed and well-nourished.  HENT:  Head: Normocephalic and atraumatic.  Eyes: Conjunctivae and EOM are normal.  Neck: Normal range of motion. Neck supple.  Cardiovascular: Normal rate, regular rhythm and normal heart sounds.   Pulmonary/Chest: Effort normal and breath sounds normal. No respiratory distress.  Abdominal: He exhibits no distension. There is no tenderness. There is no rebound and no guarding.  Musculoskeletal: Normal range of motion.  Neurological: He is alert and oriented to person, place, and time.  Skin: Skin is warm and dry.  Vitals reviewed.   ED Course  Procedures (including critical care time) Labs Review Labs Reviewed  BASIC METABOLIC PANEL - Abnormal; Notable for the following:    Chloride 100 (*)    Glucose, Bld 128 (*)    Creatinine, Ser 2.01 (*)    GFR calc non Af Amer 44 (*)    GFR calc  Af Amer 51 (*)    All other components within normal limits  CBC WITH DIFFERENTIAL/PLATELET - Abnormal; Notable for the following:    WBC 18.5 (*)    RBC 6.26 (*)    Hemoglobin 17.4 (*)    Neutro Abs 13.3 (*)    Monocytes Absolute 1.9 (*)    All other components within normal limits    Imaging Review No results found.   EKG Interpretation   Date/Time:  Friday September 22 2014 22:45:54 EDT Ventricular Rate:  129 PR Interval:  130 QRS Duration: 78 QT Interval:  296 QTC Calculation: 433 R Axis:   24 Text Interpretation:    Poor data quality, interpretation may be  adversely affected Sinus tachycardia with Premature ventricular complexes  or Fusion complexes Otherwise normal ECG SINCE LAST TRACING HEART RATE HAS  INCREASED Confirmed by Mirian Mo [phone]) on 09/22/2014 10:58:51 PM      MDM   Final diagnoses:  Seizure  Dehydration    29 y.o. male with pertinent PMH of HTN, seizures, anxiety, migraines presents with recurrent seizure.  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  No missed dosing, however patient does endorse working outside in the heat excessively for the past 2 days.  On arrival today vitals signs and physical exam as above. Patient tachycardic on arrival, appears dry. He was given 2 L of normal saline and had vast improvement in all symptoms. He did still have some persistent crampy abdominal pain.  Headache relieved with Reglan and Benadryl. Patient did have elevation in his creatinine, discussed these results with the patient and offered admission, patient elected to return home with close PCP follow-up for creatinine recheck. As the patient has a clear etiology for an elevation in his creatinine and symptoms, feel discharge reasonable as he is able to take by mouth intake without difficulty. Discharged home in stable condition.  I have reviewed all laboratory and imaging studies if ordered as above  1. Seizure   2. Dehydration         Mirian Mo, MD 09/23/14 [phone]

## 2014-09-23 LAB — CBC WITH DIFFERENTIAL/PLATELET
BASOS ABS: 0 10*3/uL (ref 0.0–0.1)
Basophils Relative: 0 % (ref 0–1)
EOS PCT: 0 % (ref 0–5)
Eosinophils Absolute: 0 10*3/uL (ref 0.0–0.7)
HEMATOCRIT: 50.7 % (ref 39.0–52.0)
Hemoglobin: 17.4 g/dL — ABNORMAL HIGH (ref 13.0–17.0)
Lymphocytes Relative: 18 % (ref 12–46)
Lymphs Abs: 3.3 10*3/uL (ref 0.7–4.0)
MCH: 27.8 pg (ref 26.0–34.0)
MCHC: 34.3 g/dL (ref 30.0–36.0)
MCV: 81 fL (ref 78.0–100.0)
Monocytes Absolute: 1.9 10*3/uL — ABNORMAL HIGH (ref 0.1–1.0)
Monocytes Relative: 10 % (ref 3–12)
NEUTROS ABS: 13.3 10*3/uL — AB (ref 1.7–7.7)
NEUTROS PCT: 72 % (ref 43–77)
PLATELETS: 247 10*3/uL (ref 150–400)
RBC: 6.26 MIL/uL — ABNORMAL HIGH (ref 4.22–5.81)
RDW: 13.6 % (ref 11.5–15.5)
WBC: 18.5 10*3/uL — AB (ref 4.0–10.5)

## 2014-09-23 NOTE — Discharge Instructions (Signed)
Dehydration, Adult Dehydration is when you lose more fluids from the body than you take in. Vital organs like the kidneys, brain, and heart cannot function without a proper amount of fluids and salt. Any loss of fluids from the body can cause dehydration.  CAUSES   Vomiting.  Diarrhea.  Excessive sweating.  Excessive urine output.  Fever. SYMPTOMS  Mild dehydration  Thirst.  Dry lips.  Slightly dry mouth. Moderate dehydration  Very dry mouth.  Sunken eyes.  Skin does not bounce back quickly when lightly pinched and released.  Dark urine and decreased urine production.  Decreased tear production.  Headache. Severe dehydration  Very dry mouth.  Extreme thirst.  Rapid, weak pulse (more than 100 beats per minute at rest).  Cold hands and feet.  Not able to sweat in spite of heat and temperature.  Rapid breathing.  Blue lips.  Confusion and lethargy.  Difficulty being awakened.  Minimal urine production.  No tears. DIAGNOSIS  Your caregiver will diagnose dehydration based on your symptoms and your exam. Blood and urine tests will help confirm the diagnosis. The diagnostic evaluation should also identify the cause of dehydration. TREATMENT  Treatment of mild or moderate dehydration can often be done at home by increasing the amount of fluids that you drink. It is best to drink small amounts of fluid more often. Drinking too much at one time can make vomiting worse. Refer to the home care instructions below. Severe dehydration needs to be treated at the hospital where you will probably be given intravenous (IV) fluids that contain water and electrolytes. HOME CARE INSTRUCTIONS   Ask your caregiver about specific rehydration instructions.  Drink enough fluids to keep your urine clear or pale yellow.  Drink small amounts frequently if you have nausea and vomiting.  Eat as you normally do.  Avoid:  Foods or drinks high in sugar.  Carbonated  drinks.  Juice.  Extremely hot or cold fluids.  Drinks with caffeine.  Fatty, greasy foods.  Alcohol.  Tobacco.  Overeating.  Gelatin desserts.  Wash your hands well to avoid spreading bacteria and viruses.  Only take over-the-counter or prescription medicines for pain, discomfort, or fever as directed by your caregiver.  Ask your caregiver if you should continue all prescribed and over-the-counter medicines.  Keep all follow-up appointments with your caregiver. SEEK MEDICAL CARE IF:  You have abdominal pain and it increases or stays in one area (localizes).  You have a rash, stiff neck, or severe headache.  You are irritable, sleepy, or difficult to awaken.  You are weak, dizzy, or extremely thirsty. SEEK IMMEDIATE MEDICAL CARE IF:   You are unable to keep fluids down or you get worse despite treatment.  You have frequent episodes of vomiting or diarrhea.  You have blood or green matter (bile) in your vomit.  You have blood in your stool or your stool looks black and tarry.  You have not urinated in 6 to 8 hours, or you have only urinated a small amount of very dark urine.  You have a fever.  You faint. MAKE SURE YOU:   Understand these instructions.  Will watch your condition.  Will get help right away if you are not doing well or get worse. Document Released: 02/24/2005 Document Revised: 05/19/2011 Document Reviewed: 10/14/2010 [facility] Patient Information 2015 [place], [place]. This information is not intended to replace advice given to you by your health care provider. Make sure you discuss any questions you have with your health care  provider.  Epilepsy Epilepsy is a disorder in which a person has repeated seizures over time. A seizure is a release of abnormal electrical activity in the brain. Seizures can cause a change in attention, behavior, or the ability to remain awake and alert (altered mental status). Seizures often involve uncontrollable  shaking (convulsions).  Most people with epilepsy lead normal lives. However, people with epilepsy are at an increased risk of falls, accidents, and injuries. Therefore, it is important to begin treatment right away. CAUSES  Epilepsy has many possible causes. Anything that disturbs the normal pattern of brain cell activity can lead to seizures. This may include:   Head injury.  Birth trauma.  High fever as a child.  Stroke.  Bleeding into or around the brain.  Certain drugs.  Prolonged low oxygen, such as what occurs after CPR efforts.  Abnormal brain development.  Certain illnesses, such as meningitis, encephalitis (brain infection), malaria, and other infections.  An imbalance of nerve signaling chemicals (neurotransmitters).  SIGNS AND SYMPTOMS  The symptoms of a seizure can vary greatly from one person to another. Right before a seizure, you may have a warning (aura) that a seizure is about to occur. An aura may include the following symptoms:  Fear or anxiety.  Nausea.  Feeling like the room is spinning (vertigo).  Vision changes, such as seeing flashing lights or spots. Common symptoms during a seizure include:  Abnormal sensations, such as an abnormal smell or a bitter taste in the mouth.   Sudden, general body stiffness.   Convulsions that involve rhythmic jerking of the face, arm, or leg on one or both sides.   Sudden change in consciousness.   Appearing to be awake but not responding.   Appearing to be asleep but cannot be awakened.   Grimacing, chewing, lip smacking, drooling, tongue biting, or loss of bowel or bladder control. After a seizure, you may feel sleepy for a while. DIAGNOSIS  Your health care provider will ask about your symptoms and take a medical history. Descriptions from any witnesses to your seizures will be very helpful in the diagnosis. A physical exam, including a detailed neurological exam, is necessary. Various tests may be  done, such as:   An electroencephalogram (EEG). This is a painless test of your brain waves. In this test, a diagram is created of your brain waves. These diagrams can be interpreted by a specialist.  An MRI of the brain.   A CT scan of the brain.   A spinal tap (lumbar puncture, LP).  Blood tests to check for signs of infection or abnormal blood chemistry. TREATMENT  There is no cure for epilepsy, but it is generally treatable. Once epilepsy is diagnosed, it is important to begin treatment as soon as possible. For most people with epilepsy, seizures can be controlled with medicines. The following may also be used:  A pacemaker for the brain (vagus nerve stimulator) can be used for people with seizures that are not well controlled by medicine.  Surgery on the brain. For some people, epilepsy eventually goes away. HOME CARE INSTRUCTIONS   Follow your health care provider's recommendations on driving and safety in normal activities.  Get enough rest. Lack of sleep can cause seizures.  Only take over-the-counter or prescription medicines as directed by your health care provider. Take any prescribed medicine exactly as directed.  Avoid any known triggers of your seizures.  Keep a seizure diary. Record what you recall about any seizure, especially any possible trigger.  Make sure the people you live and work with know that you are prone to seizures. They should receive instructions on how to help you. In general, a witness to a seizure should:   Cushion your head and body.   Turn you on your side.   Avoid unnecessarily restraining you.   Not place anything inside your mouth.   Call for emergency medical help if there is any question about what has occurred.   Follow up with your health care provider as directed. You may need regular blood tests to monitor the levels of your medicine.  SEEK MEDICAL CARE IF:   You develop signs of infection or other illness. This  might increase the risk of a seizure.   You seem to be having more frequent seizures.   Your seizure pattern is changing.  SEEK IMMEDIATE MEDICAL CARE IF:   You have a seizure that does not stop after a few moments.   You have a seizure that causes any difficulty in breathing.   You have a seizure that results in a very severe headache.   You have a seizure that leaves you with the inability to speak or use a part of your body.  Document Released: 02/24/2005 Document Revised: 12/15/2012 Document Reviewed: 10/06/2012 [facility] Patient Information 2015 [place]. This information is not intended to replace advice given to you by your health care provider. Make sure you discuss any questions you have with your health care provider.

## 2014-11-23 ENCOUNTER — Telehealth: Payer: Self-pay | Admitting: Neurology

## 2014-11-23 NOTE — Telephone Encounter (Signed)
VM-Pt called and stated he had a seizure and wants to know what preventive measures can he take when he is having a seizure/Dawn CB#[phone]

## 2014-11-23 NOTE — Telephone Encounter (Signed)
Please review

## 2014-11-23 NOTE — Telephone Encounter (Signed)
Returned call and notified him of advisement. Follow up appt scheduled for 9/27.

## 2014-11-23 NOTE — Telephone Encounter (Signed)
He is due for an appt, pls put him in a work-in slot and we will discuss possible preventive measures. If he does have a seizure, have family make sure space is clear around him, nothing to injure him. If he falls, turn him to his side. No driving after a seizure. Thanks

## 2014-11-29 ENCOUNTER — Telehealth: Payer: Self-pay | Admitting: Neurology

## 2014-11-29 NOTE — Telephone Encounter (Signed)
Pt wants to talk to you concerning his new meds (didn't name them)/ said he is currently homeless and possibly relocating/ call back @ [phone]

## 2014-11-30 NOTE — Telephone Encounter (Signed)
Lmovm to return my call. 

## 2014-12-05 ENCOUNTER — Ambulatory Visit: Payer: Medicaid Other | Admitting: Neurology

## 2014-12-05 NOTE — Telephone Encounter (Signed)
Spoke to Loistine Chance, he is in [place] staying with in-laws because house was foreclosed in [place]. He had a big seizure 2 weeks ago and was brought to [facility] and started on Keppra  BID, initially drowsy, now tolerating it better. He had another seizure 3 days after hospital discharge, hitting his head on the edge of bed and having back pain and leg numbness. Valium caused side effects. He did notice a weird taste before the seizure (first time he experienced that). He stopped Aptiom in June, has only been taking nortriptyline. Has noticed headaches are a lot better except from hitting head. He will be back in [place] next month, and knows to call for any changes. We will plan to increase Keppra later on. He liked how he felt on lamictal, but had insurance issues on this.

## 2014-12-05 NOTE — Telephone Encounter (Signed)
I tried calling him again and was able to speak with him. He states that he is homeless right now. He states that he had seizures on 8/20 & 9/9, he states that they were pretty severe. He is taking Keppra 500 mg twice daily which he says they started him on at the hospital in [place]. He is still currently in [place] and will be back in [place] sometime in October but doesn't know exactly when. He states he would like to talk to you about where he should do from this point. He states that he hasn't taken the nortriptyline since he's been taking the Keppra.

## 2014-12-28 ENCOUNTER — Telehealth: Payer: Self-pay | Admitting: Neurology

## 2014-12-28 NOTE — Telephone Encounter (Signed)
Pt states that he is calling to give us a update on his Keppra it is not working please call him at [phone]

## 2014-12-29 NOTE — Telephone Encounter (Signed)
Called number left several times and number not working. Also tried number we have on file for patient and it's no longer is service.

## 2015-01-01 ENCOUNTER — Telehealth: Payer: Self-pay | Admitting: Neurology

## 2015-01-01 MED ORDER — LEVETIRACETAM 500 MG PO TABS: 500.0000 mg | ORAL_TABLET | Freq: Two times a day (BID) | ORAL | Status: DC

## 2015-01-01 NOTE — Telephone Encounter (Signed)
I spoke with him. He states he's been out of his Keppra for the past 4 days. Wanted a Rx sent to the pharmacy. He has been taking 500 mg bid. He says you mentioned to him that he may need to increase it to 1000 mg. I explained to him that you were out of the office but in the meantime I would send in Rx for Keppra 500 mg so he could start back on medication asap. I told him I would call him back if you wanted to change dose. He states he hasn't anymore seizures.

## 2015-01-01 NOTE — Telephone Encounter (Signed)
Pt phone number [phone] patient needs to talk to someone about changing rx from the keppra to something else please call

## 2015-01-01 NOTE — Telephone Encounter (Signed)
PT called in regards to his medication Keppra which he is out of and wanted to know if he needed a new prescription/Dawn CB# [phone]

## 2015-01-01 NOTE — Telephone Encounter (Signed)
Keppra 500mg  BID ok for now, call if any seizures and also pls stress to call us before he runs out of medication, I don't want him to be out of medication. Thanks

## 2015-04-24 ENCOUNTER — Other Ambulatory Visit: Payer: Self-pay | Admitting: Neurology

## 2015-04-25 ENCOUNTER — Telehealth: Payer: Self-pay | Admitting: Neurology

## 2015-04-25 NOTE — Telephone Encounter (Signed)
Returned call to patient. He wanted to see if Dr. Karel Jarvis had any earlier appts to review disability. He is scheduled for a f/u on 2/24. I did tell him that would be the soonest we could get him in.

## 2015-04-25 NOTE — Telephone Encounter (Signed)
Pt needs to talk to someone about disability please call [phone]

## 2015-05-04 ENCOUNTER — Ambulatory Visit (INDEPENDENT_AMBULATORY_CARE_PROVIDER_SITE_OTHER): Payer: BLUE CROSS/BLUE SHIELD | Admitting: Neurology

## 2015-05-04 ENCOUNTER — Encounter: Payer: Self-pay | Admitting: Neurology

## 2015-05-04 VITALS — BP 150/80 | HR 98 | Ht 71.0 in | Wt 276.0 lb

## 2015-05-04 DIAGNOSIS — G40009 Localization-related (focal) (partial) idiopathic epilepsy and epileptic syndromes with seizures of localized onset, not intractable, without status epilepticus: Secondary | ICD-10-CM

## 2015-05-04 DIAGNOSIS — G43009 Migraine without aura, not intractable, without status migrainosus: Secondary | ICD-10-CM | POA: Diagnosis not present

## 2015-05-04 MED ORDER — SUMATRIPTAN-NAPROXEN SODIUM 85-500 MG PO TABS: 1.0000 | ORAL_TABLET | ORAL | Status: DC | PRN

## 2015-05-04 MED ORDER — CYCLOBENZAPRINE HCL 5 MG PO TABS: 5.0000 mg | ORAL_TABLET | Freq: Three times a day (TID) | ORAL | Status: DC | PRN

## 2015-05-04 MED ORDER — LEVETIRACETAM 500 MG PO TABS: ORAL_TABLET | ORAL | Status: DC

## 2015-05-04 MED ORDER — ZOLMITRIPTAN 5 MG NA SOLN: 1.0000 | NASAL | Status: DC | PRN

## 2015-05-04 MED ORDER — NORTRIPTYLINE HCL 10 MG PO CAPS: ORAL_CAPSULE | ORAL | Status: DC

## 2015-05-04 NOTE — Progress Notes (Signed)
NEUROLOGY FOLLOW UP OFFICE NOTE  Peter Barr [id number]  HISTORY OF PRESENT ILLNESS: I had the pleasure of seeing Peter Barr in follow-up in the neurology clinic on 05/04/2015.  The patient was last seen 9 months ago for recurrent seizures. Records since his last visit were reviewed. He was at [facility] ER in July 2016 for a seizure. At that time he reported taking the Aptiom. He called our office on 11/29/14 to report a seizure and was scheduled for an urgent visit, but in the interim they started having housing issues and had to move temporarily to be with family in [place]. He reported seizures on 8/8 and 9/9, and was started on Keppra  BID in [facility] in [place]. Seizures were preceded by gustatory hallucination of a weird taste. He reported that he stopped the Aptiom in June and was only taking nortriptyline and Keppra. His last call to our office was in October 2016, then he called back last week to see if there were any appointments to review disability. He presents today reporting that he has not had any seizures since November on Keppra  BID, however he feels that he is regressing. 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 His energy level is low. His wife has noticed things when he is sleeping, he would be exhausted on waking up, still with continued headaches with head pounding 2-3 hours daily with photosensitivity, no nausea or vomiting, vision off even with his glasses, numbness on his right cheek. His wife also noticed a change in his speech and walking. He reports mood is "okay," he is just tired all the time. He has the olfactory hallucination ("sour smell") once a week. His vision is so blurred that he needs help when he stays with the children. He reports falling often, his right cheek would go numb,  then his right leg feels numb and he would fall.   HPI: This is a 30 yo LH man with new onset seizures last 04/16/2013. He came home from a typical 13-hour 3rd shift, came home and recalls his wife fixing breakfast, then has no recollection of events until he was at [facility] ER. His wife reported that he was talking then suddenly tensed up and fell to his right side, with tonic-clonic activity repeatedly hitting his head on the child safety gate in their kitchen for 3-5 minutes. This was followed by sonorous respirations. He was brought to [facility] ER where he had a second GTC after the CT head. He was given an IV load of Cerebryx, which apparently caused burning in the IV site. He was switched from Dilantin and discharged on Vimpat  BID. He had seen neurologist Dr. Terrace Arabia and reported continued to have back pain and paresthesias in both feet. MRI thoracic spine was unremarkable. He continues on meloxicam and still has the back pain. In March, he was in the ER for dizziness with sudden onset spinning sensation. He was diagnosed with vertigo and discharged home. He also reported fatigue, lack of appetite, poor balance.   He had a nocturnal convulsion in April 2015. In September 2015, he had a complex partial seizure with behavioral arrest, followed by a weird sound, "like a screech," then started moving his body back and forth, hands clenched at his sides. The episode lasted 30 seconds, followed  by confusion for 2 minutes. He had no recollection of the episode and denied any warning symptoms. His wife expressed concern that he has been "strange" lately, almost daily, sometimes 1-2 times a day, he would gaze out, then ask her what she had said, lasting a minute or so. He doesn't realize he stopped talking, but she notes he is briefly confused after. He had a complex partial seizure on Christmas day 2015. He recalls feeling tired, then was unresponsive to his wife, who noticed his face was droopy, he was drooling, and  had a weird taste in his mouth when he came to. He recalls his head was spinning after. He was fine the next day.   Epilepsy Risk Factors: His father had febrile convulsions as a child. Otherwise he had a normal birth and early development. There is no history of febrile convulsions, CNS infections such as meningitis/encephalitis, significant traumatic brain injury, neurosurgical procedures, or family history of seizures.  Diagnostic Data: I personally reviewed MRI brain with and without contrast which was normal, hippocampi symmetric with no abnormal signal or enhancement seen. Routine EEG reported as normal. 24-hour EEG showed occasional focal left temporal slowing and left temporal epileptiform discharges in sleep. No typical events captured.  PAST MEDICAL HISTORY: Past Medical History  Diagnosis Date  . Hypertension   . Migraine   . Anxiety   . Seizures (HCC)     MEDICATIONS: Current Outpatient Prescriptions on File Prior to Visit  Medication Sig Dispense Refill  . levETIRAcetam (KEPPRA) 500 MG tablet TAKE 1 TABLET(500 MG) BY MOUTH TWICE DAILY 60 tablet 1  . nortriptyline (PAMELOR) 10 MG capsule Take 1 capsule (10 mg total) by mouth at bedtime. 30 capsule 6  . pantoprazole (PROTONIX) 40 MG tablet Take 1 tablet (40 mg total) by mouth daily. 14 tablet 0   No current facility-administered medications on file prior to visit.    ALLERGIES: Allergies  Allergen Reactions  . Depakote Er [Divalproex Sodium Er] Other (See Comments)    Caused burns on arm    FAMILY HISTORY: Family History  Problem Relation Age of Onset  . Hypertension Father   . Diabetes Maternal Grandmother   . Stroke Maternal Grandmother     SOCIAL HISTORY: Social History   Social History  . Marital Status: Married    Spouse Name: [name]  . Number of Children: 3  . Years of Education: college   Occupational History  .  [employer]   Social History Main Topics  . Smoking status: Never Smoker   .  Smokeless tobacco: Never Used  . Alcohol Use: Yes  . Drug Use: No  . Sexual Activity: Not on file   Other Topics Concern  . Not on file   Social History Narrative   Patient lives at home with his wife [name]) Patient works Full time out of work on leave.    Education some college   Left handed   Caffeine None    REVIEW OF SYSTEMS: Constitutional: No fevers, chills, or sweats, + generalized fatigue, change in appetite Eyes: No visual changes, double vision, eye pain Ear, nose and throat: No hearing loss, ear pain, nasal congestion, sore throat Cardiovascular: No chest pain, palpitations Respiratory:  No shortness of breath at rest or with exertion, wheezes GastrointestinaI: No nausea, vomiting, diarrhea, abdominal pain, fecal incontinence Genitourinary:  No dysuria, urinary retention or frequency Musculoskeletal:  + neck pain, back pain Integumentary: No rash, pruritus, skin lesions Neurological: as above Psychiatric: No depression, insomnia,  anxiety Endocrine: No palpitations, fatigue, diaphoresis, mood swings, change in appetite, change in weight, increased thirst Hematologic/Lymphatic:  No anemia, purpura, petechiae. Allergic/Immunologic: no itchy/runny eyes, nasal congestion, recent allergic reactions, rashes  PHYSICAL EXAM: Filed Vitals:   05/04/15 1306  BP: 150/80  Pulse: 98   General: No acute distress Head:  Normocephalic/atraumatic Neck: supple, no paraspinal tenderness, full range of motion Heart:  Regular rate and rhythm Lungs:  Clear to auscultation bilaterally Back: No paraspinal tenderness Skin/Extremities: No rash, no edema Neurological Exam: alert and oriented to person, place, and time. No aphasia or dysarthria. Attention and concentration are impaired. Able to name objects and repeat phrases. Cranial nerves: Pupils equal, round, reactive to light.  Fundoscopic exam unremarkable, no papilledema. Extraocular movements intact with no nystagmus. Visual  fields full. Facial sensation intact. No facial asymmetry. Tongue, uvula, palate midline.  Motor: Bulk and tone normal, muscle strength 5/5 throughout with no pronator drift.  Sensation to light touch intact.  No extinction to double simultaneous stimulation.  Deep tendon reflexes 2+ throughout, toes downgoing.  Finger to nose testing intact.  Gait not tested.  IMPRESSION: This is a 30 yo LH man with a new onset generalized convulsions last February 2015 when he had 2 convulsions in one day. He has had several seizures since then, 24-hour EEG showed left temporal epileptiform discharges, MRI brain normal. 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 He has been seizure-free on Keppra  BID since November 2016, however does not feel good on the medication. He felt much better on Lamotrigine, and is hoping to restart this once insurance issues are resolved. He continues to have frequent headaches and will increase nortriptyline to  qhs over the next week. He was given samples of Treximet and Zomig to try at the onset of migraine, and will let us know if they are effective so refills can be called in. We again discussed [place] driving laws that indicate one should not drive until 6 months seizure-free. He will follow-up in 1 month and knows to call for any problems.   Thank you for allowing me to participate in his care.  Please do not hesitate to call for any questions or concerns.  The duration of this appointment visit was 25 minutes of face-to-face time with the patient.  Greater than 50% of this time was spent in counseling, explanation of diagnosis, planning of further management, and coordination of care.   Patrcia Dolly, M.D.   CC: Dr. [name]

## 2015-05-04 NOTE — Patient Instructions (Signed)
1. Increase nortriptyline : Take 2 capsules at night for 1 week, then increase to 3 capsules at night 2. Try samples of Treximet and Zomig one week at a time, let us know which is helpful for the headaches and we will call in a prescription 3. Call our office once you get your new prescription, and we will plan to restart Lamictal. Continue Keppra  twice a day 4. Follow-up in 1 month  Seizure Precautions: 1. If medication has been prescribed for you to prevent seizures, take it exactly as directed.  Do not stop taking the medicine without talking to your doctor first, even if you have not had a seizure in a long time.   2. Avoid activities in which a seizure would cause danger to yourself or to others.  Don't operate dangerous machinery, swim alone, or climb in high or dangerous places, such as on ladders, roofs, or girders.  Do not drive unless your doctor says you may.  3. If you have any warning that you may have a seizure, lay down in a safe place where you can't hurt yourself.    4.  No driving for 6 months from last seizure, as per [facility].   Please refer to the following link on the [employer]'s website for more information: http://www.epilepsyfoundation.org/answerplace/Social/driving/drivingu.cfm   5.  Maintain good sleep hygiene. Avoid alcohol.  6.  Contact your doctor if you have any problems that may be related to the medicine you are taking.  7.  Call 911 and bring the patient back to the ED if:        A.  The seizure lasts longer than 5 minutes.       B.  The patient doesn't awaken shortly after the seizure  C.  The patient has new problems such as difficulty seeing, speaking or moving  D.  The patient was injured during the seizure  E.  The patient has a temperature over 102 F (39C)  F.  The patient vomited and now is having trouble breathing

## 2015-05-09 DIAGNOSIS — G40019 Localization-related (focal) (partial) idiopathic epilepsy and epileptic syndromes with seizures of localized onset, intractable, without status epilepticus: Secondary | ICD-10-CM | POA: Insufficient documentation

## 2015-05-09 DIAGNOSIS — G43009 Migraine without aura, not intractable, without status migrainosus: Secondary | ICD-10-CM | POA: Insufficient documentation

## 2015-05-14 ENCOUNTER — Telehealth: Payer: Self-pay | Admitting: Neurology

## 2015-05-14 MED ORDER — LAMOTRIGINE ER 25 MG PO TB24: ORAL_TABLET | ORAL | Status: DC

## 2015-05-14 NOTE — Telephone Encounter (Signed)
Good to know, he will still need to continue Keppra when starting the Lamictal, we can't stop it cold [place], that is unsafe. Because he has been off the Lamictal, he will need to slowly increase again. Start Lamotrigine ER 25mg : Take 1 tablet daily for 1 week, then increase to 2 tablets daily for 1 week, then 4 tablets daily and continue. When I see him on his next visit, we will check on how he is doing. No driving after a seizure. Thanks

## 2015-05-14 NOTE — Telephone Encounter (Signed)
Pt called and said he had another seizure last night.  He has insurance now and Is ready to start on meds that was discussed. Please call him.

## 2015-05-14 NOTE — Telephone Encounter (Signed)
Spoke with patient about starting lamotrigine again. I did verbally give him his titration dosing. He is to stay on the Keppra for now.

## 2015-05-14 NOTE — Telephone Encounter (Signed)
Please review

## 2015-05-15 ENCOUNTER — Emergency Department (HOSPITAL_COMMUNITY): Payer: BLUE CROSS/BLUE SHIELD

## 2015-05-15 ENCOUNTER — Encounter (HOSPITAL_COMMUNITY): Payer: Self-pay | Admitting: Emergency Medicine

## 2015-05-15 ENCOUNTER — Emergency Department (HOSPITAL_COMMUNITY)
Admission: EM | Admit: 2015-05-15 | Discharge: 2015-05-16 | Disposition: A | Discharge status: Home / Self Care | Payer: BLUE CROSS/BLUE SHIELD | Attending: Emergency Medicine | Admitting: Emergency Medicine

## 2015-05-15 DIAGNOSIS — I10 Essential (primary) hypertension: Secondary | ICD-10-CM | POA: Insufficient documentation

## 2015-05-15 DIAGNOSIS — M545 Low back pain: Secondary | ICD-10-CM | POA: Insufficient documentation

## 2015-05-15 DIAGNOSIS — Z79899 Other long term (current) drug therapy: Secondary | ICD-10-CM | POA: Insufficient documentation

## 2015-05-15 DIAGNOSIS — R569 Unspecified convulsions: Secondary | ICD-10-CM | POA: Insufficient documentation

## 2015-05-15 DIAGNOSIS — F419 Anxiety disorder, unspecified: Secondary | ICD-10-CM | POA: Diagnosis not present

## 2015-05-15 DIAGNOSIS — J019 Acute sinusitis, unspecified: Secondary | ICD-10-CM | POA: Diagnosis not present

## 2015-05-15 DIAGNOSIS — G43909 Migraine, unspecified, not intractable, without status migrainosus: Secondary | ICD-10-CM | POA: Insufficient documentation

## 2015-05-15 DIAGNOSIS — R42 Dizziness and giddiness: Secondary | ICD-10-CM | POA: Insufficient documentation

## 2015-05-15 DIAGNOSIS — J329 Chronic sinusitis, unspecified: Secondary | ICD-10-CM

## 2015-05-15 LAB — DIFFERENTIAL
BASOS PCT: 0 %
Basophils Absolute: 0 10*3/uL (ref 0.0–0.1)
Eosinophils Absolute: 0.2 10*3/uL (ref 0.0–0.7)
Eosinophils Relative: 2 %
LYMPHS ABS: 2 10*3/uL (ref 0.7–4.0)
Lymphocytes Relative: 21 %
MONOS PCT: 9 %
Monocytes Absolute: 0.9 10*3/uL (ref 0.1–1.0)
NEUTROS ABS: 6.6 10*3/uL (ref 1.7–7.7)
NEUTROS PCT: 68 %

## 2015-05-15 LAB — CBC
HEMATOCRIT: 47.3 % (ref 39.0–52.0)
HEMOGLOBIN: 16.4 g/dL (ref 13.0–17.0)
MCH: 27.8 pg (ref 26.0–34.0)
MCHC: 34.7 g/dL (ref 30.0–36.0)
MCV: 80.2 fL (ref 78.0–100.0)
Platelets: 223 10*3/uL (ref 150–400)
RBC: 5.9 MIL/uL — AB (ref 4.22–5.81)
RDW: 13.2 % (ref 11.5–15.5)
WBC: 9.7 10*3/uL (ref 4.0–10.5)

## 2015-05-15 LAB — I-STAT CHEM 8, ED
BUN: 13 mg/dL (ref 6–20)
Calcium, Ion: 1.22 mmol/L (ref 1.12–1.23)
Chloride: 103 mmol/L (ref 101–111)
Creatinine, Ser: 1.1 mg/dL (ref 0.61–1.24)
Glucose, Bld: 99 mg/dL (ref 65–99)
HEMATOCRIT: 53 % — AB (ref 39.0–52.0)
HEMOGLOBIN: 18 g/dL — AB (ref 13.0–17.0)
Potassium: 3.5 mmol/L (ref 3.5–5.1)
Sodium: 143 mmol/L (ref 135–145)
TCO2: 27 mmol/L (ref 0–100)

## 2015-05-15 LAB — COMPREHENSIVE METABOLIC PANEL
ALBUMIN: 3.8 g/dL (ref 3.5–5.0)
ALT: 29 U/L (ref 17–63)
AST: 23 U/L (ref 15–41)
Alkaline Phosphatase: 88 U/L (ref 38–126)
Anion gap: 12 (ref 5–15)
BILIRUBIN TOTAL: 0.7 mg/dL (ref 0.3–1.2)
BUN: 11 mg/dL (ref 6–20)
CHLORIDE: 104 mmol/L (ref 101–111)
CO2: 28 mmol/L (ref 22–32)
CREATININE: 1.19 mg/dL (ref 0.61–1.24)
Calcium: 9.9 mg/dL (ref 8.9–10.3)
GFR calc Af Amer: >60 mL/min (ref >60)
GLUCOSE: 111 mg/dL — AB (ref 65–99)
POTASSIUM: 3.7 mmol/L (ref 3.5–5.1)
Sodium: 144 mmol/L (ref 135–145)
Total Protein: 7.7 g/dL (ref 6.5–8.1)

## 2015-05-15 LAB — PROTIME-INR
INR: 1.07 (ref 0.00–1.49)
Prothrombin Time: 14.1 seconds (ref 11.6–15.2)

## 2015-05-15 LAB — APTT: APTT: 31 s (ref 24–37)

## 2015-05-15 LAB — I-STAT TROPONIN, ED: TROPONIN I, POC: 0 ng/mL (ref 0.00–0.08)

## 2015-05-15 MED ORDER — AMOXICILLIN 500 MG PO CAPS: 500.0000 mg | ORAL_CAPSULE | Freq: Three times a day (TID) | ORAL | Status: DC

## 2015-05-15 MED ORDER — DEXAMETHASONE SODIUM PHOSPHATE 10 MG/ML IJ SOLN
10.0000 mg | Freq: Once | INTRAMUSCULAR | Status: AC
  Administered 2015-05-15: 10 mg via INTRAMUSCULAR
  Filled 2015-05-15: qty 1

## 2015-05-15 MED ORDER — KETOROLAC TROMETHAMINE 30 MG/ML IJ SOLN
60.0000 mg | Freq: Once | INTRAMUSCULAR | Status: AC
  Administered 2015-05-15: 60 mg via INTRAMUSCULAR
  Filled 2015-05-15: qty 2

## 2015-05-15 MED ORDER — MECLIZINE HCL 25 MG PO TABS: 25.0000 mg | ORAL_TABLET | Freq: Three times a day (TID) | ORAL | Status: DC | PRN

## 2015-05-15 MED ORDER — CETIRIZINE HCL 10 MG PO CAPS: 10.0000 mg | ORAL_CAPSULE | Freq: Every day | ORAL | Status: DC

## 2015-05-15 MED ORDER — FLUTICASONE PROPIONATE 50 MCG/ACT NA SUSP: 2.0000 | Freq: Every day | NASAL | Status: DC

## 2015-05-15 MED ORDER — AMOXICILLIN 500 MG PO CAPS
500.0000 mg | ORAL_CAPSULE | Freq: Once | ORAL | Status: AC
  Administered 2015-05-15: 500 mg via ORAL
  Filled 2015-05-15: qty 1

## 2015-05-15 MED ORDER — MECLIZINE HCL 25 MG PO TABS
50.0000 mg | ORAL_TABLET | Freq: Once | ORAL | Status: AC
  Administered 2015-05-15: 50 mg via ORAL
  Filled 2015-05-15: qty 2

## 2015-05-15 NOTE — ED Notes (Signed)
Pt states "sunday night i think i had a seizure, im not really sure, nobody was home, when i woke up i was in a lot of pain". Pt c/o facial numbness on the right side. Grip strength equal, no facial droop or drift. Hx of seizure disorder since 2015. Sees a neurologist. Pt also feels dizzy and "extreme headache". AAOx4, in NAD.

## 2015-05-15 NOTE — Discharge Instructions (Signed)
Please continue taking your ibuprofen 800 mg every 8 hours which should help with your headaches and back pain. I would recommend stopping your Flexeril because this is likely contributing to your worsening lightheadedness, difficulty ambulating.   Sinusitis, Adult Sinusitis is redness, soreness, and inflammation of the paranasal sinuses. Paranasal sinuses are air pockets within the bones of your face. They are located beneath your eyes, in the middle of your forehead, and above your eyes. In healthy paranasal sinuses, mucus is able to drain out, and air is able to circulate through them by way of your nose. However, when your paranasal sinuses are inflamed, mucus and air can become trapped. This can allow bacteria and other germs to grow and cause infection. Sinusitis can develop quickly and last only a short time (acute) or continue over a long period (chronic). Sinusitis that lasts for more than 12 weeks is considered chronic. CAUSES Causes of sinusitis include:  Allergies.  Structural abnormalities, such as displacement of the cartilage that separates your nostrils (deviated septum), which can decrease the air flow through your nose and sinuses and affect sinus drainage.  Functional abnormalities, such as when the small hairs (cilia) that line your sinuses and help remove mucus do not work properly or are not present. SIGNS AND SYMPTOMS Symptoms of acute and chronic sinusitis are the same. The primary symptoms are pain and pressure around the affected sinuses. Other symptoms include:  Upper toothache.  Earache.  Headache.  Bad breath.  Decreased sense of smell and taste.  A cough, which worsens when you are lying flat.  Fatigue.  Fever.  Thick drainage from your nose, which often is green and may contain pus (purulent).  Swelling and warmth over the affected sinuses. DIAGNOSIS Your health care provider will perform a physical exam. During your exam, your health care provider  may perform any of the following to help determine if you have acute sinusitis or chronic sinusitis:  Look in your nose for signs of abnormal growths in your nostrils (nasal polyps).  Tap over the affected sinus to check for signs of infection.  View the inside of your sinuses using an imaging device that has a light attached (endoscope). If your health care provider suspects that you have chronic sinusitis, one or more of the following tests may be recommended:  Allergy tests.  Nasal culture. A sample of mucus is taken from your nose, sent to a lab, and screened for bacteria.  Nasal cytology. A sample of mucus is taken from your nose and examined by your health care provider to determine if your sinusitis is related to an allergy. TREATMENT Most cases of acute sinusitis are related to a viral infection and will resolve on their own within 10 days. Sometimes, medicines are prescribed to help relieve symptoms of both acute and chronic sinusitis. These may include pain medicines, decongestants, nasal steroid sprays, or saline sprays. However, for sinusitis related to a bacterial infection, your health care provider will prescribe antibiotic medicines. These are medicines that will help kill the bacteria causing the infection. Rarely, sinusitis is caused by a fungal infection. In these cases, your health care provider will prescribe antifungal medicine. For some cases of chronic sinusitis, surgery is needed. Generally, these are cases in which sinusitis recurs more than 3 times per year, despite other treatments. HOME CARE INSTRUCTIONS  Drink plenty of water. Water helps thin the mucus so your sinuses can drain more easily.  Use a humidifier.  Inhale steam 3-4 times a day (  for example, sit in the bathroom with the shower running).  Apply a warm, moist washcloth to your face 3-4 times a day, or as directed by your health care provider.  Use saline nasal sprays to help moisten and clean your  sinuses.  Take medicines only as directed by your health care provider.  If you were prescribed either an antibiotic or antifungal medicine, finish it all even if you start to feel better. SEEK IMMEDIATE MEDICAL CARE IF:  You have increasing pain or severe headaches.  You have nausea, vomiting, or drowsiness.  You have swelling around your face.  You have vision problems.  You have a stiff neck.  You have difficulty breathing.   This information is not intended to replace advice given to you by your health care provider. Make sure you discuss any questions you have with your health care provider.   Document Released: 02/24/2005 Document Revised: 03/17/2014 Document Reviewed: 03/11/2011 Elsevier Interactive Patient Education 2016 Else[employer] Inc[employer]  Benign Positional Vertigo Vertigo is the feeling that you or your surroundings are moving when they are not. Benign positional vertigo is the most common form of vertigo. The cause of this condition is not serious (is benign). This condition is triggered by certain movements and positions (is positional). This condition can be dangerous if it occurs while you are doing something that could endanger you or others, such as driving.  CAUSES In many cases, the cause of this condition is not known. It may be caused by a disturbance in an area of the inner ear that helps your brain to sense movement and balance. This disturbance can be caused by a viral infection (labyrinthitis), head injury, or repetitive motion. RISK FACTORS This condition is more likely to develop in:  Women.  People who are 28 years of age or older. SYMPTOMS Symptoms of this condition usually happen when you move your head or your eyes in different directions. Symptoms may start suddenly, and they usually last for less than a minute. Symptoms may include:  Loss of balance and falling.  Feeling like you are spinning or moving.  Feeling like your surroundings are spinning  or moving.  Nausea and vomiting.  Blurred vision.  Dizziness.  Involuntary eye movement (nystagmus). Symptoms can be mild and cause only slight annoyance, or they can be severe and interfere with daily life. Episodes of benign positional vertigo may return (recur) over time, and they may be triggered by certain movements. Symptoms may improve over time. DIAGNOSIS This condition is usually diagnosed by medical history and a physical exam of the head, neck, and ears. You may be referred to a health care provider who specializes in ear, nose, and throat (ENT) problems (otolaryngologist) or a provider who specializes in disorders of the nervous system (neurologist). You may have additional testing, including:  MRI.  A CT scan.  Eye movement tests. Your health care provider may ask you to change positions quickly while he or she watches you for symptoms of benign positional vertigo, such as nystagmus. Eye movement may be tested with an electronystagmogram (ENG), caloric stimulation, the Dix-Hallpike test, or the roll test.  An electroencephalogram (EEG). This records electrical activity in your brain.  Hearing tests. TREATMENT Usually, your health care provider will treat this by moving your head in specific positions to adjust your inner ear back to normal. Surgery may be needed in severe cases, but this is rare. In some cases, benign positional vertigo may resolve on its own in 2-4 weeks.  HOME CARE INSTRUCTIONS Safety  Move slowly.Avoid sudden body or head movements.  Avoid driving.  Avoid operating heavy machinery.  Avoid doing any tasks that would be dangerous to you or others if a vertigo episode would occur.  If you have trouble walking or keeping your balance, try using a cane for stability. If you feel dizzy or unstable, sit down right away.  Return to your normal activities as told by your health care provider. Ask your health care provider what activities are safe for  you. General Instructions  Take over-the-counter and prescription medicines only as told by your health care provider.  Avoid certain positions or movements as told by your health care provider.  Drink enough fluid to keep your urine clear or pale yellow.  Keep all follow-up visits as told by your health care provider. This is important. SEEK MEDICAL CARE IF:  You have a fever.  Your condition gets worse or you develop new symptoms.  Your family or friends notice any behavioral changes.  Your nausea or vomiting gets worse.  You have numbness or a "pins and needles" sensation. SEEK IMMEDIATE MEDICAL CARE IF:  You have difficulty speaking or moving.  You are always dizzy.  You faint.  You develop severe headaches.  You have weakness in your legs or arms.  You have changes in your hearing or vision.  You develop a stiff neck.  You develop sensitivity to light.   This information is not intended to replace advice given to you by your health care provider. Make sure you discuss any questions you have with your health care provider.   Document Released: 12/02/2005 Document Revised: 11/15/2014 Document Reviewed: 06/19/2014 Elsevier Interactive Patient Education [employer]  Seizure, Adult A seizure is abnormal electrical activity in the brain. Seizures usually last from 30 seconds to 2 minutes. There are various types of seizures. Before a seizure, you may have a warning sensation (aura) that a seizure is about to occur. An aura may include the following symptoms:   Fear or anxiety.  Nausea.  Feeling like the room is spinning (vertigo).  Vision changes, such as seeing flashing lights or spots. Common symptoms during a seizure include:  A change in attention or behavior (altered mental status).  Convulsions with rhythmic jerking movements.  Drooling.  Rapid eye movements.  Grunting.  Loss of bladder and bowel control.  Bitter taste in the  mouth.  Tongue biting. After a seizure, you may feel confused and sleepy. You may also have an injury resulting from convulsions during the seizure. HOME CARE INSTRUCTIONS   If you are given medicines, take them exactly as prescribed by your health care provider.  Keep all follow-up appointments as directed by your health care provider.  Do not swim or drive or engage in risky activity during which a seizure could cause further injury to you or others until your health care provider says it is OK.  Get adequate rest.  Teach friends and family what to do if you have a seizure. They should:  Lay you on the ground to prevent a fall.  Put a cushion under your head.  Loosen any tight clothing around your neck.  Turn you on your side. If vomiting occurs, this helps keep your airway clear.  Stay with you until you recover.  Know whether or not you need emergency care. SEEK IMMEDIATE MEDICAL CARE IF:  The seizure lasts longer than 5 minutes.  The seizure is severe or you do not  wake up immediately after the seizure.  You have an altered mental status after the seizure.  You are having more frequent or worsening seizures. Someone should drive you to the emergency department or call local emergency services (911 in [place] MAKE SURE YOU:  Understand these instructions.  Will watch your condition.  Will get help right away if you are not doing well or get worse.   This information is not intended to replace advice given to you by your health care provider. Make sure you discuss any questions you have with your health care provider.   Document Released: 02/22/2000 Document Revised: 03/17/2014 Document Reviewed: 10/06/2012 Elsevier Interactive Patient Education [employer]

## 2015-05-15 NOTE — ED Provider Notes (Signed)
TIME SEEN: 11:10 PM   CHIEF COMPLAINT:  Chief Complaint  Patient presents with  . Seizures  . Stroke Symptoms  . Numbness  . Headache   HPI: Peter Barr is a 30 y.o. male with a hx of seizure disorder on Keppra, who presents to the Emergency Department complaining of dizziness and sinus headache, with other associated sx due to difficulty recuperating from subjective seizure-like activity which occurred 2 nights ago. He states that he thinks he had a seizure because he woke up feeling sore all over which is normal for him. Pt also c/o of imbalance, frontal head pressure, sinus pressure, lower back pain and soreness with slight vision changes and difficulty walking. Reports his vision is back to baseline. Wife reports that normally he is "out for 24 hours" after a seizure because of soreness, vision changes, difficulty walking. He also c/o right sided facial numbness and pressure in the cheek and forehead which began after arrival to the emergency department.  no other numbness or focal weakness. Pt has a neurologist, Dr. Karel JarvisAquino with [facility], who is aware of his complaints. He was prescribed Flexeril  and ibuprofen for his back pain. States that his neurologist wanted him to starLamictal  Which he has not yet started.  Pt is currently taking keppra for his seizures - no missed doses. Pt denies any fever.  Patient reports he does get sinusitis frequently. He has had sinus pressure, sneezing. Complains of throbbing headache.    ROS: See HPI Constitutional: no fever  Eyes: no drainage  ENT: no runny nose   Cardiovascular:  no chest pain  Resp: no SOB  GI: no vomiting GU: no dysuria Integumentary: no rash  Allergy: no hives  Musculoskeletal: no leg swelling  Neurological: no slurred speech ROS otherwise negative  PAST MEDICAL HISTORY/PAST SURGICAL HISTORY:  Past Medical History  Diagnosis Date  . Hypertension   . Migraine   . Anxiety   . Seizures (HCC)     MEDICATIONS:   Prior to Admission medications   Medication Sig Start Date End Date Taking? Authorizing Provider  cyclobenzaprine (FLEXERIL) 5 MG tablet Take 1 tablet (5 mg total) by mouth every 8 (eight) hours as needed for muscle spasms. 05/04/15   Van ClinesKaren M [name], MD  LamoTRIgine (LAMICTAL XR) 25 MG TB24 tablet Take 1 tablet daily for 1 week, then increase to 2 tablets daily for 1 week, then increase to 4 tablets daily and continue. 05/14/15   Van ClinesKaren M [name], MD  levETIRAcetam (KEPPRA) 500 MG tablet Take 1 tablet twice a day 05/04/15   Van ClinesKaren M [name], MD  nortriptyline (PAMELOR) 10 MG capsule Take 2 caps at night for 1 week, then increase to 3 caps at night and continue 05/04/15   Van ClinesKaren M [name], MD  pantoprazole (PROTONIX) 40 MG tablet Take 1 tablet (40 mg total) by mouth daily. 04/16/14   Ladona MowJoe Mintz, PA-C  SUMAtriptan-naproxen (TREXIMET) 85-500 MG tablet Take 1 tablet by mouth as needed for migraine. 05/04/15   Van ClinesKaren M [name], MD  zolmitriptan (ZOMIG) 5 MG nasal solution Place 1 spray into the nose as needed for migraine. 05/04/15   Van ClinesKaren M [name], MD    ALLERGIES:  Allergies  Allergen Reactions  . Depakote Er [Divalproex Sodium Er] Other (See Comments)    Caused burns on arm    SOCIAL HISTORY:  Social History  Substance Use Topics  . Smoking status: Never Smoker   . Smokeless tobacco: Never Used  . Alcohol Use: Yes  FAMILY HISTORY: Family History  Problem Relation Age of Onset  . Hypertension Father   . Diabetes Maternal Grandmother   . Stroke Maternal Grandmother     EXAM: BP 126/87 mmHg  Pulse 88  Temp(Src) 99 F (37.2 C) (Oral)  Resp 18  Ht  (1.803 m)  Wt 265 lb (120.203 kg)  BMI 36.98 kg/m2  SpO2 99% CONSTITUTIONAL: Alert and oriented and responds appropriately to questions. Well-appearing; well-nourished, Afebrile and nontoxic HEAD: Normocephalic EYES: Conjunctivae clear, PERRL ENT: normal nose; no rhinorrhea; moist mucous membranes; No pharyngeal erythema or petechiae, no  tonsillar hypertrophy or exudate, no uvular deviation, no trismus or drooling, normal phonation, no stridor, no dental caries or abscess noted, no Ludwig's angina, tongue sits flat in the bottom of the mouth; to palpation over the right maxillary and frontal sinuses without erythema or warmth, no facial swelling noted  NECK: Supple, no meningismus, no LAD  CARD: RRR; S1 and S2 appreciated; no murmurs, no clicks, no rubs, no gallops RESP: Normal chest excursion without splinting or tachypnea; breath sounds clear and equal bilaterally; no wheezes, no rhonchi, no rales, no hypoxia or respiratory distress, speaking full sentences ABD/GI: Normal bowel sounds; non-distended; soft, non-tender, no rebound, no guarding, no peritoneal signs BACK:  The back appears normal and is non-tender to palpation, there is no CVA tenderness EXT: Normal ROM in all joints; non-tender to palpation; no edema; normal capillary refill; no cyanosis, no calf tenderness or swelling    SKIN: Normal color for age and race; warm; no rash NEURO: Moves all extremities equally, sensation to light touch intact diffusely, cranial nerves II through XII intact. Strength 5/5 in all 4 extremities. When pt stands he feels vertiginous but he is able to ambulate without assistance. PSYCH: The patient's mood and manner are appropriate. Grooming and personal hygiene are appropriate.  MEDICAL DECISION MAKING:Patient here with possible seizure that occurred 2 nights ago. States he woke up feeling sore in complaining of vision changes that he describes as a blurry vision that is now resolved, otherwise neurologic exam is normal. Labs unremarkable. CT shows no intracranial abnormality but he does have significant right-sided sinus disease which is contributing to his sinus pressure, facial numbness, vertiginous symptoms. Will discharge on amoxicillin and with Flonase and Zyrtec. Will give dose of Decadron and Toradol in the emergency room for symptom  relief. We'll discharge with meclizine for his vertigo. He does have outpatient PCP and neurology follow-up. I do not think he is having a stroke or intracranial hemorrhage. No signs of meningitis, encephalitis on exam. He is safe to be discharged with his wife. Discussed return precautions. They verbalize understanding and are comfortable with this plan.  Encouraged him to start the Lamictal that his neurologist prescribed for him yesterday.      EKG Interpretation  Date/Time:    Ventricular Rate:  106 PR Interval:  144 QRS Duration: 86 QT Interval:  330 QTC Calculation: 438 R Axis:   -18 Text Interpretation:  Sinus tachycardia Moderate voltage criteria for LVH, may be normal variant Possible Inferior infarct , age undetermined Abnormal ECG No significant change since last tracing Confirmed by WARD,  DO, KRISTEN [id number]) on 05/15/2015 11:42:52 PM         Layla Maw Ward, DO 05/15/15 [id number]

## 2015-05-16 ENCOUNTER — Encounter: Payer: Self-pay | Admitting: Neurology

## 2015-05-16 NOTE — ED Notes (Signed)
Pt called for a room pt did not answer nurse first aware

## 2015-05-16 NOTE — ED Notes (Signed)
Pt verbalized understanding of d/c instructions and has no further questions. Pt stable and NAD. Pt to follow up with pcp and neurologist.

## 2015-05-30 ENCOUNTER — Ambulatory Visit: Payer: Medicaid Other | Admitting: Neurology

## 2015-06-18 ENCOUNTER — Ambulatory Visit: Payer: BLUE CROSS/BLUE SHIELD | Admitting: Neurology

## 2015-06-21 ENCOUNTER — Encounter: Payer: Self-pay | Admitting: Neurology

## 2015-06-21 ENCOUNTER — Ambulatory Visit (INDEPENDENT_AMBULATORY_CARE_PROVIDER_SITE_OTHER): Payer: BLUE CROSS/BLUE SHIELD | Admitting: Neurology

## 2015-06-21 VITALS — BP 130/100 | HR 100 | Ht 71.0 in | Wt 279.0 lb

## 2015-06-21 DIAGNOSIS — G40009 Localization-related (focal) (partial) idiopathic epilepsy and epileptic syndromes with seizures of localized onset, not intractable, without status epilepticus: Secondary | ICD-10-CM | POA: Diagnosis not present

## 2015-06-21 DIAGNOSIS — G43009 Migraine without aura, not intractable, without status migrainosus: Secondary | ICD-10-CM

## 2015-06-21 MED ORDER — LEVETIRACETAM 500 MG PO TABS: ORAL_TABLET | ORAL | Status: DC

## 2015-06-21 MED ORDER — LAMOTRIGINE ER 100 MG PO TB24: ORAL_TABLET | ORAL | Status: DC

## 2015-06-21 NOTE — Progress Notes (Signed)
NEUROLOGY FOLLOW UP OFFICE NOTE  Bethany Cumming [id number]  HISTORY OF PRESENT ILLNESS: I had the pleasure of seeing Tayler Lassen in follow-up in the neurology clinic on 06/21/2015.  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 On his last visit, he denied any seizures since November 2016 on Keppra 500mg  BID, but felt that side effects were too much that he was regressing, exhausted all the time. He called our office to report a seizure on 05/13/15 and wanting to get back on Lamotrigine ER since he felt better on this previously and insurance was back in place. He was given an uptitration schedule for Lamotrigine and instructed to continue Keppra 500mg  BID. He is currently on Lamotrigine ER 25mg  4 tablets daily without side effects. He reports the seizure last month was witnessed by his wife, he started feeling weird then started shaking. He woke up on the bed with no recollection of how he got there, he had hit his nose. He denied any gustatory hallucination prior to the seizure. He continues to have headaches, but reports that he was instructed to stop nortriptyline when he went to the ER last month and was given antibiotics and a nasal spray. He continues to feel a burning pressure on the left side of his head, as well as feeling congested. No fever. He did not notice any change with headaches off the nortriptyline. He denies any nausea/vomiting, vision is a little blurred.   HPI: This is a 30 yo LH man with new onset seizures last 04/16/2013. He came home from a typical 13-hour 3rd shift, came home and recalls his wife fixing breakfast, then has no recollection of events until he was at [facility] ER. His wife reported that he was talking then suddenly tensed up and fell to his right side, with tonic-clonic activity repeatedly hitting his head on the child safety gate in their kitchen for 3-5 minutes. This was followed by sonorous  respirations. He was brought to [facility] ER where he had a second GTC after the CT head. He was given an IV load of Cerebryx, which apparently caused burning in the IV site. He was switched from Dilantin and discharged on Vimpat 100mg  BID. He had seen neurologist Dr. Terrace Arabia and reported continued to have back pain and paresthesias in both feet. MRI thoracic spine was unremarkable. He continues on meloxicam and still has the back pain. In March, he was in the ER for dizziness with sudden onset spinning sensation. He was diagnosed with vertigo and discharged home. He also reported fatigue, lack of appetite, poor balance.   He had a nocturnal convulsion in April 2015. In September 2015, he had a complex partial seizure with behavioral arrest, followed by a weird sound, "like a screech," then started moving his body back and forth, hands clenched at his sides. The episode lasted 30 seconds, followed by confusion for 2 minutes. He had no recollection of the episode and denied any warning symptoms. His wife expressed concern that he has been "strange" lately, almost daily, sometimes 1-2 times a day, he would gaze out, then ask her what she had said, lasting a minute or so. He doesn't realize he stopped talking, but she notes he is briefly confused after. He had a complex partial seizure on Christmas day 2015. He recalls feeling tired, then was unresponsive to his wife, who noticed his face was droopy, he was drooling, and had a weird taste in  his mouth when he came to. He recalls his head was spinning after. He was fine the next day.   He was at [facility] ER in July 2016 for a seizure. At that time he reported taking the Aptiom. He called our office on 11/29/14 to report a seizure and was scheduled for an urgent visit, but in the interim they started having housing issues and had to move temporarily to be with family in [place]. He reported seizures on 8/8 and 9/9, and was started on Keppra  BID in [facility]  in [place]. Seizures were preceded by gustatory hallucination of a weird taste. He reported that he stopped the Aptiom in June and was only taking nortriptyline and Keppra. His last call to our office was in October 2016, then he called back last week to see if there were any appointments to review disability. He did well seizure-free for 4 months on Keppra  BID, however he feels that he is regressing. 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 His energy level is low. His wife has noticed things when he is sleeping, he would be exhausted on waking up, still with continued headaches with head pounding 2-3 hours daily with photosensitivity, no nausea or vomiting, vision off even with his glasses, numbness on his right cheek. His wife also noticed a change in his speech and walking. He reports mood is "okay," he is just tired all the time. He reports olfactory hallucinations ("sour smell") once a week. His vision is so blurred that he needs help when he stays with the children. He reports falling often, his right cheek would go numb, then his right leg feels numb and he would fall.   Epilepsy Risk Factors: His father had febrile convulsions as a child. Otherwise he had a normal birth and early development. There is no history of febrile convulsions, CNS infections such as meningitis/encephalitis, significant traumatic brain injury, neurosurgical procedures, or family history of seizures.  Diagnostic Data: I personally reviewed MRI brain with and without contrast which was normal, hippocampi symmetric with no abnormal signal or enhancement seen. Routine EEG reported as normal. 24-hour EEG showed occasional focal left temporal slowing and left temporal epileptiform discharges in sleep. No typical events captured.  Prior AEDs: Dilantin, Vimpat,  Aptiom, Keppra  PAST MEDICAL HISTORY: Past Medical History  Diagnosis Date  . Hypertension   . Migraine   . Anxiety   . Seizures (HCC)     MEDICATIONS: Current Outpatient Prescriptions on File Prior to Visit  Medication Sig Dispense Refill  . Cetirizine HCl (ZYRTEC ALLERGY) 10 MG CAPS Take 1 capsule (10 mg total) by mouth daily. 30 capsule 1  . cyclobenzaprine (FLEXERIL) 5 MG tablet Take 1 tablet (5 mg total) by mouth every 8 (eight) hours as needed for muscle spasms. 30 tablet 0  . fluticasone (FLONASE) 50 MCG/ACT nasal spray Place 2 sprays into both nostrils daily. 16 g 1  . LamoTRIgine (LAMICTAL XR) 25 MG TB24 tablet Take 1 tablet daily for 1 week, then increase to 2 tablets daily for 1 week, then increase to 4 tablets daily and continue. (Patient taking differently: Take 4 tablets daily) 120 tablet 3  . levETIRAcetam (KEPPRA) 500 MG tablet Take 1 tablet twice a day 60 tablet 5  . meclizine (ANTIVERT) 25 MG tablet Take  1-2 tablets (25-50 mg total) by mouth 3 (three) times daily as needed for dizziness. 30 tablet 0  . nortriptyline (PAMELOR) 10 MG capsule Take 2 caps at night for 1 week, then increase to 3 caps at night and continue (Patient not taking: Reported on 06/21/2015) 90 capsule 6   No current facility-administered medications on file prior to visit.    ALLERGIES: Allergies  Allergen Reactions  . Depakote Er [Divalproex Sodium Er] Other (See Comments)    Caused burns on arm    FAMILY HISTORY: Family History  Problem Relation Age of Onset  . Hypertension Father   . Diabetes Maternal Grandmother   . Stroke Maternal Grandmother     SOCIAL HISTORY: Social History   Social History  . Marital Status: Married    Spouse Name: [name]  . Number of Children: 3  . Years of Education: college   Occupational History  .  [employer]   Social History Main Topics  . Smoking status: Never Smoker   . Smokeless tobacco: Never Used  . Alcohol Use: Yes  . Drug Use: No   . Sexual Activity: Not on file   Other Topics Concern  . Not on file   Social History Narrative   Patient lives at home with his wife [name]) Patient works Full time out of work on leave.    Education some college   Left handed   Caffeine None    REVIEW OF SYSTEMS: Constitutional: No fevers, chills, or sweats, + generalized fatigue, change in appetite Eyes: No visual changes, double vision, eye pain Ear, nose and throat: No hearing loss, ear pain, nasal congestion, sore throat Cardiovascular: No chest pain, palpitations Respiratory:  No shortness of breath at rest or with exertion, wheezes GastrointestinaI: No nausea, vomiting, diarrhea, abdominal pain, fecal incontinence Genitourinary:  No dysuria, urinary retention or frequency Musculoskeletal:  + neck pain, back pain Integumentary: No rash, pruritus, skin lesions Neurological: as above Psychiatric: No depression, insomnia, anxiety Endocrine: No palpitations, fatigue, diaphoresis, mood swings, change in appetite, change in weight, increased thirst Hematologic/Lymphatic:  No anemia, purpura, petechiae. Allergic/Immunologic: no itchy/runny eyes, nasal congestion, recent allergic reactions, rashes  PHYSICAL EXAM: Filed Vitals:   06/21/15 0940  BP: 130/100  Pulse: 100   General: No acute distress Head:  Normocephalic/atraumatic Neck: supple, no paraspinal tenderness, full range of motion Heart:  Regular rate and rhythm Lungs:  Clear to auscultation bilaterally Back: No paraspinal tenderness Skin/Extremities: No rash, no edema Neurological Exam: alert and oriented to person, place, and time. No aphasia or dysarthria. Attention and concentration are impaired. Able to name objects and repeat phrases. Cranial nerves: Pupils equal, round, reactive to light. Extraocular movements intact with no nystagmus. Visual fields full. Facial sensation intact. No facial asymmetry. Tongue, uvula, palate midline.  Motor: Bulk and tone normal,  muscle strength 5/5 throughout with no pronator drift.  Sensation to light touch intact.  No extinction to double simultaneous stimulation.  Deep tendon reflexes 2+ throughout, toes downgoing.  Finger to nose testing intact.  Gait not tested.  IMPRESSION: This is a 30 yo LH man with a new onset generalized convulsions last February 2015 when he had 2 convulsions in one day. He has had several seizures since then, 24-hour EEG showed left temporal epileptiform discharges, MRI brain normal. 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 His last seizure was 05/13/15 on Keppra  BID. He was having side effects on Keppra and wanted to go back  on Lamotrigine ER now that insurance issues are resolved. He is back to taking Lamotrigine ER, currently on 100mg /day. He will increase dose to 200mg /day. Once he is on this dose, he will start tapering off Keppra. Dose of Lamotrigine could potentially be increased further if he has breakthrough seizures, versus addition of another AED such as Breviracetam or Zonisamide. He stopped nortriptyline for headaches, with no change in headaches, he reports congestion which may be the cause of headaches. He will speak to his PCP. We again discussed [place] driving laws that indicate one should not drive until 6 months seizure-free. He will follow-up in 5 months and knows to call for any problems.  Thank you for allowing me to participate in his care.  Please do not hesitate to call for any questions or concerns.  The duration of this appointment visit was 25 minutes of face-to-face time with the patient.  Greater than 50% of this time was spent in counseling, explanation of diagnosis, planning of further management, and coordination of care.   Patrcia DollyKaren [name], M.D.   CC: Dr. Clovis RileyMitchell

## 2015-06-21 NOTE — Patient Instructions (Signed)
1. Finish off your current bottle of Lamotrigine ER 25mg  4 tablets daily. Once you are done, your new prescription will be for Lamotrigine ER 100mg : Take 2 tablets daily. 2. Continue Keppra 500mg  1 tablet twice a day. A week after starting new Lamotrigine bottle, start reducing Keppra: Take 1/2 tablet twice a day for 1 week, then reduce to 1/2 tablet at night for a week, then stop. 3. Talk to your PCP about the sinus congestion 4. As per  driving laws, no driving until 6 months seizure-free  Seizure Precautions: 1. If medication has been prescribed for you to prevent seizures, take it exactly as directed.  Do not stop taking the medicine without talking to your doctor first, even if you have not had a seizure in a long time.   2. Avoid activities in which a seizure would cause danger to yourself or to others.  Don't operate dangerous machinery, swim alone, or climb in high or dangerous places, such as on ladders, roofs, or girders.  Do not drive unless your doctor says you may.  3. If you have any warning that you may have a seizure, lay down in a safe place where you can't hurt yourself.    4.  No driving for 6 months from last seizure, [place].   Please refer to the following link on the Epilepsy Foundation of America's website for more information: http://www.epilepsyfoundation.org/answerplace/Social/driving/drivingu.cfm   5.  Maintain good sleep hygiene. Avoid alcohol.  6.  Contact your doctor if you have any problems that may be related to the medicine you are taking.  7.  Call 911 and bring the patient back to the ED if:        A.  The seizure lasts longer than 5 minutes.       B.  The patient doesn't awaken shortly after the seizure  C.  The patient has new problems such as difficulty seeing, speaking or moving  D.  The patient was injured during the seizure  E.  The patient has a temperature over 102 F (39C)  F.  The patient vomited and now is having trouble  breathing

## 2015-07-30 ENCOUNTER — Other Ambulatory Visit: Payer: Self-pay

## 2015-07-30 DIAGNOSIS — G40009 Localization-related (focal) (partial) idiopathic epilepsy and epileptic syndromes with seizures of localized onset, not intractable, without status epilepticus: Secondary | ICD-10-CM

## 2015-07-30 MED ORDER — LAMOTRIGINE ER 100 MG PO TB24
ORAL_TABLET | ORAL | Status: DC
Start: 2015-07-30 — End: 2015-08-07

## 2015-07-31 NOTE — Telephone Encounter (Signed)
Medication required P.A. P.A. Approved 07/31/15- 07/30/16 PA# [id number]

## 2015-08-07 ENCOUNTER — Other Ambulatory Visit: Payer: Self-pay | Admitting: Family Medicine

## 2015-08-07 DIAGNOSIS — G40009 Localization-related (focal) (partial) idiopathic epilepsy and epileptic syndromes with seizures of localized onset, not intractable, without status epilepticus: Secondary | ICD-10-CM

## 2015-08-07 MED ORDER — LAMOTRIGINE ER 100 MG PO TB24: ORAL_TABLET | ORAL | Status: DC

## 2015-08-22 ENCOUNTER — Telehealth: Payer: Self-pay | Admitting: Neurology

## 2015-08-22 NOTE — Telephone Encounter (Signed)
Advised Mrs. Peter Barr that another provider in our office cannot write a letter on pt's behalf as they have never seen them. And unfortunately due to circumstances we have no openings until after Dr. Karel JarvisAquino returns from leave. Did advise that I would print and fax office notes, and advised to reach out to PCP for letter, as he/she would have a personal history with patient and be able to review and understand Neurology notes. Notes were printed and faxed to Mrs. Peter PatienceWilliams- Barr @ [phone]

## 2015-08-22 NOTE — Telephone Encounter (Signed)
Peter Barr 02/12/1986. Mrs. Peter Barr was following up on [name]. He is a patient of Dr. Karel JarvisAquino. She said she had faxed over a consent to our office. She said she is needing a response to the paperwork. She is aware that Dr. Karel JarvisAquino is out, but she would like to see if another Doctor could fill it out. Her number is [phone]. Thank you

## 2015-10-08 ENCOUNTER — Telehealth: Payer: Self-pay | Admitting: Neurology

## 2015-10-08 NOTE — Telephone Encounter (Signed)
Thanks, would move up his appt, maybe open one of my work-in new pt slots? Thanks!

## 2015-10-08 NOTE — Telephone Encounter (Signed)
Left message on machine for patient to call back.

## 2015-10-08 NOTE — Telephone Encounter (Signed)
Received note from after hours line that patient called to inform us he had a seizure yesterday. He declined triage. FYI.

## 2015-10-10 ENCOUNTER — Telehealth: Payer: Self-pay | Admitting: Neurology

## 2015-10-10 NOTE — Telephone Encounter (Signed)
Peter Barr Aug 04, 1985. There have been changes with his seizures and disability. He said he had a really bad seizure on Sunday 10/14/15. He would like you to call him regarding this. His # is [phone]. Thank you

## 2015-10-10 NOTE — Telephone Encounter (Signed)
Left message on machine for patient to call back. Made him aware I had left a message for him to call the other day because we got note from after hours clinic about this. He needs sooner appt with Dr. Karel Jarvis. He can make this appt with anyone. Thanks.

## 2015-10-10 NOTE — Telephone Encounter (Signed)
Patient was transferred to my voicemail to make an appt. Peter Barr- can you please call patient back to schedule appt? I feel like we will just keep leaving messages for each other.   Thanks so much!

## 2015-10-15 ENCOUNTER — Encounter: Payer: Self-pay | Admitting: Neurology

## 2015-10-15 ENCOUNTER — Ambulatory Visit (INDEPENDENT_AMBULATORY_CARE_PROVIDER_SITE_OTHER): Payer: Self-pay | Admitting: Neurology

## 2015-10-15 VITALS — BP 120/82 | HR 89 | Ht 71.0 in | Wt 284.0 lb

## 2015-10-15 DIAGNOSIS — G40019 Localization-related (focal) (partial) idiopathic epilepsy and epileptic syndromes with seizures of localized onset, intractable, without status epilepticus: Secondary | ICD-10-CM

## 2015-10-15 MED ORDER — LAMOTRIGINE 200 MG PO TABS: 200.0000 mg | ORAL_TABLET | Freq: Every day | ORAL | 1 refills | Status: DC

## 2015-10-15 NOTE — Patient Instructions (Signed)
1. Increase Lamotrigine 200mg : Take 1/2 tablet in AM, 1 tablet in PM 2. Let us know once insurance in place and we will send referral to [facility] 3. Follow-up in 3 months, call for any changes  Seizure Precautions: 1. If medication has been prescribed for you to prevent seizures, take it exactly as directed.  Do not stop taking the medicine without talking to your doctor first, even if you have not had a seizure in a long time.   2. Avoid activities in which a seizure would cause danger to yourself or to others.  Don't operate dangerous machinery, swim alone, or climb in high or dangerous places, such as on ladders, roofs, or girders.  Do not drive unless your doctor says you may.  3. If you have any warning that you may have a seizure, lay down in a safe place where you can't hurt yourself.    4.  No driving for 6 months from last seizure, as per [place].   Please refer to the following link on the Epilepsy Foundation of America's website for more information: http://www.epilepsyfoundation.org/answerplace/Social/driving/drivingu.cfm   5.  Maintain good sleep hygiene. Avoid alcohol.  6.  Contact your doctor if you have any problems that may be related to the medicine you are taking.  7.  Call 911 and bring the patient back to the ED if:        A.  The seizure lasts longer than 5 minutes.       B.  The patient doesn't awaken shortly after the seizure  C.  The patient has new problems such as difficulty seeing, speaking or moving  D.  The patient was injured during the seizure  E.  The patient has a temperature over 102 F (39C)  F.  The patient vomited and now is having trouble breathing

## 2015-10-15 NOTE — Progress Notes (Signed)
NEUROLOGY FOLLOW UP OFFICE NOTE  [facility] List [id number]  HISTORY OF PRESENT ILLNESS: I had the pleasure of seeing Peter Barr in follow-up in the neurology clinic on 10/15/2015. 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 He is again accompanied by his wife who helps supplement the history today. Since his last visit, his wife reports 1-2 focal seizures with impaired awareness a month, he would have a blank stare and become very confused, sometimes with lip smacking. The last one was at the beginning of this month. He had a convulsion last 10/08/15, he was telling his wife he was more dizzy than usual, holding his head on the right side, then had lip smacking followed by vocalization and a GTC with head turn to the right lasting 30 seconds. His wife noticed that he was more tired and run down the week prior to the seizure. He has good and bad days. He has occasional dizziness. His wife noticed he is snoring more now. His Lamotrigine dose was increased to Lamotrigine ER 200mg  daily, however due to insurance issues, he is now starting Lamotrigine immediate-release.   HPI: This is a 30 yo LH man with new onset seizures last 04/16/2013. He came home from a typical 13-hour 3rd shift, came home and recalls his wife fixing breakfast, then has no recollection of events until he was at [facility] ER. His wife reported that he was talking then suddenly tensed up and fell to his right side, with tonic-clonic activity repeatedly hitting his head on the child safety gate in their kitchen for 3-5 minutes. This was followed by sonorous respirations. He was brought to [facility] ER where he had a second GTC after the CT head. He was given an IV load of Cerebryx, which apparently caused burning in the IV site. He was switched from Dilantin and discharged on Vimpat 100mg  BID. He had seen neurologist Dr. Terrace Barr and reported continued to have back pain and  paresthesias in both feet. MRI thoracic spine was unremarkable. He continues on meloxicam and still has the back pain. In March, he was in the ER for dizziness with sudden onset spinning sensation. He was diagnosed with vertigo and discharged home. He also reported fatigue, lack of appetite, poor balance.   He had a nocturnal convulsion in April 2015. In September 2015, he had a complex partial seizure with behavioral arrest, followed by a weird sound, "like a screech," then started moving his body back and forth, hands clenched at his sides. The episode lasted 30 seconds, followed by confusion for 2 minutes. He had no recollection of the episode and denied any warning symptoms. His wife expressed concern that he has been "strange" lately, almost daily, sometimes 1-2 times a day, he would gaze out, then ask her what she had said, lasting a minute or so. He doesn't realize he stopped talking, but she notes he is briefly confused after. He had a complex partial seizure on Christmas day 2015. He recalls feeling tired, then was unresponsive to his wife, who noticed his face was droopy, he was drooling, and had a weird taste in his mouth when he came to. He recalls his head was spinning after. He was fine the next day.   He was at [facility] ER in July 2016 for a seizure. At that time he reported taking the Aptiom. He called our office on 11/29/14 to report a seizure and was scheduled for an urgent visit,  but in the interim they started having housing issues and had to move temporarily to be with family in [place]. He reported seizures on 8/8 and 9/9, and was started on Keppra  BID in [facility] in [place]. Seizures were preceded by gustatory hallucination of a weird taste. He reported that he stopped the Aptiom in June and was only taking nortriptyline and Keppra. His last call to our office was in October 2016, then he called back last week to see if there were any appointments to review disability.  He did well seizure-free for 4 months on Keppra  BID, however he feels that he is regressing. 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 His energy level is low. His wife has noticed things when he is sleeping, he would be exhausted on waking up, still with continued headaches with head pounding 2-3 hours daily with photosensitivity, no nausea or vomiting, vision off even with his glasses, numbness on his right cheek. His wife also noticed a change in his speech and walking. He reports mood is "okay," he is just tired all the time. He reports olfactory hallucinations ("sour smell") once a week. His vision is so blurred that he needs help when he stays with the children. He reports falling often, his right cheek would go numb, then his right leg feels numb and he would fall.   Epilepsy Risk Factors: His father had febrile convulsions as a child. Otherwise he had a normal birth and early development. There is no history of febrile convulsions, CNS infections such as meningitis/encephalitis, significant traumatic brain injury, neurosurgical procedures, or family history of seizures.  Diagnostic Data: I personally reviewed MRI brain with and without contrast which was normal, hippocampi symmetric with no abnormal signal or enhancement seen. Routine EEG reported as normal. 24-hour EEG showed occasional focal left temporal slowing and left temporal epileptiform discharges in sleep. No typical events captured.  Prior AEDs: Dilantin, Vimpat, Aptiom, Keppra  PAST MEDICAL HISTORY: Past Medical History:  Diagnosis Date  . Anxiety   . Hypertension   . Migraine   . Seizures (HCC)     MEDICATIONS: Current Outpatient Prescriptions on File Prior to Visit  Medication Sig Dispense Refill  . Cetirizine HCl (ZYRTEC ALLERGY) 10 MG CAPS Take 1 capsule  (10 mg total) by mouth daily. 30 capsule 1  . cyclobenzaprine (FLEXERIL) 5 MG tablet Take 1 tablet (5 mg total) by mouth every 8 (eight) hours as needed for muscle spasms. 30 tablet 0  . fluticasone (FLONASE) 50 MCG/ACT nasal spray Place 2 sprays into both nostrils daily. 16 g 1  . LamoTRIgine 100 MG TB24 Take 2 tablets daily 60 tablet 2  .      . meclizine (ANTIVERT) 25 MG tablet Take 1-2 tablets (25-50 mg total) by mouth 3 (three) times daily as needed for dizziness. 30 tablet 0  . nortriptyline (PAMELOR) 10 MG capsule Take 2 caps at night for 1 week, then increase to 3 caps at night and continue (Patient not taking: Reported on 06/21/2015) 90 capsule 6   No current facility-administered medications on file prior to visit.     ALLERGIES: Allergies  Allergen Reactions  . Depakote Er [Divalproex Sodium Er] Other (See Comments)    Caused burns on arm    FAMILY HISTORY: Family History  Problem  Relation Age of Onset  . Hypertension Father   . Diabetes Maternal Grandmother   . Stroke Maternal Grandmother     SOCIAL HISTORY: Social History   Social History  . Marital status: Married    Spouse name: [name]  . Number of children: 3  . Years of education: college   Occup[place]  [employer]   Social History Main Topics  . Smoking status: Never Smoker  . Smokeless tobacco: Never Used  . Alcohol use Yes  . Drug use: No  . Sexual activity: Not on file   Other Topics Concern  . Not on file   Social History Narrative   Patient lives at home with his wife [name]([name]) Patient works Full time out of work on leave.    Education some college   Left handed   Caffeine None    REVIEW OF SYSTEMS: Constitutional: No fevers, chills, or sweats, + generalized fatigue, change in appetite Eyes: No visual changes, double vision, eye pain Ear, nose and throat: No hearing loss, ear pain, nasal congestion, sore throat Cardiovascular: No chest pain, palpitations Respiratory:   No shortness of breath at rest or with exertion, wheezes GastrointestinaI: No nausea, vomiting, diarrhea, abdominal pain, fecal incontinence Genitourinary:  No dysuria, urinary retention or frequency Musculoskeletal:  + neck pain, back pain Integumentary: No rash, pruritus, skin lesions Neurological: as above Psychiatric: No depression, insomnia, anxiety Endocrine: No palpitations, fatigue, diaphoresis, mood swings, change in appetite, change in weight, increased thirst Hematologic/Lymphatic:  No anemia, purpura, petechiae. Allergic/Immunologic: no itchy/runny eyes, nasal congestion, recent allergic reactions, rashes  PHYSICAL EXAM: Vitals:   10/15/15 1411  BP: 120/82  Pulse: 89   General: No acute distress Head:  Normocephalic/atraumatic Neck: supple, no paraspinal tenderness, full range of motion Heart:  Regular rate and rhythm Lungs:  Clear to auscultation bilaterally Back: No paraspinal tenderness Skin/Extremities: No rash, no edema Neurological Exam: alert and oriented to person, place, and time. No aphasia or dysarthria. Attention and concentration are impaired. Able to name objects and repeat phrases. Cranial nerves: Pupils equal, round, reactive to light. Extraocular movements intact with no nystagmus. Visual fields full. Facial sensation intact. No facial asymmetry. Tongue, uvula, palate midline.  Motor: Bulk and tone normal, muscle strength 5/5 throughout with no pronator drift.  Sensation to light touch intact.  No extinction to double simultaneous stimulation.  Deep tendon reflexes 2+ throughout, toes downgoing.  Finger to nose testing intact.  Gait narrow-based and steady, no ataxia.  IMPRESSION: This is a 30 yo LH man with a new onset generalized convulsions last February 2015 when he had 2 convulsions in one day. His 24-hour EEG showed left temporal epileptiform discharges, MRI brain normal. Seizures likely focal to bilateral tonic-clonic epilepsy arising from the left  temporal lobe. He continues to have focal seizures with impaired awareness 1-2 times a month, and had a GTC last 10/08/15. Lamotrigine will be increased to 100mg  in AM, 200mg  in PM. We discussed that he may need to be on 2 AEDs. We also discussed intractable epilepsy and epilepsy surgery, we will proceed with referral to [facility] once insurance is in place. He is unable to work at this time due to seizure frequency. We again discussed [place] driving laws that indicate one should not drive until 6 months seizure-free. He will follow-up in 3 months and knows to call for any problems.  Thank you for allowing me to participate in his care.  Please do not hesitate to call for any  questions or concerns.  The duration of this appointment visit was 25 minutes of face-to-face time with the patient.  Greater than 50% of this time was spent in counseling, explanation of diagnosis, planning of further management, and coordination of care.   Patrcia Dolly, M.D.   CC: Dr. [name]

## 2015-10-17 ENCOUNTER — Telehealth: Payer: Self-pay | Admitting: Neurology

## 2015-10-17 ENCOUNTER — Encounter: Payer: Self-pay | Admitting: Neurology

## 2015-10-17 NOTE — Telephone Encounter (Signed)
Appointment Request From: [name]    With Provider: Van ClinesAquino,[name], MD [facility]]    Preferred Date Range: Any date 10/17/2015 or later    Preferred Times: Any    Reason: To address the following health maintenance concerns.  Tetanus/Tdap  Influenza Vaccine    Comments:   Please advise he sent this in to us does he need to be seen or a phone ok

## 2015-10-17 NOTE — Telephone Encounter (Signed)
PT called and needs a call back/Dawn CB#[phone]

## 2015-10-18 ENCOUNTER — Emergency Department (HOSPITAL_COMMUNITY): Payer: Medicaid Other

## 2015-10-18 ENCOUNTER — Encounter (HOSPITAL_COMMUNITY): Payer: Self-pay | Admitting: Emergency Medicine

## 2015-10-18 DIAGNOSIS — Y929 Unspecified place or not applicable: Secondary | ICD-10-CM | POA: Insufficient documentation

## 2015-10-18 DIAGNOSIS — M791 Myalgia: Secondary | ICD-10-CM | POA: Diagnosis not present

## 2015-10-18 DIAGNOSIS — R0789 Other chest pain: Secondary | ICD-10-CM | POA: Diagnosis present

## 2015-10-18 DIAGNOSIS — Y999 Unspecified external cause status: Secondary | ICD-10-CM | POA: Insufficient documentation

## 2015-10-18 DIAGNOSIS — W228XXA Striking against or struck by other objects, initial encounter: Secondary | ICD-10-CM | POA: Insufficient documentation

## 2015-10-18 DIAGNOSIS — I10 Essential (primary) hypertension: Secondary | ICD-10-CM | POA: Diagnosis not present

## 2015-10-18 DIAGNOSIS — Y939 Activity, unspecified: Secondary | ICD-10-CM | POA: Insufficient documentation

## 2015-10-18 LAB — BASIC METABOLIC PANEL
Anion gap: 6 (ref 5–15)
BUN: 10 mg/dL (ref 6–20)
CHLORIDE: 103 mmol/L (ref 101–111)
CO2: 26 mmol/L (ref 22–32)
CREATININE: 1.21 mg/dL (ref 0.61–1.24)
Calcium: 9.3 mg/dL (ref 8.9–10.3)
GFR calc Af Amer: >60 mL/min (ref >60)
GFR calc non Af Amer: >60 mL/min (ref >60)
Glucose, Bld: 142 mg/dL — ABNORMAL HIGH (ref 65–99)
Potassium: 3.9 mmol/L (ref 3.5–5.1)
SODIUM: 135 mmol/L (ref 135–145)

## 2015-10-18 LAB — CBC
HCT: 46 % (ref 39.0–52.0)
Hemoglobin: 15.3 g/dL (ref 13.0–17.0)
MCH: 27.2 pg (ref 26.0–34.0)
MCHC: 33.3 g/dL (ref 30.0–36.0)
MCV: 81.9 fL (ref 78.0–100.0)
PLATELETS: 224 10*3/uL (ref 150–400)
RBC: 5.62 MIL/uL (ref 4.22–5.81)
RDW: 13.2 % (ref 11.5–15.5)
WBC: 12.6 10*3/uL — AB (ref 4.0–10.5)

## 2015-10-18 LAB — I-STAT TROPONIN, ED: Troponin i, poc: 0 ng/mL (ref 0.00–0.08)

## 2015-10-18 NOTE — Telephone Encounter (Signed)
Message sent by patient routed to Dr. Karel JarvisAquino

## 2015-10-18 NOTE — ED Triage Notes (Signed)
Pt. reports central chest pain radiating to right arm with mild SOB and dry cough onset today , denies emesis or diaphoresis . Pt. Added lightheaded/diziness today / lost his balance and fell today with pain at entire back . Alert and oriented at arrival/ambulatory.

## 2015-10-19 ENCOUNTER — Emergency Department (HOSPITAL_COMMUNITY)
Admission: EM | Admit: 2015-10-19 | Discharge: 2015-10-19 | Disposition: A | Discharge status: Home / Self Care | Payer: Medicaid Other | Attending: Emergency Medicine | Admitting: Emergency Medicine

## 2015-10-19 DIAGNOSIS — W19XXXA Unspecified fall, initial encounter: Secondary | ICD-10-CM

## 2015-10-19 DIAGNOSIS — R079 Chest pain, unspecified: Secondary | ICD-10-CM

## 2015-10-19 DIAGNOSIS — M791 Myalgia, unspecified site: Secondary | ICD-10-CM

## 2015-10-19 LAB — URINALYSIS, ROUTINE W REFLEX MICROSCOPIC
Bilirubin Urine: NEGATIVE
Glucose, UA: NEGATIVE mg/dL
HGB URINE DIPSTICK: NEGATIVE
Ketones, ur: NEGATIVE mg/dL
Leukocytes, UA: NEGATIVE
Nitrite: NEGATIVE
Protein, ur: NEGATIVE mg/dL
SPECIFIC GRAVITY, URINE: 1.02 (ref 1.005–1.030)
pH: 5.5 (ref 5.0–8.0)

## 2015-10-19 LAB — CK: CK TOTAL: 134 U/L (ref 49–397)

## 2015-10-19 LAB — D-DIMER, QUANTITATIVE: D-Dimer, Quant: 0.36 ug/mL-FEU (ref 0.00–0.50)

## 2015-10-19 MED ORDER — KETOROLAC TROMETHAMINE 30 MG/ML IJ SOLN
15.0000 mg | Freq: Once | INTRAMUSCULAR | Status: AC
  Administered 2015-10-19: 15 mg via INTRAVENOUS
  Filled 2015-10-19: qty 1

## 2015-10-19 MED ORDER — FENTANYL CITRATE (PF) 100 MCG/2ML IJ SOLN
100.0000 ug | Freq: Once | INTRAMUSCULAR | Status: AC
  Administered 2015-10-19: 100 ug via INTRAVENOUS
  Filled 2015-10-19: qty 2

## 2015-10-19 MED ORDER — SODIUM CHLORIDE 0.9 % IV BOLUS (SEPSIS)
1000.0000 mL | Freq: Once | INTRAVENOUS | Status: AC
  Administered 2015-10-19: 1000 mL via INTRAVENOUS

## 2015-10-19 NOTE — Discharge Instructions (Signed)

## 2015-10-19 NOTE — ED Notes (Signed)
Pt attempted to ambulate in hall. When lifting up from bed pt stated he became very "dizzy". After giving pt some time to sit pt attempted to stand pt became very shaky and was only able to stand with assistance. Steady gait was not present in pt. Pt was unable to walk more than 2 steps. MD notified

## 2015-10-19 NOTE — ED Notes (Signed)
Pt c/o centralized chest pain "like something is tearing in my chest." Pt woke up after a nap this afternoon c/o chest pain and dizziness. When pt stood up, lost his balance and fell. Reports unable to bear weight on R leg, small abrasion noted to R knee. Pt appears generally weak when attempting to transfer from wheelchair to bed. Pt had a seizure two weeks ago, followed up with neurologist, and had Lamictal dose increased.

## 2015-10-19 NOTE — ED Provider Notes (Signed)
[facility]-EMERGENCY DEPT Provider Note   CSN: [id number] Arrival date & time: 10/18/15  2128  First Provider Contact:   First MD Initiated Contact with Patient 10/19/15 0203      By signing my name below, I, Peter Barr, attest that this documentation has been prepared under the direction and in the presence of Peter Rhine, MD. Electronically Signed: Soijett Barr, ED Scribe. 10/19/15. 2:22 AM.   History   Chief Complaint Chief Complaint  Patient presents with  . Chest Pain  . Fall    HPI Peter Barr is a 30 y.o. male with a medical hx of HTN, seizures, who presents to the Emergency Department complaining of central CP onset yesterday night. He notes that he woke up from his nap yesterday night when the CP and SOB began. He describes the CP as a tightness sensation and that "something is ripping at my lungs" with every breathe. Pt states that he is unable to lift his arms above his head and that he is unable to lift his left leg.    Pt reports that following the CP episode, the pt fell twice due to his legs "giving out on him" while trying to ambulate to the bathroom, striking his head and right shoulder. Pt states that he had a seizure several days ago and denies seizures today. Wife states that the pt current symptoms are typical following his seizures in the past and that the CP is a new symptoms. Wife denies seeing the pt having a seizure PTA, but notes that the pt has had a seizure in his sleep in the past and this may have occurred today  He states that he is having associated symptoms of back pain, HA, leg pain, SOB, chills, and dysuria. He has tried 500 mg tylenol for the relief for his symptoms. He denies cough, vomiting, fever, abdominal pain, and any other symptoms. Denies PMHx of DM, smoking cigarettes, or hx of blood clots. Pt denies immediate family with cardiac issues. Pt takes Lamictal for his seizures. Pt neurologist is Dr. Karel Jarvis at [place]. Denies ever having a stroke  or MI in the past.     The history is provided by the patient and the spouse. No language interpreter was used.  Chest Pain   This is a new problem. The current episode started 6 to 12 hours ago. The problem occurs rarely. The problem has not changed since onset.The pain is associated with rest. The pain is present in the substernal region. The pain is moderate. The quality of the pain is described as brief (tightness). The pain does not radiate. The symptoms are aggravated by deep breathing. Associated symptoms include headaches and shortness of breath. Pertinent negatives include no abdominal pain and no syncope. He has tried nothing for the symptoms. The treatment provided no relief.  His past medical history is significant for hypertension and seizures.  Fall  This is a new problem. The current episode started 6 to 12 hours ago. The problem occurs rarely. The problem has been resolved. Associated symptoms include chest pain, headaches and shortness of breath. Pertinent negatives include no abdominal pain. Nothing relieves the symptoms. He has tried nothing for the symptoms. The treatment provided no relief.    Past Medical History:  Diagnosis Date  . Anxiety   . Hypertension   . Migraine   . Seizures [facility])     Patient Active Problem List   Diagnosis Date Noted  . Localization-related idiopathic epilepsy and epileptic syndromes with seizures of  localized onset, intractable, without status epilepticus (HCC) 05/09/2015  . Migraine without aura and without status migrainosus, not intractable 05/09/2015  . Dizziness and giddiness 05/25/2013  . Gait disturbance 05/25/2013  . Paresthesia 04/29/2013  . Back pain 04/29/2013  . Hypertension   . Migraine   . Anxiety   . Seizure (HCC) 04/16/2013  . Seizures (HCC) 04/16/2013  . Dehydration 04/16/2013  . Sprain of ankle, unspecified site 04/23/2012    Past Surgical History:  Procedure Laterality Date  . KNEE SURGERY Right         Home Medications    Prior to Admission medications   Medication Sig Start Date End Date Taking? Authorizing Provider  Cetirizine HCl (ZYRTEC ALLERGY) 10 MG CAPS Take 1 capsule (10 mg total) by mouth daily. 05/15/15   Kristen N Ward, DO  cyclobenzaprine (FLEXERIL) 5 MG tablet Take 1 tablet (5 mg total) by mouth every 8 (eight) hours as needed for muscle spasms. 05/04/15   Van Clines, MD  fluticasone (FLONASE) 50 MCG/ACT nasal spray Place 2 sprays into both nostrils daily. 05/15/15   Kristen N Ward, DO  lamoTRIgine (LAMICTAL) 200 MG tablet Take 1 tablet (200 mg total) by mouth daily. 10/15/15   Van Clines, MD  meclizine (ANTIVERT) 25 MG tablet Take 1-2 tablets (25-50 mg total) by mouth 3 (three) times daily as needed for dizziness. 05/15/15   Kristen N Ward, DO  nortriptyline (PAMELOR) 10 MG capsule Take 2 caps at night for 1 week, then increase to 3 caps at night and continue 05/04/15   Van Clines, MD    Family History Family History  Problem Relation Age of Onset  . Hypertension Father   . Diabetes Maternal Grandmother   . Stroke Maternal Grandmother     Social History Social History  Substance Use Topics  . Smoking status: Never Smoker  . Smokeless tobacco: Never Used  . Alcohol use Yes     Comment: occasionally     Allergies   Depakote er [divalproex sodium er]   Review of Systems Review of Systems  Respiratory: Positive for shortness of breath.   Cardiovascular: Positive for chest pain. Negative for syncope.  Gastrointestinal: Negative for abdominal pain.  Neurological: Positive for seizures and headaches.  All other systems reviewed and are negative.    Physical Exam Updated Vital Signs BP 141/95   Pulse 98   Temp 99.5 F (37.5 C) (Oral)   Resp 21   SpO2 94%   Physical Exam  CONSTITUTIONAL: Well developed/well nourished HEAD: Normocephalic/atraumatic. Mild tenderness to posterior scalp.  EYES: EOMI/PERRL ENMT: Mucous membranes moist NECK:  supple no meningeal signs SPINE/BACK:mild diffuse tenderness to spine, No bruising/crepitance/stepoffs noted to spine CV: S1/S2 noted, no murmurs/rubs/gallops noted LUNGS: Lungs are clear to auscultation bilaterally, no apparent distress ABDOMEN: soft, nontender, no rebound or guarding, bowel sounds noted throughout abdomen GU:no cva tenderness NEURO: Pt is awake/alert/appropriate. No facial droop. No arm drift. Able to move right leg without difficulty. Pt has weakness noted to LLE with hip flexion EXTREMITIES: pulses normal/equal, full ROM. No deformities noted to extremities. Pelvis stable.  SKIN: warm, color normal PSYCH: no abnormalities of mood noted, alert and oriented to situation   ED Treatments / Results  DIAGNOSTIC STUDIES: Oxygen Saturation is 97% on RA, nl by my interpretation.    COORDINATION OF CARE: 2:20 AM Discussed treatment plan with pt at bedside which includes labs, EKG, CXR, and pt agreed to plan.   Labs (all labs ordered are  listed, but only abnormal results are displayed) Labs Reviewed  BASIC METABOLIC PANEL - Abnormal; Notable for the following:       Result Value   Glucose, Bld 142 (*)    All other components within normal limits  CBC - Abnormal; Notable for the following:    WBC 12.6 (*)    All other components within normal limits  URINALYSIS, ROUTINE W REFLEX MICROSCOPIC (NOT AT [facility])  CK  D-DIMER, QUANTITATIVE (NOT AT [facility])  Rosezena Sensor, ED    EKG  EKG Interpretation  Date/Time:  Thursday October 18 2015 21:30:02 EDT Ventricular Rate:  123 PR Interval:  144 QRS Duration: 80 QT Interval:  296 QTC Calculation: 423 R Axis:   -2 Text Interpretation:  Sinus tachycardia Otherwise normal ECG Confirmed by Bebe Shaggy  MD, Hanley Woerner ([id number]) on 10/19/2015 2:01:12 AM       Radiology Dg Chest 2 View  Result Date: 10/18/2015 CLINICAL DATA:  Seizure tonight with chest pain. EXAM: CHEST  2 VIEW COMPARISON:  04/16/2014 and 04/16/2013 radiographs  FINDINGS: The cardiomediastinal silhouette is unremarkable. Right hemidiaphragm elevation and right basilar scarring again noted. There is no evidence of focal airspace disease, pulmonary edema, suspicious pulmonary nodule/mass, pleural effusion, or pneumothorax. No acute bony abnormalities are identified. IMPRESSION: No evidence of acute cardiopulmonary disease. Electronically Signed   By: Harmon Pier M.D.   On: 10/18/2015 22:24    Procedures Procedures (including critical care time)  Medications Ordered in ED Medications  sodium chloride 0.9 % bolus 1,000 mL (not administered)  fentaNYL (SUBLIMAZE) injection 100 mcg (not administered)     Initial Impression / Assessment and Plan / ED Course  I have reviewed the triage vital signs and the nursing notes.  Pertinent labs & imaging results that were available during my care of the patient were reviewed by me and considered in my medical decision making (see chart for details).  Clinical Course    Pt with h/o seizures here with episode of CP and fall.  He denies full LOC.  He reports his "legs gave out" causing him to fall.  He has no visible signs of trauma, and he had GCS 15, defer CT head.  No indication for acute spinal imaging as my suspicion for spinal injury is low.  As for  CP - he reports the pain is worse with breathing.  D-dimer negative.  CXR negative.  EKG only shows sinus tachycardia.  I doubt ACS/PE at this time.  My suspicion for dissection is low as CXR does not reveal widened mediastinum, he had equal pulses in all extremities Pt did reports weakness initially, however after IV fluids and pain meds, this improved On my repeat evaluation (this was after attempt to ambulate at 04:55) pt had improved strength in all 4 extremities.  He could easily move his arms above his head. And he could fully lift his left leg off of bed and flex left knee.  No unilateral weakness noted.  He appeared comfortable, well appearing, talkative and  did not report any severe CP Wife reports that his symptoms of generalized weakness/pain are similar to prior episodes of seizure, so she suspects he may have had one while sleeping Will d/c home Advised rest, increase fluids We discussed return precautions    Final Clinical Impressions(s) / ED Diagnoses   Final diagnoses:  Chest pain, unspecified chest pain type  Myalgia  Fall, initial encounter    New Prescriptions New Prescriptions   No medications on file  I personally performed the services described in this documentation, which was scribed in my presence. The recorded information has been reviewed and is accurate.        Peter Rhineonald Aubreyana Saltz, MD 10/19/15 [phone]

## 2015-10-22 ENCOUNTER — Telehealth: Payer: Self-pay | Admitting: *Deleted

## 2015-10-22 NOTE — Telephone Encounter (Signed)
Thinks he had a seizure Sunday before last. Legs gave out, pressure from chest to his head. Just trying to walk today, hard to keep his balance. Also has neck and back pain. Both arms and legs were weak, the back of his head is very tender. Would do a head CT without contrast, would not change lamictal dose for now since we only increased it last week.  Raynelle FanningJulie, can you pls order a head CT without contrast to be done within the next day or so, dx: fall with head injury. Thanks!

## 2015-10-22 NOTE — Telephone Encounter (Signed)
Patient called today stating that on 10/16/15 he fell in his home and hit his head on his bed, since then patient states that he has been experiencing SOB, pain in the back of his head/tender to the touch, left side of his body from head through his leg is numb and sore.  Patient was seen in the ED on 10/19/15.  Patient is asking for an appt with you as soon as possible.

## 2015-10-23 ENCOUNTER — Other Ambulatory Visit: Payer: Self-pay | Admitting: Neurology

## 2015-10-23 ENCOUNTER — Ambulatory Visit (HOSPITAL_COMMUNITY)
Admission: RE | Admit: 2015-10-23 | Discharge: 2015-10-23 | Disposition: A | Discharge status: Home / Self Care | Payer: Medicaid Other | Source: Ambulatory Visit | Attending: Neurology | Admitting: Neurology

## 2015-10-23 ENCOUNTER — Emergency Department (HOSPITAL_COMMUNITY)
Admission: EM | Admit: 2015-10-23 | Discharge: 2015-10-23 | Disposition: A | Discharge status: Home / Self Care | Payer: Medicaid Other | Attending: Emergency Medicine | Admitting: Emergency Medicine

## 2015-10-23 ENCOUNTER — Other Ambulatory Visit: Payer: Self-pay | Admitting: *Deleted

## 2015-10-23 ENCOUNTER — Encounter (HOSPITAL_COMMUNITY): Payer: Self-pay

## 2015-10-23 ENCOUNTER — Other Ambulatory Visit: Payer: Self-pay

## 2015-10-23 ENCOUNTER — Emergency Department (HOSPITAL_COMMUNITY): Payer: Medicaid Other

## 2015-10-23 DIAGNOSIS — R42 Dizziness and giddiness: Secondary | ICD-10-CM | POA: Insufficient documentation

## 2015-10-23 DIAGNOSIS — J321 Chronic frontal sinusitis: Secondary | ICD-10-CM | POA: Diagnosis not present

## 2015-10-23 DIAGNOSIS — R569 Unspecified convulsions: Secondary | ICD-10-CM | POA: Diagnosis present

## 2015-10-23 DIAGNOSIS — S0990XA Unspecified injury of head, initial encounter: Secondary | ICD-10-CM | POA: Diagnosis present

## 2015-10-23 DIAGNOSIS — J0110 Acute frontal sinusitis, unspecified: Secondary | ICD-10-CM | POA: Diagnosis not present

## 2015-10-23 DIAGNOSIS — R072 Precordial pain: Secondary | ICD-10-CM | POA: Insufficient documentation

## 2015-10-23 DIAGNOSIS — W19XXXA Unspecified fall, initial encounter: Secondary | ICD-10-CM | POA: Diagnosis not present

## 2015-10-23 DIAGNOSIS — Z79899 Other long term (current) drug therapy: Secondary | ICD-10-CM | POA: Insufficient documentation

## 2015-10-23 DIAGNOSIS — I10 Essential (primary) hypertension: Secondary | ICD-10-CM | POA: Diagnosis not present

## 2015-10-23 LAB — URINALYSIS, ROUTINE W REFLEX MICROSCOPIC
BILIRUBIN URINE: NEGATIVE
GLUCOSE, UA: NEGATIVE mg/dL
Hgb urine dipstick: NEGATIVE
KETONES UR: NEGATIVE mg/dL
LEUKOCYTES UA: NEGATIVE
Nitrite: NEGATIVE
PH: 7.5 (ref 5.0–8.0)
Protein, ur: NEGATIVE mg/dL
Specific Gravity, Urine: 1.024 (ref 1.005–1.030)

## 2015-10-23 LAB — CBC WITH DIFFERENTIAL/PLATELET
Basophils Absolute: 0 10*3/uL (ref 0.0–0.1)
Basophils Relative: 0 %
EOS PCT: 1 %
Eosinophils Absolute: 0.1 10*3/uL (ref 0.0–0.7)
HCT: 41.6 % (ref 39.0–52.0)
Hemoglobin: 13.7 g/dL (ref 13.0–17.0)
LYMPHS ABS: 1.7 10*3/uL (ref 0.7–4.0)
Lymphocytes Relative: 17 %
MCH: 27 pg (ref 26.0–34.0)
MCHC: 32.9 g/dL (ref 30.0–36.0)
MCV: 82.1 fL (ref 78.0–100.0)
MONOS PCT: 12 %
Monocytes Absolute: 1.2 10*3/uL — ABNORMAL HIGH (ref 0.1–1.0)
Neutro Abs: 6.9 10*3/uL (ref 1.7–7.7)
Neutrophils Relative %: 70 %
PLATELETS: 253 10*3/uL (ref 150–400)
RBC: 5.07 MIL/uL (ref 4.22–5.81)
RDW: 13.2 % (ref 11.5–15.5)
WBC: 10 10*3/uL (ref 4.0–10.5)

## 2015-10-23 LAB — BASIC METABOLIC PANEL
Anion gap: 9 (ref 5–15)
BUN: 10 mg/dL (ref 6–20)
CALCIUM: 9.2 mg/dL (ref 8.9–10.3)
CO2: 24 mmol/L (ref 22–32)
CREATININE: 1.25 mg/dL — AB (ref 0.61–1.24)
Chloride: 102 mmol/L (ref 101–111)
GFR calc Af Amer: >60 mL/min (ref >60)
Glucose, Bld: 114 mg/dL — ABNORMAL HIGH (ref 65–99)
Potassium: 4.2 mmol/L (ref 3.5–5.1)
SODIUM: 135 mmol/L (ref 135–145)

## 2015-10-23 LAB — I-STAT TROPONIN, ED: TROPONIN I, POC: 0.01 ng/mL (ref 0.00–0.08)

## 2015-10-23 MED ORDER — SODIUM CHLORIDE 0.9 % IV BOLUS (SEPSIS)
1000.0000 mL | Freq: Once | INTRAVENOUS | Status: AC
  Administered 2015-10-23: 1000 mL via INTRAVENOUS

## 2015-10-23 MED ORDER — IBUPROFEN 800 MG PO TABS
800.0000 mg | ORAL_TABLET | Freq: Once | ORAL | Status: AC
  Administered 2015-10-23: 800 mg via ORAL
  Filled 2015-10-23: qty 1

## 2015-10-23 MED ORDER — AMOXICILLIN 500 MG PO CAPS: 500.0000 mg | ORAL_CAPSULE | Freq: Three times a day (TID) | ORAL | 0 refills | Status: DC

## 2015-10-23 MED ORDER — LAMOTRIGINE 100 MG PO TABS
200.0000 mg | ORAL_TABLET | Freq: Once | ORAL | Status: AC
  Administered 2015-10-23: 200 mg via ORAL
  Filled 2015-10-23: qty 2

## 2015-10-23 NOTE — Discharge Instructions (Signed)
As we discussed, your lab work is reassuring today. Your head CT was negative for any acute findings aside from sinusitis. Take the prescribed medication as directed. May continue taking Tylenol or Motrin as needed for headache. Continue your normal seizure medications as well. Follow-up with your neurologist. Return to the ED for new or worsening symptoms.

## 2015-10-23 NOTE — ED Triage Notes (Addendum)
Pt BIB EMS coming from home. Significant hx seizures, hit his head 1wk prior d/t seizure. Has been having headache, dizziness since, went for CT today. Returned home with incr'd dizziness. With EMS, had some twitching motion with mild confusion, which per wife happens shortly before seizures. New dose of lamictal beginning 1 wk ago. Due for next dose of lamictal @2030  today.

## 2015-10-23 NOTE — ED Notes (Signed)
Fayrene Helperyler Woods; Transporter, returned pt to room from x-ray.

## 2015-10-23 NOTE — Telephone Encounter (Signed)
Notified pt of CT appt for today.

## 2015-10-23 NOTE — Telephone Encounter (Signed)
CT head ordered and scheduled.  Appt is at [facility] at 1:00 today.  Left vm for pt to return our call.

## 2015-10-23 NOTE — ED Provider Notes (Signed)
[facility]-EMERGENCY DEPT Provider Note   CSN: [id number] Arrival date & time: 10/23/15  1947     History   Chief Complaint Chief Complaint  Patient presents with  . Seizures  . Dizziness    HPI Peter Barr is a 30 y.o. male.  The history is provided by the patient and medical records.  Seizures    Dizziness    30 year old male with history of anxiety, hypertension, migraine headaches, seizures, presenting to the ED for multiple complaints.  Seen in the ED for same complaints 4 days ago on 10/19/15 with negative work-up.  1. Dizziness--- Patient reports he fell 1 week ago during a seizure and struck the back of his head. He states since this time he has had some persistent dizziness which he describes as a "lightheadedness" mostly upon standing or moving.   States he was not feeling any better from his prior visit a few days ago so he called his neurologist to arrange for an outpatient CT which he had done this morning. There were findings of sinusitis, no other intracranial findings. States he has been complaint with his seizure medications. No seizures in the past 48 hours.  EMS reports some twitching motions, patient states this is typical for him.  Lamictal was recently increased to 100mg  in morning, 200mg  in evening.  Has not yet had his evening dose.  Denies headache, neck pain, confusion, focal numbness or weakness.  No hx of TIA or stroke.  States his neurologist is planning to try a few other medications to better control his symptoms, if no improvement he will eventually be referred to [facility] or [facility] for second opinion.  2.  Chest pain-- states has been ongoing since his ED visit 4 days ago, states chest feels "sore".  Also reports pain in his right arm without numbness or weakness.  No SOB, diaphoresis, nausea, or emesis.  No known cardiac hx.  No hx of DVT or PE.  States he has had a productive cough with yellow/green sputum.  He is concerned for "infection" in his chest.  Patient  is not a smoker.  3.  Facial pressure-- states pressure in his face.  Causing mild headache.  Denies photophobia or phonophobia.  No visual disturbance or confusion. No tinnitus, no changes in speech. States he also has a sensation of "something in his throat". He has not had any difficulty swallowing, eating, or drinking. No emesis or GERD symptoms.  Past Medical History:  Diagnosis Date  . Anxiety   . Hypertension   . Migraine   . Seizures [facility])     Patient Active Problem List   Diagnosis Date Noted  . Localization-related idiopathic epilepsy and epileptic syndromes with seizures of localized onset, intractable, without status epilepticus (HCC) 05/09/2015  . Migraine without aura and without status migrainosus, not intractable 05/09/2015  . Dizziness and giddiness 05/25/2013  . Gait disturbance 05/25/2013  . Paresthesia 04/29/2013  . Back pain 04/29/2013  . Hypertension   . Migraine   . Anxiety   . Seizure (HCC) 04/16/2013  . Seizures (HCC) 04/16/2013  . Dehydration 04/16/2013  . Sprain of ankle, unspecified site 04/23/2012    Past Surgical History:  Procedure Laterality Date  . KNEE SURGERY Right        Home Medications    Prior to Admission medications   Medication Sig Start Date End Date Taking? Authorizing Provider  acetaminophen (TYLENOL) 500 MG tablet Take 500 mg by mouth every 6 (six) hours as needed for mild  pain.    Historical Provider, MD  Cetirizine HCl (ZYRTEC ALLERGY) 10 MG CAPS Take 1 capsule (10 mg total) by mouth daily. 05/15/15   Kristen N Ward, DO  cyclobenzaprine (FLEXERIL) 5 MG tablet Take 1 tablet (5 mg total) by mouth every 8 (eight) hours as needed for muscle spasms. Patient not taking: Reported on 10/19/2015 05/04/15   Van Clines, MD  fluticasone [facility]) 50 MCG/ACT nasal spray Place 2 sprays into both nostrils daily. Patient not taking: Reported on 10/19/2015 05/15/15   Layla Maw Ward, DO  lamoTRIgine (LAMICTAL) 200 MG tablet Take 1 tablet (200  mg total) by mouth daily. 10/15/15   Van Clines, MD  meclizine (ANTIVERT) 25 MG tablet Take 1-2 tablets (25-50 mg total) by mouth 3 (three) times daily as needed for dizziness. Patient not taking: Reported on 10/19/2015 05/15/15   Layla Maw Ward, DO  nortriptyline (PAMELOR) 10 MG capsule Take 2 caps at night for 1 week, then increase to 3 caps at night and continue Patient not taking: Reported on 10/19/2015 05/04/15   Van Clines, MD    Family History Family History  Problem Relation Age of Onset  . Hypertension Father   . Diabetes Maternal Grandmother   . Stroke Maternal Grandmother     Social History Social History  Substance Use Topics  . Smoking status: Never Smoker  . Smokeless tobacco: Never Used  . Alcohol use Yes     Comment: occasionally     Allergies   Depakote er [divalproex sodium er]; Dilantin [phenytoin sodium extended]; and Tramadol   Review of Systems Review of Systems  Neurological: Positive for dizziness and seizures.  All other systems reviewed and are negative.    Physical Exam Updated Vital Signs BP 149/92   Pulse 104   Resp 22   SpO2 93%   Physical Exam  Constitutional: He is oriented to person, place, and time. He appears well-developed and well-nourished.  HENT:  Head: Normocephalic and atraumatic.  Right Ear: Hearing and tympanic membrane normal.  Left Ear: Hearing and tympanic membrane normal.  Nose: Nose normal.  Mouth/Throat: Uvula is midline, oropharynx is clear and moist and mucous membranes are normal.  Right frontal sinus tenderness noted  Eyes: Conjunctivae and EOM are normal. Pupils are equal, round, and reactive to light.  Pupils symmetric and reactive bilaterally, EOMs fully intact, no nystagmus  Neck: Normal range of motion and full passive range of motion without pain. No tracheal tenderness, no spinous process tenderness and no muscular tenderness present. No neck rigidity. Normal range of motion present.  Cardiovascular:  Normal rate, regular rhythm and normal heart sounds.   Pulmonary/Chest: Effort normal and breath sounds normal. No stridor.  Mid-sternal and right chest wall are TTP; no deformities or signs of trauma  Abdominal: Soft. Bowel sounds are normal.  Musculoskeletal: Normal range of motion.  No deformities of the right arm noted; full ROM  Neurological: He is alert and oriented to person, place, and time.  AAOx3, answering questions and following commands appropriately; states he feels weak in the right leg however strength testing is inconsistent on multiple attempts as patient is overall exerting poor effort and is somewhat uncooperative with this; he is moving all of his extremities equally without signs of ataxia when performing tasks such as using phone; no facial droop, speech clear  Skin: Skin is warm and dry.  Psychiatric: His mood appears anxious.  Appears anxious  Nursing note and vitals reviewed.    ED  Treatments / Results  Labs (all labs ordered are listed, but only abnormal results are displayed) Labs Reviewed  CBC WITH DIFFERENTIAL/PLATELET - Abnormal; Notable for the following:       Result Value   Monocytes Absolute 1.2 (*)    All other components within normal limits  BASIC METABOLIC PANEL - Abnormal; Notable for the following:    Glucose, Bld 114 (*)    Creatinine, Ser 1.25 (*)    All other components within normal limits  URINALYSIS, ROUTINE W REFLEX MICROSCOPIC (NOT AT [facility]) - Abnormal; Notable for the following:    APPearance CLOUDY (*)    All other components within normal limits  I-STAT TROPOININ, ED    EKG  EKG Interpretation None       Radiology Ct Head Wo Contrast  Result Date: 10/23/2015 CLINICAL DATA:  Lambert ModySharp left-sided headache of 1 week duration with dizziness and right arm pain. Seizure 10 days ago. EXAM: CT HEAD WITHOUT CONTRAST TECHNIQUE: Contiguous axial images were obtained from the base of the skull through the vertex without intravenous  contrast. COMPARISON:  05/15/2015 CT.  04/18/2013 MRI. FINDINGS: The brain has a normal appearance without evidence of malformation, atrophy, old or acute infarction, mass lesion, hemorrhage, hydrocephalus or extra-axial collection. The calvarium is unremarkable. There is right frontal sinusitis that could be symptomatic. Other sinuses are clear. IMPRESSION: Normal intracranial appearance. Right frontal sinusitis. Pattern of sinus disease is less pronounced than was seen in March of this year. Electronically Signed   By: Paulina FusiMark  Shogry M.D.   On: 10/23/2015 14:22    Procedures Procedures (including critical care time)  Medications Ordered in ED Medications - No data to display   Initial Impression / Assessment and Plan / ED Course  I have reviewed the triage vital signs and the nursing notes.  Pertinent labs & imaging results that were available during my care of the patient were reviewed by me and considered in my medical decision making (see chart for details).  Clinical Course   30 year old male with history of seizures here with persistent dizziness over the past week after striking his head during a seizure. Patient is awake, alert, fully oriented during my exam. His dizziness is described as more of a lightheadedness, worse with movement and standing. He had a CT performed earlier this morning which revealed some sinusitis, no other acute intracranial findings. His neurologic exam is somewhat difficult as he is mildly uncooperative and appears to be exerting poor effort. He does report some weakness of the right leg which apparently was present at his prior ED visit as well.  No witnessed seizure activity here.  Also report some chest pain which is reproducible on exam.  May be due to his coughing.  Will repeat labs today and CXR.  Patient's symptoms may very well be post-concussive in nature given his fall, however since this is patient's second visit within 5 days for similar complaints, will  discuss with neurology to see if further work-up warranted.  9:30 PM Spoke with neurology, Dr. Roxy Mannsster-- discussed HPI, PE findings with noted inconsistencies, and prior ED visits as well as prior work ups and work-up thus far today-- he agrees that this is likely post-concussive, does not recommend MRI unless any acute changes in mental status or other objective exam findings as with inconsistent exam, acute process less likely and CT should be sufficient.    10:25 PM On repeat exam after IV fluids and night time dose of seizure medications, patient states he  feeling better.  Mild tachycardia has resolved.  Discussed his lab and x-ray findings which are reassuring.  Do not suspect acute ACS, PE, dissection, or other acute cardiac event at this time given reproducible nature of his pain and negative troponin after several days of ongoing pain.  He continues freely moving his extremities without ataxia.  Strength testing remains inconsistent, patient still uncooperative with this.  No seizure activity or tremors here.  Discussed with patient that his recurrent dizziness may be due to postconcussive syndrome given his fall during seizure 1 week ago. He agrees. He states he is feeling better at this time and is comfortable going home to follow-up with his neurologist. Maryclare LabradorWe'll continue his seizure medications as scheduled. In regards to the facial pressure and likely postnasal drip, will start on course of amoxicillin given sinusitis noted on head CT. His airway is intact and he is handling his secretions well without any noted dysphagia as he has been able to take his oral medications and drink fluids here as normal.    Discussed plan with patient, he acknowledged understanding and agreed with plan of care.  Return precautions given for new or worsening symptoms.  Case discussed with attending physician, Dr. Clarene DukeLittle, who agrees with assessment and plan of care.  Final Clinical Impressions(s) / ED Diagnoses    Final diagnoses:  Dizziness  Acute frontal sinusitis, recurrence not specified    New Prescriptions Discharge Medication List as of 10/23/2015 10:45 PM    START taking these medications   Details  amoxicillin (AMOXIL) 500 MG capsule Take 1 capsule (500 mg total) by mouth 3 (three) times daily., Starting Tue 10/23/2015, Print         Garlon HatchetLisa M Sanders, PA-C 10/24/15 0042    Laurence Spatesachel Morgan Little, MD 10/25/15 [phone]

## 2015-10-24 ENCOUNTER — Telehealth: Payer: Self-pay | Admitting: Neurology

## 2015-10-24 ENCOUNTER — Encounter: Payer: Self-pay | Admitting: Neurology

## 2015-10-24 NOTE — Telephone Encounter (Signed)
Patient states that he was having chest pain yesterday and went to the hospital and they did a ct scan and it could be a concussion please call patient he also wants to know about a letter please call [phone]

## 2015-10-24 NOTE — Telephone Encounter (Signed)
Notified pt of CT results.  

## 2015-11-05 ENCOUNTER — Telehealth: Payer: Self-pay | Admitting: Neurology

## 2015-11-05 NOTE — Telephone Encounter (Signed)
Pt is requesting copy of [facility] that was recently [facility].   Pt also requesting copy of [facility].  Copies printed and put up front for pt pick up.

## 2015-11-05 NOTE — Telephone Encounter (Signed)
Peter Barr [birth year]. He was needing a copy of the letter that was sent to [facility] regarding [facility] disability.  His # is [phone]. Thank you

## 2015-11-23 ENCOUNTER — Ambulatory Visit: Payer: BLUE CROSS/BLUE SHIELD | Admitting: Neurology

## 2015-12-03 ENCOUNTER — Telehealth: Payer: Self-pay | Admitting: Neurology

## 2015-12-03 ENCOUNTER — Encounter: Payer: Self-pay | Admitting: Neurology

## 2015-12-03 NOTE — Telephone Encounter (Signed)
Pt's email requesting a letter for assistance sent to Dr. Karel JarvisAquino.

## 2015-12-03 NOTE — Telephone Encounter (Signed)
Peter Barr Care 06/29/1985. His # is [phone]. He is at [place] [employer]. He is needing assistance for heating and cooling. He said he has also sent an e mail as well with the fax #. Thank you

## 2015-12-23 ENCOUNTER — Encounter (HOSPITAL_COMMUNITY): Payer: Self-pay

## 2015-12-23 ENCOUNTER — Inpatient Hospital Stay (HOSPITAL_COMMUNITY)
Admission: EM | Admit: 2015-12-23 | Discharge: 2015-12-26 | DRG: 101 | Disposition: A | Discharge status: Home / Self Care | Payer: Medicaid Other | Attending: Family Medicine | Admitting: Family Medicine

## 2015-12-23 DIAGNOSIS — G43909 Migraine, unspecified, not intractable, without status migrainosus: Secondary | ICD-10-CM | POA: Diagnosis present

## 2015-12-23 DIAGNOSIS — Z79899 Other long term (current) drug therapy: Secondary | ICD-10-CM

## 2015-12-23 DIAGNOSIS — R569 Unspecified convulsions: Secondary | ICD-10-CM

## 2015-12-23 DIAGNOSIS — G40909 Epilepsy, unspecified, not intractable, without status epilepticus: Principal | ICD-10-CM | POA: Diagnosis present

## 2015-12-23 DIAGNOSIS — Z9114 Patient's other noncompliance with medication regimen: Secondary | ICD-10-CM

## 2015-12-23 DIAGNOSIS — Z800 Family history of malignant neoplasm of digestive organs: Secondary | ICD-10-CM

## 2015-12-23 DIAGNOSIS — I10 Essential (primary) hypertension: Secondary | ICD-10-CM | POA: Diagnosis present

## 2015-12-23 DIAGNOSIS — E876 Hypokalemia: Secondary | ICD-10-CM | POA: Diagnosis present

## 2015-12-23 DIAGNOSIS — E8809 Other disorders of plasma-protein metabolism, not elsewhere classified: Secondary | ICD-10-CM | POA: Diagnosis present

## 2015-12-23 DIAGNOSIS — M25511 Pain in right shoulder: Secondary | ICD-10-CM | POA: Diagnosis present

## 2015-12-23 DIAGNOSIS — M62838 Other muscle spasm: Secondary | ICD-10-CM | POA: Diagnosis not present

## 2015-12-23 DIAGNOSIS — G40019 Localization-related (focal) (partial) idiopathic epilepsy and epileptic syndromes with seizures of localized onset, intractable, without status epilepticus: Secondary | ICD-10-CM

## 2015-12-23 DIAGNOSIS — F419 Anxiety disorder, unspecified: Secondary | ICD-10-CM | POA: Diagnosis present

## 2015-12-23 DIAGNOSIS — Z888 Allergy status to other drugs, medicaments and biological substances status: Secondary | ICD-10-CM

## 2015-12-23 DIAGNOSIS — Z8249 Family history of ischemic heart disease and other diseases of the circulatory system: Secondary | ICD-10-CM

## 2015-12-23 HISTORY — DX: Other complications of anesthesia, initial encounter: T88.59XA

## 2015-12-23 HISTORY — DX: Adverse effect of unspecified anesthetic, initial encounter: T41.45XA

## 2015-12-23 LAB — CBG MONITORING, ED
Glucose-Capillary: 134 mg/dL — ABNORMAL HIGH (ref 65–99)
Glucose-Capillary: 179 mg/dL — ABNORMAL HIGH (ref 65–99)

## 2015-12-23 NOTE — ED Provider Notes (Signed)
[facility]-EMERGENCY DEPT Provider Note   CSN: [id number] Arrival date & time: 12/23/15  2257     History   Chief Complaint Chief Complaint  Patient presents with  . Seizures    HPI Peter Barr is a 30 y.o. male with a hx of seizures presents to the Emergency Department complaining of 2 grand mal seizures one at 10:30am and a second one at 10:30pm.  No falls as he was in bed both times.  Pt wife reports no noticeable aura. Pt's wife reports that after a seizure he has difficulty walking.  Pt with severe fatigue today after his seizure today but this is normal.  Associated symptoms include right sided numbness and paresthesias after evening seizure that continue to persist.  Pt's wife reports this is new over the last 2 months.  Pt's last grand mal seizure was in Aug 2017 just before his medication dose increase.  Wife reports petite mal seizures 3-4x/month.  First seizure was Feb 2015.  Pt denies lack of sleep or other known seizures.  Pt reports missing his night dose (100mg  lamictal) but no other missed doses.  [place] [facility]   The history is provided by the patient, the spouse and medical records. No language interpreter was used.    Past Medical History:  Diagnosis Date  . Anxiety   . Hypertension   . Migraine   . Seizures [facility])     Patient Active Problem List   Diagnosis Date Noted  . Hyperkalemia 12/24/2015  . Hypokalemia 12/24/2015  . Hypoalbuminemia 12/24/2015  . Hypocalcemia 12/24/2015  . Localization-related idiopathic epilepsy and epileptic syndromes with seizures of localized onset, intractable, without status epilepticus (HCC) 05/09/2015  . Migraine without aura and without status migrainosus, not intractable 05/09/2015  . Dizziness and giddiness 05/25/2013  . Gait disturbance 05/25/2013  . Paresthesia 04/29/2013  . Back pain 04/29/2013  . Hypertension   . Migraine   . Anxiety   . Seizure (HCC) 04/16/2013  .  Seizures (HCC) 04/16/2013  . Dehydration 04/16/2013  . Sprain of ankle, unspecified site 04/23/2012    Past Surgical History:  Procedure Laterality Date  . KNEE SURGERY Right        Home Medications    Prior to Admission medications   Medication Sig Start Date End Date Taking? Authorizing Provider  acetaminophen (TYLENOL) 500 MG tablet Take 1,000 mg by mouth every 8 (eight) hours as needed for mild pain.    Yes Historical Provider, MD  lamoTRIgine (LAMICTAL) 200 MG tablet Take 1 tablet (200 mg total) by mouth daily. Patient taking differently: Take 100-200 mg by mouth See admin instructions. 100 mg every morning and 200 mg every evening 10/15/15  Yes Van Clines, MD  naphazoline-pheniramine (NAPHCON-A) 0.025-0.3 % ophthalmic solution Place 1-2 drops into both eyes 4 (four) times daily as needed for irritation or allergies.   Yes Historical Provider, MD    Family History Family History  Problem Relation Age of Onset  . Hypertension Father   . Colon cancer Maternal Grandfather     Social History Social History  Substance Use Topics  . Smoking status: Never Smoker  . Smokeless tobacco: Never Used  . Alcohol use Yes     Comment: occasionally     Allergies   Depakote er [divalproex sodium er]; Dilantin [phenytoin sodium extended]; Adhesive [tape]; and Tramadol   Review of Systems Review of Systems  Neurological: Positive for seizures.  All  other systems reviewed and are negative.    Physical Exam Updated Vital Signs BP 147/96   Pulse 92   Temp 98.8 F (37.1 C) (Oral)   Resp 26   SpO2 96%   Physical Exam  Constitutional: He is oriented to person, place, and time. He appears well-developed and well-nourished. No distress.  HENT:  Head: Normocephalic and atraumatic.  Mouth/Throat: Oropharynx is clear and moist.  Swelling, laceration and ecchymosis to the bilateral edges of the tongue  Eyes: Conjunctivae and EOM are normal. Pupils are equal, round, and  reactive to light. No scleral icterus.  No horizontal, vertical or rotational nystagmus  Neck: Normal range of motion. Neck supple.  Full active and passive ROM with some paraspinal pain No midline tenderness, bilateral paraspinal tenderness noted No nuchal rigidity or meningeal signs  Cardiovascular: Normal rate, regular rhythm and intact distal pulses.   Pulmonary/Chest: Effort normal and breath sounds normal. No respiratory distress. He has no wheezes. He has no rales.  Abdominal: Soft. Bowel sounds are normal. There is no tenderness. There is no rebound and no guarding.  Musculoskeletal: Normal range of motion.  Lymphadenopathy:    He has no cervical adenopathy.  Neurological: He is alert and oriented to person, place, and time. No cranial nerve deficit. He exhibits normal muscle tone. Coordination normal.  Mental Status:  Alert, oriented. Able to follow commands without difficulty.  Cranial Nerves:  II:  pupils equal, round, reactive to light III,IV, VI: ptosis not present, extra-ocular motions intact bilaterally  V,VII: smile symmetric, facial light touch sensation equal IX,X: midline uvula rise  XI: bilateral shoulder shrug equal and strong XII: midline tongue extension  Motor:  5/5 in left upper and bilateral lower extremities strength and dorsiflexion/plantar flexion 4/5 in right upper extremity with reports of paresthesia Sensory: Decreased sensation in the right upper extremity, normal in all others.  CV: distal pulses palpable throughout  No clonus  Skin: Skin is warm and dry. No rash noted. He is not diaphoretic.  Psychiatric: He has a normal mood and affect. His behavior is normal. Judgment and thought content normal.  Nursing note and vitals reviewed.    ED Treatments / Results  Labs (all labs ordered are listed, but only abnormal results are displayed) Labs Reviewed  CBC WITH DIFFERENTIAL/PLATELET - Abnormal; Notable for the following:       Result Value   WBC  13.0 (*)    Neutro Abs 9.7 (*)    Monocytes Absolute 1.1 (*)    All other components within normal limits  COMPREHENSIVE METABOLIC PANEL - Abnormal; Notable for the following:    Potassium 2.4 (*)    Chloride 123 (*)    CO2 15 (*)    Calcium 5.4 (*)    Total Protein 4.0 (*)    Albumin 2.0 (*)    ALT 14 (*)    Total Bilirubin 0.2 (*)    Anion gap 4 (*)    All other components within normal limits  MAGNESIUM - Abnormal; Notable for the following:    Magnesium 1.2 (*)    All other components within normal limits  PHOSPHORUS - Abnormal; Notable for the following:    Phosphorus 1.4 (*)    All other components within normal limits  CBG MONITORING, ED - Abnormal; Notable for the following:    Glucose-Capillary 134 (*)    All other components within normal limits  CBG MONITORING, ED - Abnormal; Notable for the following:    Glucose-Capillary 179 (*)  All other components within normal limits  URINALYSIS, ROUTINE W REFLEX MICROSCOPIC (NOT AT [facility])  CALCIUM, URINE, RANDOM  NA AND K (SODIUM & POTASSIUM), RAND UR    EKG  EKG Interpretation  Date/Time:  Monday December 24 2015 05:00:36 EDT Ventricular Rate:  93 PR Interval:    QRS Duration: 100 QT Interval:  362 QTC Calculation: 451 R Axis:   11 Text Interpretation:  Sinus rhythm Normal ECG Confirmed by POLLINA  MD, CHRISTOPHER [phone]) on 12/24/2015 7:09:10 AM       Radiology Ct Head Wo Contrast  Result Date: 12/24/2015 CLINICAL DATA:  Seizure, headache and right shoulder pain EXAM: CT HEAD WITHOUT CONTRAST TECHNIQUE: Contiguous axial images were obtained from the base of the skull through the vertex without intravenous contrast. COMPARISON:  10/23/2015, 04/16/2013  CT and MRI 04/18/2013 FINDINGS: BRAIN: The ventricles and sulci are normal. No intraparenchymal hemorrhage, mass effect nor midline shift. No acute large vascular territory infarcts. No abnormal extra-axial fluid collections. Basal cisterns are patent. Partial  volume averaging of dural calcification overlying the posterior right parietal lobe. VASCULAR: Unremarkable. SKULL/SOFT TISSUES: No skull fracture. No significant soft tissue swelling. ORBITS/SINUSES: The included ocular globes and orbital contents are normal.Mastoids appear clear. Anterior ethmoid sinus and right frontal sinus mucosal thickening. OTHER: None. O IMPRESSION: No acute intracranial abnormality. Minimal ethmoid sinus mucosal thickening. Electronically Signed   By: Tollie Eth M.D.   On: 12/24/2015 01:54   Dg Shoulder Right Port  Result Date: 12/24/2015 CLINICAL DATA:  Right shoulder pain after seizure EXAM: PORTABLE RIGHT SHOULDER COMPARISON:  Chest radiograph 04/16/2013 FINDINGS: There is no evidence of fracture or dislocation. There is no evidence of arthropathy or other focal bone abnormality. Soft tissues are unremarkable. IMPRESSION: No acute fracture or malalignment of the right shoulder. Electronically Signed   By: Tollie Eth M.D.   On: 12/24/2015 01:56    Procedures Procedures (including critical care time)  Medications Ordered in ED Medications  potassium chloride 10 mEq in 100 mL IVPB (10 mEq Intravenous New Bag/Given 12/24/15 0802)  lamoTRIgine (LAMICTAL) tablet 200 mg (not administered)  calcium gluconate 1 g in sodium chloride 0.9 % 100 mL IVPB (1 g Intravenous New Bag/Given 12/24/15 0803)  0.9 % NaCl with KCl 40 mEq / L  infusion (not administered)  sodium chloride 0.9 % bolus 1,000 mL (0 mLs Intravenous Stopped 12/24/15 0126)  LORazepam (ATIVAN) injection 1 mg (1 mg Intravenous Given 12/24/15 0051)  potassium chloride SA (K-DUR,KLOR-CON) CR tablet 40 mEq (40 mEq Oral Given 12/24/15 0512)  ketorolac (TORADOL) 30 MG/ML injection 30 mg (30 mg Intravenous Given 12/24/15 1191)     Initial Impression / Assessment and Plan / ED Course  I have reviewed the triage vital signs and the nursing notes.  Pertinent labs & imaging results that were available during my care of  the patient were reviewed by me and considered in my medical decision making (see chart for details).  Clinical Course  Value Comment By Time  WBC: (!) 13.0 Leukocytosis Dierdre Forth, PA-C 10/15 2300  Glucose-Capillary: (!) 179 (Reviewed) Dierdre Forth, PA-C 10/15 2300  Leukocytes, UA: NEGATIVE No evidence of UTI Dierdre Forth, PA-C 10/15 2300  Potassium: (!!) 2.4 Significant hypokalemia. Dierdre Forth, PA-C 10/16 0500   Discussed with Dr. Robb Matar who will admit Dierdre Forth, PA-C 10/16 4782  EKG 12-Lead Normal sinus rhythm Dierdre Forth, PA-C 10/16 [phone]    Patient here after seizure. Patient had 2 seizures today.  Patient's spouse reports that he  has returned to mental baseline. He often has abnormal neurologic findings after his seizures. Today he has right upper extremity weakness and paresthesias along with pain.  Lab work reveals hypokalemia at 2.4. Repletion begun.  No trauma or other known triggers for his seizure.  Final Clinical Impressions(s) / ED Diagnoses   Final diagnoses:  Seizure (HCC)  Hypokalemia    New Prescriptions New Prescriptions   No medications on file     Dierdre ForthHannah Havilah Topor, PA-C 12/24/15 [id number]    Gilda Creasehristopher J Pollina, MD 12/25/15 [phone]

## 2015-12-23 NOTE — ED Triage Notes (Signed)
Pt states had seizure today. Pt states hx of epilepsy x 2 years. Pt states ran out of medicine yesterday. Pt a/o x 4, some slurred speech. Pt states unsure of trigger. CBG  = 134 at triage.

## 2015-12-23 NOTE — ED Notes (Signed)
CBG 179 RN Callie informed

## 2015-12-24 ENCOUNTER — Emergency Department (HOSPITAL_COMMUNITY): Payer: Medicaid Other

## 2015-12-24 ENCOUNTER — Telehealth: Payer: Self-pay

## 2015-12-24 ENCOUNTER — Encounter (HOSPITAL_COMMUNITY): Payer: Self-pay | Admitting: Internal Medicine

## 2015-12-24 DIAGNOSIS — E875 Hyperkalemia: Secondary | ICD-10-CM | POA: Insufficient documentation

## 2015-12-24 DIAGNOSIS — I10 Essential (primary) hypertension: Secondary | ICD-10-CM | POA: Diagnosis present

## 2015-12-24 DIAGNOSIS — M62838 Other muscle spasm: Secondary | ICD-10-CM | POA: Diagnosis not present

## 2015-12-24 DIAGNOSIS — E876 Hypokalemia: Secondary | ICD-10-CM | POA: Diagnosis present

## 2015-12-24 DIAGNOSIS — Z9114 Patient's other noncompliance with medication regimen: Secondary | ICD-10-CM | POA: Diagnosis not present

## 2015-12-24 DIAGNOSIS — G40909 Epilepsy, unspecified, not intractable, without status epilepticus: Secondary | ICD-10-CM | POA: Diagnosis present

## 2015-12-24 DIAGNOSIS — G43909 Migraine, unspecified, not intractable, without status migrainosus: Secondary | ICD-10-CM | POA: Diagnosis present

## 2015-12-24 DIAGNOSIS — M25511 Pain in right shoulder: Secondary | ICD-10-CM | POA: Diagnosis present

## 2015-12-24 DIAGNOSIS — R569 Unspecified convulsions: Secondary | ICD-10-CM | POA: Diagnosis present

## 2015-12-24 DIAGNOSIS — E8809 Other disorders of plasma-protein metabolism, not elsewhere classified: Secondary | ICD-10-CM | POA: Diagnosis present

## 2015-12-24 DIAGNOSIS — Z79899 Other long term (current) drug therapy: Secondary | ICD-10-CM | POA: Diagnosis not present

## 2015-12-24 DIAGNOSIS — Z800 Family history of malignant neoplasm of digestive organs: Secondary | ICD-10-CM | POA: Diagnosis not present

## 2015-12-24 DIAGNOSIS — F419 Anxiety disorder, unspecified: Secondary | ICD-10-CM | POA: Diagnosis present

## 2015-12-24 DIAGNOSIS — Z8249 Family history of ischemic heart disease and other diseases of the circulatory system: Secondary | ICD-10-CM | POA: Diagnosis not present

## 2015-12-24 DIAGNOSIS — Z888 Allergy status to other drugs, medicaments and biological substances status: Secondary | ICD-10-CM | POA: Diagnosis not present

## 2015-12-24 LAB — COMPREHENSIVE METABOLIC PANEL
ALK PHOS: 43 U/L (ref 38–126)
ALT: 14 U/L — AB (ref 17–63)
ALT: 22 U/L (ref 17–63)
ANION GAP: 4 — AB (ref 5–15)
AST: 18 U/L (ref 15–41)
AST: 28 U/L (ref 15–41)
Albumin: 2 g/dL — ABNORMAL LOW (ref 3.5–5.0)
Albumin: 3.4 g/dL — ABNORMAL LOW (ref 3.5–5.0)
Alkaline Phosphatase: 74 U/L (ref 38–126)
Anion gap: 6 (ref 5–15)
BILIRUBIN TOTAL: 0.2 mg/dL — AB (ref 0.3–1.2)
BILIRUBIN TOTAL: 0.6 mg/dL (ref 0.3–1.2)
BUN: 8 mg/dL (ref 6–20)
BUN: 7 mg/dL (ref 6–20)
CALCIUM: 5.4 mg/dL — AB (ref 8.9–10.3)
CO2: 15 mmol/L — AB (ref 22–32)
CO2: 24 mmol/L (ref 22–32)
CREATININE: 0.64 mg/dL (ref 0.61–1.24)
CREATININE: 1.15 mg/dL (ref 0.61–1.24)
Calcium: 8.9 mg/dL (ref 8.9–10.3)
Chloride: 123 mmol/L — ABNORMAL HIGH (ref 101–111)
Chloride: 109 mmol/L (ref 101–111)
GFR calc Af Amer: >60 mL/min (ref >60)
Glucose, Bld: 86 mg/dL (ref 65–99)
Glucose, Bld: 113 mg/dL — ABNORMAL HIGH (ref 65–99)
Potassium: 2.4 mmol/L — CL (ref 3.5–5.1)
Potassium: 3.6 mmol/L (ref 3.5–5.1)
Sodium: 142 mmol/L (ref 135–145)
Sodium: 139 mmol/L (ref 135–145)
TOTAL PROTEIN: 4 g/dL — AB (ref 6.5–8.1)
TOTAL PROTEIN: 7 g/dL (ref 6.5–8.1)

## 2015-12-24 LAB — CBC WITH DIFFERENTIAL/PLATELET
Basophils Absolute: 0.1 10*3/uL (ref 0.0–0.1)
Basophils Relative: 1 %
EOS ABS: 0.2 10*3/uL (ref 0.0–0.7)
Eosinophils Relative: 1 %
HEMATOCRIT: 43.9 % (ref 39.0–52.0)
HEMOGLOBIN: 15.1 g/dL (ref 13.0–17.0)
LYMPHS ABS: 2 10*3/uL (ref 0.7–4.0)
LYMPHS PCT: 15 %
MCH: 27 pg (ref 26.0–34.0)
MCHC: 34.4 g/dL (ref 30.0–36.0)
MCV: 78.5 fL (ref 78.0–100.0)
MONOS PCT: 8 %
Monocytes Absolute: 1.1 10*3/uL — ABNORMAL HIGH (ref 0.1–1.0)
NEUTROS PCT: 75 %
Neutro Abs: 9.7 10*3/uL — ABNORMAL HIGH (ref 1.7–7.7)
Platelets: 364 10*3/uL (ref 150–400)
RBC: 5.59 MIL/uL (ref 4.22–5.81)
RDW: 14.4 % (ref 11.5–15.5)
WBC: 13 10*3/uL — ABNORMAL HIGH (ref 4.0–10.5)

## 2015-12-24 LAB — BASIC METABOLIC PANEL
Anion gap: 6 (ref 5–15)
BUN: 8 mg/dL (ref 6–20)
CHLORIDE: 108 mmol/L (ref 101–111)
CO2: 24 mmol/L (ref 22–32)
CREATININE: 1.17 mg/dL (ref 0.61–1.24)
Calcium: 8.7 mg/dL — ABNORMAL LOW (ref 8.9–10.3)
GFR calc non Af Amer: >60 mL/min (ref >60)
GLUCOSE: 108 mg/dL — AB (ref 65–99)
Potassium: 3.7 mmol/L (ref 3.5–5.1)
Sodium: 138 mmol/L (ref 135–145)

## 2015-12-24 LAB — PHOSPHORUS: PHOSPHORUS: 1.4 mg/dL — AB (ref 2.5–4.6)

## 2015-12-24 LAB — URINALYSIS, ROUTINE W REFLEX MICROSCOPIC
BILIRUBIN URINE: NEGATIVE
GLUCOSE, UA: NEGATIVE mg/dL
HGB URINE DIPSTICK: NEGATIVE
Ketones, ur: NEGATIVE mg/dL
Leukocytes, UA: NEGATIVE
Nitrite: NEGATIVE
PH: 5 (ref 5.0–8.0)
Protein, ur: NEGATIVE mg/dL
SPECIFIC GRAVITY, URINE: 1.025 (ref 1.005–1.030)

## 2015-12-24 LAB — MAGNESIUM: Magnesium: 1.2 mg/dL — ABNORMAL LOW (ref 1.7–2.4)

## 2015-12-24 LAB — CBC
HEMATOCRIT: 41.3 % (ref 39.0–52.0)
Hemoglobin: 13.8 g/dL (ref 13.0–17.0)
MCH: 26.6 pg (ref 26.0–34.0)
MCHC: 33.4 g/dL (ref 30.0–36.0)
MCV: 79.7 fL (ref 78.0–100.0)
Platelets: 209 10*3/uL (ref 150–400)
RBC: 5.18 MIL/uL (ref 4.22–5.81)
RDW: 14.3 % (ref 11.5–15.5)
WBC: 10.6 10*3/uL — AB (ref 4.0–10.5)

## 2015-12-24 LAB — NA AND K (SODIUM & POTASSIUM), RAND UR
Potassium Urine: 22 mmol/L
Sodium, Ur: 164 mmol/L

## 2015-12-24 LAB — POTASSIUM: Potassium: 3.9 mmol/L (ref 3.5–5.1)

## 2015-12-24 MED ORDER — SODIUM CHLORIDE 0.9 % IV SOLN
1.0000 g | Freq: Once | INTRAVENOUS | Status: AC
  Administered 2015-12-24: 1 g via INTRAVENOUS
  Filled 2015-12-24: qty 10

## 2015-12-24 MED ORDER — LORAZEPAM 2 MG/ML IJ SOLN: 2.0000 mg | INTRAMUSCULAR | Status: DC | PRN

## 2015-12-24 MED ORDER — KETOROLAC TROMETHAMINE 30 MG/ML IJ SOLN
30.0000 mg | Freq: Once | INTRAMUSCULAR | Status: AC
  Administered 2015-12-24: 30 mg via INTRAVENOUS
  Filled 2015-12-24: qty 1

## 2015-12-24 MED ORDER — HYDROCODONE-ACETAMINOPHEN 5-325 MG PO TABS
1.0000 | ORAL_TABLET | Freq: Four times a day (QID) | ORAL | Status: DC | PRN
  Administered 2015-12-24: 1 via ORAL
  Administered 2015-12-25 – 2015-12-26 (×5): 2 via ORAL
  Filled 2015-12-24 (×2): qty 2
  Filled 2015-12-24: qty 1
  Filled 2015-12-24 (×3): qty 2

## 2015-12-24 MED ORDER — SODIUM CHLORIDE 0.9 % IV BOLUS (SEPSIS)
1000.0000 mL | INTRAVENOUS | Status: AC
  Administered 2015-12-24: 1000 mL via INTRAVENOUS

## 2015-12-24 MED ORDER — ACETAMINOPHEN 500 MG PO TABS
1000.0000 mg | ORAL_TABLET | Freq: Three times a day (TID) | ORAL | Status: DC | PRN
  Administered 2015-12-24 – 2015-12-25 (×2): 1000 mg via ORAL
  Filled 2015-12-24 (×2): qty 2

## 2015-12-24 MED ORDER — INFLUENZA VAC SPLIT QUAD 0.5 ML IM SUSY
0.5000 mL | PREFILLED_SYRINGE | INTRAMUSCULAR | Status: AC
  Administered 2015-12-25: 0.5 mL via INTRAMUSCULAR
  Filled 2015-12-24: qty 0.5

## 2015-12-24 MED ORDER — LAMOTRIGINE 100 MG PO TABS
100.0000 mg | ORAL_TABLET | Freq: Once | ORAL | Status: DC
  Administered 2015-12-24: 100 mg via ORAL
  Filled 2015-12-24: qty 1

## 2015-12-24 MED ORDER — POTASSIUM CHLORIDE IN NACL 40-0.9 MEQ/L-% IV SOLN
INTRAVENOUS | Status: DC
  Filled 2015-12-24: qty 1000

## 2015-12-24 MED ORDER — MAGIC MOUTHWASH W/LIDOCAINE
15.0000 mL | Freq: Four times a day (QID) | ORAL | Status: DC | PRN
  Administered 2015-12-24: 15 mL via ORAL
  Filled 2015-12-24 (×2): qty 15

## 2015-12-24 MED ORDER — LAMOTRIGINE 100 MG PO TABS
200.0000 mg | ORAL_TABLET | Freq: Every day | ORAL | Status: DC
  Administered 2015-12-24 – 2015-12-25 (×2): 200 mg via ORAL
  Filled 2015-12-24 (×2): qty 2

## 2015-12-24 MED ORDER — POTASSIUM CHLORIDE CRYS ER 20 MEQ PO TBCR
40.0000 meq | EXTENDED_RELEASE_TABLET | Freq: Once | ORAL | Status: AC
  Administered 2015-12-24: 40 meq via ORAL
  Filled 2015-12-24: qty 2

## 2015-12-24 MED ORDER — LAMOTRIGINE 100 MG PO TABS
200.0000 mg | ORAL_TABLET | Freq: Once | ORAL | Status: DC
  Filled 2015-12-24: qty 2

## 2015-12-24 MED ORDER — SODIUM CHLORIDE 0.9% FLUSH
3.0000 mL | Freq: Two times a day (BID) | INTRAVENOUS | Status: DC
  Administered 2015-12-24 – 2015-12-26 (×2): 3 mL via INTRAVENOUS

## 2015-12-24 MED ORDER — ENOXAPARIN SODIUM 40 MG/0.4ML ~~LOC~~ SOLN
40.0000 mg | SUBCUTANEOUS | Status: DC
Start: 2015-12-24 — End: 2015-12-26
  Administered 2015-12-24 – 2015-12-25 (×2): 40 mg via SUBCUTANEOUS
  Filled 2015-12-24 (×2): qty 0.4

## 2015-12-24 MED ORDER — LORAZEPAM 2 MG/ML IJ SOLN
1.0000 mg | Freq: Once | INTRAMUSCULAR | Status: AC
  Administered 2015-12-24: 1 mg via INTRAVENOUS
  Filled 2015-12-24: qty 1

## 2015-12-24 MED ORDER — POTASSIUM CHLORIDE 10 MEQ/100ML IV SOLN
10.0000 meq | INTRAVENOUS | Status: AC
  Administered 2015-12-24 (×3): 10 meq via INTRAVENOUS
  Filled 2015-12-24 (×3): qty 100

## 2015-12-24 MED ORDER — KCL IN DEXTROSE-NACL 40-5-0.9 MEQ/L-%-% IV SOLN
INTRAVENOUS | Status: DC
  Administered 2015-12-24 – 2015-12-26 (×5): via INTRAVENOUS
  Filled 2015-12-24 (×8): qty 1000

## 2015-12-24 MED ORDER — LAMOTRIGINE 100 MG PO TABS
100.0000 mg | ORAL_TABLET | Freq: Every day | ORAL | Status: DC
  Administered 2015-12-25 – 2015-12-26 (×2): 100 mg via ORAL
  Filled 2015-12-24 (×2): qty 1

## 2015-12-24 NOTE — H&P (Signed)
History and Physical    Peter Barr ZOX:[id number] DOB: 07-12-1985 DOA: 12/23/2015  PCP: Lupe Carney, MD   Patient coming from: Home.  Chief Complaint: Seizures.  HPI: Peter Barr is a 30 y.o. male with medical history significant of who is coming to the emergency department due to 2 episodes of seizures yesterday, one around 1030 and another one around 2230. The patient forgot to take his 200 mg Lamictal tablet on Saturday evening. He recently had his evening Lacmital increased to 200 mg after having a generalized seizure in August.  Per patient's wife, who witnessed his seizures episodes, he was in bed both time when he had the convulsions. He has complained of paresthesias and numbness after his evening seizure, which had decreased in severity since then. He also mentions right shoulder pain after second seizure. His wife stated that he was positioned laterally with his right arm/shoulder under his body. He has experienced right shoulder following second seizure.   ED Course: While the patient was being examined, he complained of severe left arm discomfort or when the blood pressure cuff was inflating. The patient received 1 mg of Ativan IVP and 200 mg of Lamictal orally. Workup shows a potassium level of 2.4, chloride 123, CO2 15 mmol/L. His calcium was 5.4 mg/dL, but his albumin was 2.0 and total protein 4.0 g/dL. Corrected calcium 2 and albumin of 4.0 is 7.4 mg/dL.  Imaging with CT scan of the head did not show any acute intracranial pathology. Right shoulder x-ray did not show any fractures or other findings.  Review of Systems: As per HPI otherwise 10 point review of systems negative.    Past Medical History:  Diagnosis Date  . Anxiety   . Hypertension   . Migraine   . Seizures (HCC)     Past Surgical History:  Procedure Laterality Date  . KNEE SURGERY Right      reports that he has never smoked. He has never used smokeless tobacco. He reports that he drinks  alcohol. He reports that he does not use drugs.  Allergies  Allergen Reactions  . Depakote Er [Divalproex Sodium Er] Other (See Comments)    Caused burns on arm  . Dilantin [Phenytoin Sodium Extended] Other (See Comments)    Caused burns on arm   . Adhesive [Tape] Itching and Rash    Heart monitor sticky pads (leads)  . Tramadol Palpitations    Spasms, dizzy (also)    Family History  Problem Relation Age of Onset  . Hypertension Father   . Colon cancer Maternal Grandfather     Prior to Admission medications   Medication Sig Start Date End Date Taking? Authorizing Provider  acetaminophen (TYLENOL) 500 MG tablet Take 1,000 mg by mouth every 8 (eight) hours as needed for mild pain.    Yes Historical Provider, MD  lamoTRIgine (LAMICTAL) 200 MG tablet Take 1 tablet (200 mg total) by mouth daily. Patient taking differently: Take 100-200 mg by mouth See admin instructions. 100 mg every morning and 200 mg every evening 10/15/15  Yes Van Clines, MD  naphazoline-pheniramine (NAPHCON-A) 0.025-0.3 % ophthalmic solution Place 1-2 drops into both eyes 4 (four) times daily as needed for irritation or allergies.   Yes Historical Provider, MD    Physical Exam:  Constitutional: NAD, calm, comfortable Vitals:   12/24/15 0000 12/24/15 0100 12/24/15 0200 12/24/15 0530  BP: 128/77 150/88 127/83 127/98  Pulse: 107 100 110 95  Resp: 23 25 22 26   Temp: 98.8 F (37.1  C)     TempSrc: Oral     SpO2: 97% 97% 96% 98%   Eyes: PERRL, lids and conjunctivae normal ENMT: Mucous membranes are moist. Left-sided glossal lacerations. Posterior pharynx clear of any exudate or lesions. Neck: normal, supple, no masses, no thyromegaly Respiratory: clear to auscultation bilaterally, no wheezing, no crackles. Normal respiratory effort. No accessory muscle use.  Cardiovascular: Regular rate and rhythm, no murmurs / rubs / gallops. No extremity edema. 2+ pedal pulses. No carotid bruits.  Abdomen: Obese, soft, no  tenderness, no masses palpated. No hepatosplenomegaly. Bowel sounds positive.  Musculoskeletal: no clubbing / cyanosis. No joint deformity upper and lower extremities. Good ROM, no contractures. Normal muscle tone.  Skin: no rashes, lesions, ulcersOn limited skin exam Neurologic: CN 2-12 grossly intact. Sensation intact, DTR normal. Strength 5/5 in all 4.  Psychiatric: Alert and oriented x 3. Mildly somnolent.    Labs on Admission: I have personally reviewed following labs and imaging studies  CBC:  Recent Labs Lab 12/23/15 2349  WBC 13.0*  NEUTROABS 9.7*  HGB 15.1  HCT 43.9  MCV 78.5  PLT 364   Basic Metabolic Panel:  Recent Labs Lab 12/24/15 0315  NA 142  K 2.4*  CL 123*  CO2 15*  GLUCOSE 86  BUN 8  CREATININE 0.64  CALCIUM 5.4*   GFR: CrCl cannot be calculated (Unknown ideal weight.). Liver Function Tests:  Recent Labs Lab 12/24/15 0315  AST 18  ALT 14*  ALKPHOS 43  BILITOT 0.2*  PROT 4.0*  ALBUMIN 2.0*   CBG:  Recent Labs Lab 12/23/15 2304 12/23/15 2353  GLUCAP 134* 179*   Urine analysis:    Component Value Date/Time   COLORURINE YELLOW 12/24/2015 0027   APPEARANCEUR CLEAR 12/24/2015 0027   LABSPEC 1.025 12/24/2015 0027   PHURINE 5.0 12/24/2015 0027   GLUCOSEU NEGATIVE 12/24/2015 0027   HGBUR NEGATIVE 12/24/2015 0027   BILIRUBINUR NEGATIVE 12/24/2015 0027   KETONESUR NEGATIVE 12/24/2015 0027   PROTEINUR NEGATIVE 12/24/2015 0027   UROBILINOGEN 1.0 07/25/2014 1148   NITRITE NEGATIVE 12/24/2015 0027   LEUKOCYTESUR NEGATIVE 12/24/2015 0027   Radiological Exams on Admission: Ct Head Wo Contrast  Result Date: 12/24/2015 CLINICAL DATA:  Seizure, headache and right shoulder pain EXAM: CT HEAD WITHOUT CONTRAST TECHNIQUE: Contiguous axial images were obtained from the base of the skull through the vertex without intravenous contrast. COMPARISON:  10/23/2015, 04/16/2013  CT and MRI 04/18/2013 FINDINGS: BRAIN: The ventricles and sulci are normal.  No intraparenchymal hemorrhage, mass effect nor midline shift. No acute large vascular territory infarcts. No abnormal extra-axial fluid collections. Basal cisterns are patent. Partial volume averaging of dural calcification overlying the posterior right parietal lobe. VASCULAR: Unremarkable. SKULL/SOFT TISSUES: No skull fracture. No significant soft tissue swelling. ORBITS/SINUSES: The included ocular globes and orbital contents are normal.Mastoids appear clear. Anterior ethmoid sinus and right frontal sinus mucosal thickening. OTHER: None. O IMPRESSION: No acute intracranial abnormality. Minimal ethmoid sinus mucosal thickening. Electronically Signed   By: Tollie Eth M.D.   On: 12/24/2015 01:54   Dg Shoulder Right Port  Result Date: 12/24/2015 CLINICAL DATA:  Right shoulder pain after seizure EXAM: PORTABLE RIGHT SHOULDER COMPARISON:  Chest radiograph 04/16/2013 FINDINGS: There is no evidence of fracture or dislocation. There is no evidence of arthropathy or other focal bone abnormality. Soft tissues are unremarkable. IMPRESSION: No acute fracture or malalignment of the right shoulder. Electronically Signed   By: Tollie Eth M.D.   On: 12/24/2015 01:56    EKG: Independently  reviewed. Vent. rate 93 BPM PR interval * ms QRS duration 100 ms QT/QTc 362/451 ms P-R-T axes 63 11 38 Sinus rhythm  Assessment/Plan Principal Problem:   Hypokalemia Admit to telemetry/observation. Continue potassium replacement. Check magnesium and phosphorus level. Follow-up potassium level.  Active Problems:   Seizures (HCC) Continue Lamictal 100 mg in a.m. and 200 mg in p.m.    Right shoulder pain. Toradol 30 mg IVP 1 dose.    Hypertension Per patient, he is currently on a diet consisting take Fish and zucchini. Continue lifestyle modifications and monitor blood pressure.    Hypoalbuminemia Per patient he is been eating regularly. Recheck albumin level later this morning.    Hypocalcemia Corrected  calcium to an albumin of 4.0 is 7.4 mg/dL. I will give calcium gluconate 1 g since the patient was experiencing a lot more discomfort than usual when the sphygmomanometer inflated to take his BP.   DVT prophylaxis: Lovenox. Code Status: Full code. Family Communication: His wife was present in the room. Disposition Plan: Admit for potassium replacement. Consults called:  Admission status: Observation/telemetry.   Bobette Moavid Manuel Dyonna Jaspers MD [facility] Pager [phone].  If 7PM-7AM, please contact night-coverage [email or web address] Password [email or web address]  12/24/2015, 6:22 AM

## 2015-12-24 NOTE — Telephone Encounter (Signed)
Have him increase Lamotrigine 200mg : Take 1 tablet twice a day. I have openings tomorrow and Tues, pls confirm with front desk, but I can see him then. Thanks

## 2015-12-24 NOTE — Telephone Encounter (Signed)
Patients wife called [facility] on 12/23/15. Initial Comment: Caller states her husband is a patient of Dr. Karel JarvisAquino. Patient is Peter Barr DOB Oct 26, 1985, he had a grandmal seizure this morning around 10:30, just wanted to send a note to the office and see if the doctor can see him earlier then their next schedule appointment.

## 2015-12-24 NOTE — ED Notes (Signed)
Pt received 100mg  lamictal po prior to order being modified to 200mg  lamictal. PA Muthersbaugh aware. Verbal order received to give additional 100 mg lamictal po.

## 2015-12-24 NOTE — ED Notes (Signed)
Pt has received two of three runs of potassium at this time.  Pt received 100mg  of 200mg  Lamictal dose.  Pt AOx4, no seizure activity noted since arrival.

## 2015-12-24 NOTE — Progress Notes (Signed)
Patient seen and evaluated earlier this AM by my associate. Please refer to H and P for details regarding assessment and plan. K and calcium replaced. Will reassess both later today.  Monitor for recurrence of seizure in house. Continue his home medication regimen. Breakthrough seizure most likely due to non compliance with medication regimen as reported in HPI.  Peter Barr   Pt admitted for breakthrough seizure but also found to have profound hypokalemia and hypocalcemia.  Will reassess next am  Gen: Pt in nad, alert and awake Cv: no cyanosis Pulm: no increased wob, no wheezes

## 2015-12-24 NOTE — Progress Notes (Signed)
Pt admitted to 5C19.  Tele placed on patient and CCMD called; tele verified by NT.  Pt alert and oriented.  C/O right shoulder pain.  Will medicate.  Seizure pads placed on floor.  Pt verbalizes understanding to call before attempting to get out of bed.  Bed alarm on and call bell within reach.  Mother at bedside.

## 2015-12-24 NOTE — ED Notes (Signed)
Admitting provider at bedside.

## 2015-12-25 ENCOUNTER — Ambulatory Visit: Payer: Medicaid Other | Admitting: Neurology

## 2015-12-25 LAB — NA AND K (SODIUM & POTASSIUM), RAND UR
POTASSIUM UR: 15 mmol/L
SODIUM UR: 176 mmol/L

## 2015-12-25 LAB — CALCIUM, URINE, RANDOM: CALCIUM UR: 30.1 mg/dL

## 2015-12-25 MED ORDER — METHOCARBAMOL 500 MG PO TABS
500.0000 mg | ORAL_TABLET | Freq: Four times a day (QID) | ORAL | Status: DC | PRN
  Administered 2015-12-25 – 2015-12-26 (×3): 500 mg via ORAL
  Filled 2015-12-25 (×3): qty 1

## 2015-12-25 MED ORDER — MAGNESIUM SULFATE 2 GM/50ML IV SOLN
2.0000 g | Freq: Once | INTRAVENOUS | Status: AC
  Administered 2015-12-25: 2 g via INTRAVENOUS
  Filled 2015-12-25: qty 50

## 2015-12-25 MED ORDER — K PHOS MONO-SOD PHOS DI & MONO 155-852-130 MG PO TABS
250.0000 mg | ORAL_TABLET | Freq: Every day | ORAL | Status: AC
  Administered 2015-12-25: 250 mg via ORAL
  Filled 2015-12-25: qty 1

## 2015-12-25 MED ORDER — MAGIC MOUTHWASH W/LIDOCAINE
5.0000 mL | Freq: Three times a day (TID) | ORAL | Status: DC | PRN
  Administered 2015-12-25: 5 mL via ORAL
  Filled 2015-12-25: qty 5

## 2015-12-25 NOTE — Progress Notes (Signed)
Patient A/Ox4, noted some cognitive deficit. Patient c/o right leg spasm, he experienced the leg spasm after seizures. Administered regimen, noted effective. Re administered IV right wrist, x1 attempt. Educated patient on medications. RN discuss his seizure activity and his s/s. Patient noted "tired of having seizures, I just wished they could stop." He has been noncompliant. RN educated the importance of being compliant with his medication. Patient needs continue education and motivation on compliance of medication.

## 2015-12-25 NOTE — Progress Notes (Signed)
Patient ID: Peter Barr, male   DOB: 1985-08-16, 30 y.o.   MRN: [id number]  PROGRESS NOTE    Peter Barr  [id number] DOB: 1985-08-16 DOA: 12/23/2015  PCP: Lupe Carneyean Mitchell, MD   Brief Narrative:  30 year old male with past medical history significant for seizures who presented with reports of 2 back-to-back seizures one day prior to the admission. Patient reported he forgot to take Lamictal the day prior to the onset of seizures. Patient's wife apparently was a witness to both episodes of seizures. On admission, patient was hemodynamically stable. Blood work showed potassium of 2.4, chloride 123, CO2 15, calcium 5.4 with corrected calcium 7.4. He was admitted for observation.  Assessment & Plan:   Principal Problem:   Seizures - No further seizures in hospital - Continue Lamictal - Monitor for next 24 hours - Robaxin for muscle spasms  Active Problems:   Hypocalcemia - Supplemented with calcium gluconate - Calcium 8.7 this am    Hypomagnesemia / Hypophosphatemia - Does not look like this was supplemented. We will give mag sulfate and Neutra-Phos - Check magnesium and phosphorus level tomorrow morning    Hypokalemia - Supplemented - Potassium now within normal limits     DVT prophylaxis: Lovenox subcutaneous Code Status: full code  Family Communication: No family at the bedside Disposition Plan: If no further seizures, likely discharge home tomorrow 12/26/2015   Consultants:   None  Procedures:   None  Antimicrobials:   None    Subjective: Reports having muscle spasms.  Objective: Vitals:   12/24/15 2106 12/25/15 0103 12/25/15 0526 12/25/15 0645  BP: 139/88 (!) 148/87 (!) 141/96 (!) 145/98  Pulse: 86 89 79   Resp: 18 20 20    Temp: 99.3 F (37.4 C) 98.5 F (36.9 C) 98 F (36.7 C)   TempSrc: Oral Oral Oral   SpO2: 94% 97% 98%     Intake/Output Summary (Last 24 hours) at 12/25/15 0943 Last data filed at 12/25/15 0743  Gross per 24 hour    Intake          2268.67 ml  Output              200 ml  Net          2068.67 ml   There were no vitals filed for this visit.  Examination:  General exam: Appears calm and comfortable  Respiratory system: Clear to auscultation. Respiratory effort normal. Cardiovascular system: S1 & S2 heard, RRR. No JVD, murmurs, rubs, gallops or clicks. No pedal edema. Gastrointestinal system: Abdomen is nondistended, soft and nontender. No organomegaly or masses felt. Normal bowel sounds heard. Central nervous system: Alert and oriented. No focal neurological deficits. Extremities: Symmetric 5 x 5 power. Skin: No rashes, lesions or ulcers Psychiatry: Judgement and insight appear normal. Mood & affect appropriate.   Data Reviewed: I have personally reviewed following labs and imaging studies  CBC:  Recent Labs Lab 12/23/15 2349 12/24/15 1300  WBC 13.0* 10.6*  NEUTROABS 9.7*  --   HGB 15.1 13.8  HCT 43.9 41.3  MCV 78.5 79.7  PLT 364 209   Basic Metabolic Panel:  Recent Labs Lab 12/24/15 0027 12/24/15 0315 12/24/15 1100 12/24/15 1300 12/24/15 1836  NA  --  142  --  139 138  K  --  2.4* 3.9 3.6 3.7  CL  --  123*  --  109 108  CO2  --  15*  --  24 24  GLUCOSE  --  86  --  113*  108*  BUN  --  8  --  7 8  CREATININE  --  0.64  --  1.15 1.17  CALCIUM  --  5.4*  --  8.9 8.7*  MG 1.2*  --   --   --   --   PHOS 1.4*  --   --   --   --    GFR: CrCl cannot be calculated (Unknown ideal weight.). Liver Function Tests:  Recent Labs Lab 12/24/15 0315 12/24/15 1300  AST 18 28  ALT 14* 22  ALKPHOS 43 74  BILITOT 0.2* 0.6  PROT 4.0* 7.0  ALBUMIN 2.0* 3.4*   No results for input(s): LIPASE, AMYLASE in the last 168 hours. No results for input(s): AMMONIA in the last 168 hours. Coagulation Profile: No results for input(s): INR, PROTIME in the last 168 hours. Cardiac Enzymes: No results for input(s): CKTOTAL, CKMB, CKMBINDEX, TROPONINI in the last 168 hours. BNP (last 3  results) No results for input(s): PROBNP in the last 8760 hours. HbA1C: No results for input(s): HGBA1C in the last 72 hours. CBG:  Recent Labs Lab 12/23/15 2304 12/23/15 2353  GLUCAP 134* 179*   Lipid Profile: No results for input(s): CHOL, HDL, LDLCALC, TRIG, CHOLHDL, LDLDIRECT in the last 72 hours. Thyroid Function Tests: No results for input(s): TSH, T4TOTAL, FREET4, T3FREE, THYROIDAB in the last 72 hours. Anemia Panel: No results for input(s): VITAMINB12, FOLATE, FERRITIN, TIBC, IRON, RETICCTPCT in the last 72 hours. Urine analysis:    Component Value Date/Time   COLORURINE YELLOW 12/24/2015 0027   APPEARANCEUR CLEAR 12/24/2015 0027   LABSPEC 1.025 12/24/2015 0027   PHURINE 5.0 12/24/2015 0027   GLUCOSEU NEGATIVE 12/24/2015 0027   HGBUR NEGATIVE 12/24/2015 0027   BILIRUBINUR NEGATIVE 12/24/2015 0027   KETONESUR NEGATIVE 12/24/2015 0027   PROTEINUR NEGATIVE 12/24/2015 0027   UROBILINOGEN 1.0 07/25/2014 1148   NITRITE NEGATIVE 12/24/2015 0027   LEUKOCYTESUR NEGATIVE 12/24/2015 0027   Sepsis Labs: @LABRCNTIP (procalcitonin:4,lacticidven:4)   )No results found for this or any previous visit (from the past 240 hour(s)).    Radiology Studies: Ct Head Wo Contrast Result Date: 12/24/2015 No acute intracranial abnormality. Minimal ethmoid sinus mucosal thickening. Electronically Signed   By: Tollie Eth M.D.   On: 12/24/2015 01:54   Dg Shoulder Right Port Result Date: 12/24/2015 No acute fracture or malalignment of the right shoulder. Electronically Signed   By: Tollie Eth M.D.   On: 12/24/2015 01:56      Scheduled Meds: . enoxaparin (LOVENOX) injection  40 mg Subcutaneous Q24H  . Influenza vac split quadrivalent PF  0.5 mL Intramuscular Tomorrow-1000  . lamoTRIgine  100 mg Oral Daily  . lamoTRIgine  200 mg Oral Once  . lamoTRIgine  200 mg Oral QHS  . sodium chloride flush  3 mL Intravenous Q12H   Continuous Infusions: . dextrose 5 % and 0.9 % NaCl with KCl  40 mEq/L 125 mL/hr at 12/25/15 0638     LOS: 1 day    Time spent: 15 minutes  Greater than 50% of the time spent on counseling and coordinating the care.   Manson Passey, MD [facility] Pager [phone]  If 7PM-7AM, please contact night-coverage [email or web address] Password [email or web address] 12/25/2015, 9:43 AM

## 2015-12-26 DIAGNOSIS — E8809 Other disorders of plasma-protein metabolism, not elsewhere classified: Secondary | ICD-10-CM

## 2015-12-26 DIAGNOSIS — R569 Unspecified convulsions: Secondary | ICD-10-CM

## 2015-12-26 DIAGNOSIS — E876 Hypokalemia: Secondary | ICD-10-CM

## 2015-12-26 DIAGNOSIS — G40019 Localization-related (focal) (partial) idiopathic epilepsy and epileptic syndromes with seizures of localized onset, intractable, without status epilepticus: Secondary | ICD-10-CM

## 2015-12-26 LAB — BASIC METABOLIC PANEL
Anion gap: 5 (ref 5–15)
BUN: 5 mg/dL — AB (ref 6–20)
CHLORIDE: 106 mmol/L (ref 101–111)
CO2: 27 mmol/L (ref 22–32)
CREATININE: 1.14 mg/dL (ref 0.61–1.24)
Calcium: 8.8 mg/dL — ABNORMAL LOW (ref 8.9–10.3)
GFR calc Af Amer: >60 mL/min (ref >60)
GLUCOSE: 122 mg/dL — AB (ref 65–99)
Potassium: 4.3 mmol/L (ref 3.5–5.1)
SODIUM: 138 mmol/L (ref 135–145)

## 2015-12-26 LAB — PHOSPHORUS: PHOSPHORUS: 4.6 mg/dL (ref 2.5–4.6)

## 2015-12-26 LAB — MAGNESIUM: Magnesium: 1.9 mg/dL (ref 1.7–2.4)

## 2015-12-26 MED ORDER — METHOCARBAMOL 500 MG PO TABS: 500.0000 mg | ORAL_TABLET | Freq: Two times a day (BID) | ORAL | 0 refills | Status: DC | PRN

## 2015-12-26 MED ORDER — LAMOTRIGINE 200 MG PO TABS: 200.0000 mg | ORAL_TABLET | Freq: Two times a day (BID) | ORAL | 1 refills | Status: DC

## 2015-12-26 NOTE — Progress Notes (Signed)
Discharge instructions, RX's and follow up appts explained and provided a copy, patient and wife verbalized understanding. Patient left floor via wheelchair accompanied by staff no c/o pain or shortness of breath at discharge.  Crew Goren, Kae HellerMiranda Lynn, RN
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
## 2015-12-26 NOTE — Consult Note (Signed)
NEURO HOSPITALIST CONSULT NOTE   Requestig physician: Dr. Lawson Barr   Reason for Consult:Patient requesting to be seen by neurologist while inpatient   History obtained from:  Patient     HPI:                                                                                                                                          Peter Barr is an 30 y.o. male with medical history significant of who is coming to the emergency department due to 2 episodes of seizures yesterday, one around 1030 and another one around 2230 on 12/24/2015. The patient forgot to take his 200 mg Lamictal tablet on Saturday evening. While in the emergency department he was found to have a potassium level of 2.4 and a corrected calcium level of 7.4. CT of the head showed no acute intracranial abnormalities. In discussing with patient he made actually missed more than one single dose in addition he has been feeling kindly jittery lately.  Peter Barr was contacted and she recommended increasing the Lamictal 200 mg subcutaneous take 1 tab twice a day. Patient has been seizure-free. Patient was to be discharged today however he requested to see a neurologist before he left. In discussion patient is okay with seeing Peter Barr as an outpatient as she is very versed in his seizure history. He tells me that she was also considering sending him to [facility] for further evaluation.  Past Medical History:  Diagnosis Date  . Anxiety   . Complication of anesthesia    " one time I had a convulsion "  . Hypertension   . Migraine   . Seizures (HCC)     Past Surgical History:  Procedure Laterality Date  . KNEE SURGERY Right     Family History  Problem Relation Age of Onset  . Hypertension Father   . Colon cancer Maternal Grandfather      Social History:  reports that he has never smoked. He has never used smokeless tobacco. He reports that he drinks alcohol. He reports that he does not use  drugs.  Allergies  Allergen Reactions  . Depakote Er [Divalproex Sodium Er] Other (See Comments)    Caused burns on arm  . Dilantin [Phenytoin Sodium Extended] Other (See Comments)    Caused burns on arm   . Adhesive [Tape] Itching and Rash    Heart monitor sticky pads (leads)  . Tramadol Palpitations    Spasms, dizzy (also)    MEDICATIONS:  Scheduled: . enoxaparin (LOVENOX) injection  40 mg Subcutaneous Q24H  . lamoTRIgine  100 mg Oral Daily  . lamoTRIgine  200 mg Oral Once  . lamoTRIgine  200 mg Oral QHS  . sodium chloride flush  3 mL Intravenous Q12H     ROS:                                                                                                                                       History obtained from the patient  General ROS: negative for - chills, fatigue, fever, night sweats, weight gain or weight loss Psychological ROS: negative for - behavioral disorder, hallucinations, memory difficulties, mood swings or suicidal ideation Ophthalmic ROS: negative for - blurry vision, double vision, eye pain or loss of vision ENT ROS: negative for - epistaxis, nasal discharge, oral lesions, sore throat, tinnitus or vertigo Allergy and Immunology ROS: negative for - hives or itchy/watery eyes Hematological and Lymphatic ROS: negative for - bleeding problems, bruising or swollen lymph nodes Endocrine ROS: negative for - galactorrhea, hair pattern changes, polydipsia/polyuria or temperature intolerance Respiratory ROS: negative for - cough, hemoptysis, shortness of breath or wheezing Cardiovascular ROS: negative for - chest pain, dyspnea on exertion, edema or irregular heartbeat Gastrointestinal ROS: negative for - abdominal pain, diarrhea, hematemesis, nausea/vomiting or stool incontinence Genito-Urinary ROS: negative for - dysuria, hematuria, incontinence or  urinary frequency/urgency Musculoskeletal ROS: negative for - joint swelling or muscular weakness Neurological ROS: as noted in HPI Dermatological ROS: negative for rash and skin lesion changes   Blood pressure (!) 137/100, pulse 73, temperature 98.1 F (36.7 C), temperature source Oral, resp. rate 20, SpO2 95 %.   Neurologic Examination:                                                                                                      HEENT-  Normocephalic, no lesions, without obvious abnormality.  Normal external eye and conjunctiva.  Normal TM's bilaterally.  Normal auditory canals and external ears. Normal external nose, mucus membranes and septum.  Normal pharynx. Cardiovascular- S1, S2 normal, pulses palpable throughout   Lungs- chest clear, no wheezing, rales, normal symmetric air entry Abdomen- normal findings: bowel sounds normal Extremities- no edema Lymph-no adenopathy palpable Musculoskeletal-no joint tenderness, deformity or swelling Skin-warm and dry, no hyperpigmentation, vitiligo, or suspicious lesions  Neurological Examination Mental Status: Alert, oriented, thought content appropriate.  Speech fluent without evidence of aphasia.  Able to follow 3 step commands without difficulty. Cranial Nerves: II:  Discs flat bilaterally; Visual fields grossly normal, pupils equal, round, reactive to light and accommodation III,IV, VI: ptosis not present, extra-ocular motions intact bilaterally V,VII: smile symmetric, facial light touch sensation normal bilaterally VIII: hearing normal bilaterally IX,X: uvula rises symmetrically XI: bilateral shoulder shrug XII: midline tongue extension Motor: Right : Upper extremity   5/5    Left:     Upper extremity   5/5  Lower extremity   5/5     Lower extremity   5/5 Tone and bulk:normal tone throughout; no atrophy noted Sensory: Pinprick and light touch intact throughout, bilaterally Deep Tendon Reflexes: 2+ and symmetric  throughout Plantars: Right: downgoing   Left: downgoing Cerebellar: normal finger-to-nose, normal rapid alternating movements and normal heel-to-shin test Gait: Not tested      Lab Results: Basic Metabolic Panel:  Recent Labs Lab 12/24/15 0027  12/24/15 0315 12/24/15 1100 12/24/15 1300 12/24/15 1836 12/26/15 0405  NA  --   --  142  --  139 138 138  K  --   --  2.4* 3.9 3.6 3.7 4.3  CL  --   --  123*  --  109 108 106  CO2  --   --  15*  --  24 24 27   GLUCOSE  --   --  86  --  113* 108* 122*  BUN  --   --  8  --  7 8 5*  CREATININE  --   --  0.64  --  1.15 1.17 1.14  CALCIUM  --   < > 5.4*  --  8.9 8.7* 8.8*  MG 1.2*  --   --   --   --   --  1.9  PHOS 1.4*  --   --   --   --   --  4.6  < > = values in this interval not displayed.  Liver Function Tests:  Recent Labs Lab 12/24/15 0315 12/24/15 1300  AST 18 28  ALT 14* 22  ALKPHOS 43 74  BILITOT 0.2* 0.6  PROT 4.0* 7.0  ALBUMIN 2.0* 3.4*   No results for input(s): LIPASE, AMYLASE in the last 168 hours. No results for input(s): AMMONIA in the last 168 hours.  CBC:  Recent Labs Lab 12/23/15 2349 12/24/15 1300  WBC 13.0* 10.6*  NEUTROABS 9.7*  --   HGB 15.1 13.8  HCT 43.9 41.3  MCV 78.5 79.7  PLT 364 209    Cardiac Enzymes: No results for input(s): CKTOTAL, CKMB, CKMBINDEX, TROPONINI in the last 168 hours.  Lipid Panel: No results for input(s): CHOL, TRIG, HDL, CHOLHDL, VLDL, LDLCALC in the last 168 hours.  CBG:  Recent Labs Lab 12/23/15 2304 12/23/15 2353  GLUCAP 134* 179*    Microbiology: Results for orders placed or performed during the hospital encounter of 04/16/13  MRSA PCR Screening     Status: None   Collection Time: 04/16/13  5:25 PM  Result Value Ref Range Status   MRSA by PCR NEGATIVE NEGATIVE Final    Comment:        The GeneXpert MRSA Assay (FDA approved for NASAL specimens only), is one component of a comprehensive MRSA colonization surveillance program. It is  not intended to diagnose MRSA infection nor to guide or monitor treatment for MRSA infections.    Coagulation Studies: No results for input(s): LABPROT, INR in the last 72 hours.  Imaging:  CT Head 12/24/2015: IMPRESSION: No acute intracranial abnormality. Minimal ethmoid sinus mucosal thickening.   Assessment/Plan:  This is a 30 year old male with history of generalized convulsions and history of 24-hour EEG showing left temporal epileptiform discharges. Previous MRI brain was normal. Patient was admitted from the ED secondary to breakthrough seizures in the setting of hypokalemia and hypocalcemia. Primary neurologist was contacted and patient's Lamictal was increased. Patient has been seizure-free since the increase in medication. Patient is to be discharged and will follow up with primary neurologist for further evaluation and possible changes in medication. No further recommendations at this time. Neurology will sign off     Assessment and plan per attending neurologist  Felicie Mornavid Smith PA-C [facility] [phone]  12/26/2015, 12:37 PM    Personally examined patient and images, and have participated in and made any corrections needed to history, physical, neuro exam,assessment and plan as stated above.  I have personally obtained the history, evaluated lab date, reviewed imaging studies and agree with radiology interpretations. Patient has done well with increase in Lamictal, follow up outpatient.    Naomie DeanAntonia Ahern, MD [facility] [phone]

## 2015-12-28 NOTE — Telephone Encounter (Signed)
Called to follow up with patient. Wife answered stated to hold a minute but no one ever came back to phone.

## 2016-01-23 ENCOUNTER — Ambulatory Visit: Payer: Self-pay | Admitting: Neurology

## 2016-02-11 ENCOUNTER — Encounter: Payer: Self-pay | Admitting: Neurology

## 2016-02-11 ENCOUNTER — Telehealth: Payer: Self-pay | Admitting: Neurology

## 2016-02-11 NOTE — Telephone Encounter (Signed)
Peter Barr September 26, 1985. Peter Barr did have a seizure about 2 weeks ago. His # is [phone]. Peter Barr is still taking his medication. Peter Barr made a follow up appointment to see Dr. Karel JarvisAquino in May of 2018. Peter Barr is on a wait list. Thank you

## 2016-02-12 ENCOUNTER — Telehealth: Payer: Self-pay

## 2016-02-12 NOTE — Telephone Encounter (Signed)
Patient notified referral sent to [facility].

## 2016-02-13 ENCOUNTER — Other Ambulatory Visit: Payer: Self-pay

## 2016-02-13 DIAGNOSIS — G40019 Localization-related (focal) (partial) idiopathic epilepsy and epileptic syndromes with seizures of localized onset, intractable, without status epilepticus: Secondary | ICD-10-CM

## 2016-02-13 MED ORDER — LAMOTRIGINE 200 MG PO TABS: ORAL_TABLET | ORAL | 1 refills | Status: DC

## 2016-02-13 NOTE — Telephone Encounter (Signed)
How much Lamictal is he taking? Is it 200mg  BID? If yes, increase to 200mg  in AM, 300mg  in PM. I would like to see him before May 2018. Pls remind him of our no-show policy, if he misses one more appt, I am unable to see him anymore, per office policy. Thanks

## 2016-02-13 NOTE — Telephone Encounter (Signed)
Wanted Dr. Karel JarvisAquino aware.

## 2016-02-13 NOTE — Telephone Encounter (Signed)
Notified patient of below, he verbalized understanding. Sent in new RX to pharmacy.

## 2016-02-22 ENCOUNTER — Ambulatory Visit (INDEPENDENT_AMBULATORY_CARE_PROVIDER_SITE_OTHER): Payer: Medicaid Other | Admitting: Neurology

## 2016-02-22 ENCOUNTER — Encounter: Payer: Self-pay | Admitting: Neurology

## 2016-02-22 VITALS — BP 132/86 | HR 92 | Ht 71.0 in | Wt 281.2 lb

## 2016-02-22 DIAGNOSIS — R5383 Other fatigue: Secondary | ICD-10-CM

## 2016-02-22 DIAGNOSIS — G40019 Localization-related (focal) (partial) idiopathic epilepsy and epileptic syndromes with seizures of localized onset, intractable, without status epilepticus: Secondary | ICD-10-CM

## 2016-02-22 MED ORDER — ZONISAMIDE 100 MG PO CAPS: ORAL_CAPSULE | ORAL | 5 refills | Status: DC

## 2016-02-22 NOTE — Progress Notes (Signed)
NEUROLOGY FOLLOW UP OFFICE NOTE  Jamarkis Branam [id number]  HISTORY OF PRESENT ILLNESS: I had the pleasure of seeing Velma Hanna in follow-up in the neurology clinic on 02/22/2016. 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 He is again accompanied by his wife who helps supplement the history today. Since his last visit,he has had 2 GTCs, one in August, and another 2 last 12/22/15. He went to the ER the next day for this, there was note of left tongue bite, his electrolytes were abnormal with low potassium, calcium, magnesium, and phosphorus. Repeat bloodwork almost 12 hours later was normal. His wife reports staring episodes around 3 times a week where he would stare off and forget things. He has had a couple where he stares and fell/fainted. His wife reports that with the GTCs, his breathing changes after and "like something signals his spine" where he has pressure on the right side. His wife reports he had no warning with the last seizure. In the past he would start slowing down and repeating himself. He did reports head pain prior to the alst seizure, grabbed his head, then had the GTC. He had reported left-sided numbness after the seizures, he denies it today. He continues to have frequent diffuse headaches, no nausea/vomiting, some photosensitivity. His affect is flat today, he reports mood is okay. His wife states he is feeling depressed and anxious about his disability.   HPI: This is a 30 yo LH man with new onset seizures last 04/16/2013. He came home from a typical 13-hour 3rd shift, came home and recalls his wife fixing breakfast, then has no recollection of events until he was at [facility] ER. His wife reported that he was talking then suddenly tensed up and fell to his right side, with tonic-clonic activity repeatedly hitting his head on the child safety gate in their kitchen for 3-5 minutes. This was followed by  sonorous respirations. He was brought to [facility] ER where he had a second GTC after the CT head. He was given an IV load of Cerebryx, which apparently caused burning in the IV site. He was switched from Dilantin and discharged on Vimpat 100mg  BID. He had seen neurologist Dr. Terrace Arabia and reported continued to have back pain and paresthesias in both feet. MRI thoracic spine was unremarkable. He continues on meloxicam and still has the back pain. In March, he was in the ER for dizziness with sudden onset spinning sensation. He was diagnosed with vertigo and discharged home. He also reported fatigue, lack of appetite, poor balance.   He had a nocturnal convulsion in April 2015. In September 2015, he had a complex partial seizure with behavioral arrest, followed by a weird sound, "like a screech," then started moving his body back and forth, hands clenched at his sides. The episode lasted 30 seconds, followed by confusion for 2 minutes. He had no recollection of the episode and denied any warning symptoms. His wife expressed concern that he has been "strange" lately, almost daily, sometimes 1-2 times a day, he would gaze out, then ask her what she had said, lasting a minute or so. He doesn't realize he stopped talking, but she notes he is briefly confused after. He had a complex partial seizure on Christmas day 2015. He recalls feeling tired, then was unresponsive to his wife, who noticed his face was droopy, he was drooling, and had a weird taste in his mouth when he came to.  He recalls his head was spinning after. He was fine the next day.   He was at [facility] ER in July 2016 for a seizure. At that time he reported taking the Aptiom. He called our office on 11/29/14 to report a seizure and was scheduled for an urgent visit, but in the interim they started having housing issues and had to move temporarily to be with family in [place]. He reported seizures on 8/8 and 9/9, and was started on Keppra 500mg  BID in [facility] in [place]. Seizures were preceded by gustatory hallucination of a weird taste. He reported that he stopped the Aptiom in June and was only taking nortriptyline and Keppra. His last call to our office was in October 2016, then he called back last week to see if there were any appointments to review disability. He did well seizure-free for 4 months on Keppra 500mg  BID, however he feels that he is regressing. 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 His energy level is low. His wife has noticed things when he is sleeping, he would be exhausted on waking up, still with continued headaches with head pounding 2-3 hours daily with photosensitivity, no nausea or vomiting, vision off even with his glasses, numbness on his right cheek. His wife also noticed a change in his speech and walking. He reports mood is "okay," he is just tired all the time. He reports olfactory hallucinations ("sour smell") once a week. His vision is so blurred that he needs help when he stays with the children. He reports falling often, his right cheek would go numb, then his right leg feels numb and he would fall.   Epilepsy Risk Factors: His father had febrile convulsions as a child. Otherwise he had a normal birth and early development. There is no history of febrile convulsions, CNS infections such as meningitis/encephalitis, significant traumatic brain injury, neurosurgical procedures, or family history of seizures.  Diagnostic Data: I personally reviewed MRI brain with and without contrast which was normal, hippocampi symmetric with no abnormal signal or enhancement seen. Routine EEG reported as normal. 24-hour EEG showed occasional focal left temporal slowing and left temporal epileptiform discharges in sleep. No typical events captured.  Prior AEDs: Dilantin,  Vimpat, Aptiom, Keppra  PAST MEDICAL HISTORY: Past Medical History:  Diagnosis Date  . Anxiety   . Complication of anesthesia    " one time I had a convulsion "  . Hypertension   . Migraine   . Seizures [facility])     MEDICATIONS:  Outpatient Encounter Prescriptions as of 02/22/2016  Medication Sig  . acetaminophen (TYLENOL) 500 MG tablet Take 1,000 mg by mouth every 8 (eight) hours as needed for mild pain.   Marland Kitchen lamoTRIgine (LAMICTAL) 200 MG tablet Take 200mg  in AM and 300mg  PM  . methocarbamol (ROBAXIN) 500 MG tablet Take 1 tablet (500 mg total) by mouth 2 (two) times daily as needed for muscle spasms.  . naphazoline-pheniramine (NAPHCON-A) 0.025-0.3 % ophthalmic solution Place 1-2 drops into both eyes 4 (four) times daily as needed for irritation or allergies.       No facility-administered encounter medications on file as of 02/22/2016.     ALLERGIES: Allergies  Allergen Reactions  . Depakote Er [Divalproex Sodium Er] Other (See Comments)    Caused burns on arm  .  Dilantin [Phenytoin Sodium Extended] Other (See Comments)    Caused burns on arm   . Adhesive [Tape] Itching and Rash    Heart monitor sticky pads (leads)  . Tramadol Palpitations    Spasms, dizzy (also)    FAMILY HISTORY: Family History  Problem Relation Age of Onset  . Hypertension Father   . Colon cancer Maternal Grandfather     SOCIAL HISTORY: Social History   Social History  . Marital status: Married    Spouse name: [name]  . Number of children: 3  . Years of education: college   [place]upational [place]  [employer] [place]   Social History Main Topics  . Smoking status: Never Smoker  . Smokeless tobacco: Never Used  . Alcohol use Yes     Comment: occasionally  . Drug use: No  . Sexual activity: Not on file   Other Topics Concern  . Not on file   Social History Narrative   Patient lives at home with his wife [name]([name]) Patient works Full time out of work on leave.    Education some  college   Left handed   Caffeine None    REVIEW OF SYSTEMS: Constitutional: No fevers, chills, or sweats, + generalized fatigue, change in appetite Eyes: No visual changes, double vision, eye pain Ear, nose and throat: No hearing loss, ear pain, nasal congestion, sore throat Cardiovascular: No chest pain, palpitations Respiratory:  No shortness of breath at rest or with exertion, wheezes GastrointestinaI: No nausea, vomiting, diarrhea, abdominal pain, fecal incontinence Genitourinary:  No dysuria, urinary retention or frequency Musculoskeletal:  + neck pain, back pain Integumentary: No rash, pruritus, skin lesions Neurological: as above Psychiatric: No depression, insomnia, anxiety Endocrine: No palpitations, fatigue, diaphoresis, mood swings, change in appetite, change in weight, increased thirst Hematologic/Lymphatic:  No anemia, purpura, petechiae. Allergic/Immunologic: no itchy/runny eyes, nasal congestion, recent allergic reactions, rashes  PHYSICAL EXAM: Vitals:   02/22/16 1138  BP: 132/86  Pulse: 92   General: No acute distress, flat affect Head:  Normocephalic/atraumatic Neck: supple, no paraspinal tenderness, full range of motion Heart:  Regular rate and rhythm Lungs:  Clear to auscultation bilaterally Back: No paraspinal tenderness Skin/Extremities: No rash, no edema Neurological Exam: alert and oriented to person, place, and time. No aphasia or dysarthria. Attention and concentration are impaired. Able to name objects and repeat phrases. Cranial nerves: Pupils equal, round, reactive to light. Extraocular movements intact with no nystagmus. Visual fields full. Facial sensation intact. No facial asymmetry. Tongue, uvula, palate midline.  Motor: Bulk and tone normal, muscle strength 5/5 throughout with no pronator drift.  Sensation to light touch intact.  No extinction to double simultaneous stimulation.  Deep tendon reflexes 2+ throughout, toes downgoing.  Finger to nose  testing intact.  Gait narrow-based and steady, no ataxia.  IMPRESSION: This is a 30 yo LH man with a new onset generalized convulsions last February 2015 when he had 2 convulsions in one day. His 24-hour EEG showed left temporal epileptiform discharges, MRI brain normal. 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 He continues to have focal seizures with impaired awareness around three times a week, and had a GTC in August and October. He is now on Lamotrigine 200mg  in Am, 300mg  in PM. His wife continues to witness staring spells. We will add on zonisamide 100mg  qhs x 2 weeks, then uptitrate every 2 weeks to 300mg  qhs. Side effects were discussed. This may help with the headaches as well.  He continues to feel fatigued with poor energy, check CBC, CMP, TSH, B12, vitamin D levels. Lamictal level will be done as well. On his last visit, we discussed a referral to [facility] for consideration for presurgical evaluation. He would like to proceed, referral has been sent. We discussed epilepsy surgery and typical workup. We again discussed [place] driving laws that indicate one should not drive until 6 months seizure-free. He will follow-up in 3 months and knows to call for any problems.  Thank you for allowing me to participate in his care.  Please do not hesitate to call for any questions or concerns.  The duration of this appointment visit was 25 minutes of face-to-face time with the patient.  Greater than 50% of this time was spent in counseling, explanation of diagnosis, planning of further management, and coordination of care.   Patrcia DollyKaren Bridey Brookover, M.D.   CC: Dr. Clovis RileyMitchell

## 2016-02-22 NOTE — Patient Instructions (Addendum)
1. Bloodwork for CBC, CMP, vitamin D, vitamin B12, TSH, Lamictal level 2. Start Zonisamide 100mg : Take 1 capsule at night for 2 weeks, then increase to 2 capsules at night for 2 weeks, then increase to 3 capsules at night and continue 3. Continue Lamictal 200mg  in AM, 300mg  in PM 4. Follow-up in 3 months, proceed with [facility] consult  Seizure Precautions: 1. If medication has been prescribed for you to prevent seizures, take it exactly as directed.  Do not stop taking the medicine without talking to your doctor first, even if you have not had a seizure in a long time.   2. Avoid activities in which a seizure would cause danger to yourself or to others.  Don't operate dangerous machinery, swim alone, or climb in high or dangerous places, such as on ladders, roofs, or girders.  Do not drive unless your doctor says you may.  3. If you have any warning that you may have a seizure, lay down in a safe place where you can't hurt yourself.    4.  No driving for 6 months from last seizure, as [place].   Please refer to the following link on the Epilepsy Foundation of America's website for more information: http://www.epilepsyfoundation.org/answerplace/Social/driving/drivingu.cfm   5.  Maintain good sleep hygiene. Avoid alcohol.  6.  Contact your doctor if you have any problems that may be related to the medicine you are taking.  7.  Call 911 and bring the patient back to the ED if:        A.  The seizure lasts longer than 5 minutes.       B.  The patient doesn't awaken shortly after the seizure  C.  The patient has new problems such as difficulty seeing, speaking or moving  D.  The patient was injured during the seizure  E.  The patient has a temperature over 102 F (39C)  F.  The patient vomited and now is having trouble breathing

## 2016-02-25 ENCOUNTER — Telehealth: Payer: Self-pay | Admitting: Neurology

## 2016-02-25 NOTE — Telephone Encounter (Signed)
Contacted [facility]. They requested patients notes sent to  [phone]. They wanted us aware it can take 7-10 business days for their providers to review patient's referral and if it is denied then Dr. Karel JarvisAquino can then request a pier to pier. They state patient has been called and notified.

## 2016-02-25 NOTE — Telephone Encounter (Signed)
[facility] called in regards to the referral that was sent to them for PT and would like a call back at [phone]/Dawn

## 2016-02-26 ENCOUNTER — Encounter: Payer: Self-pay | Admitting: Neurology

## 2016-02-26 ENCOUNTER — Other Ambulatory Visit (INDEPENDENT_AMBULATORY_CARE_PROVIDER_SITE_OTHER): Payer: Medicaid Other

## 2016-02-26 DIAGNOSIS — R5383 Other fatigue: Secondary | ICD-10-CM

## 2016-02-26 DIAGNOSIS — G40019 Localization-related (focal) (partial) idiopathic epilepsy and epileptic syndromes with seizures of localized onset, intractable, without status epilepticus: Secondary | ICD-10-CM | POA: Diagnosis not present

## 2016-02-26 LAB — COMPREHENSIVE METABOLIC PANEL
ALT: 22 U/L (ref 0–53)
AST: 19 U/L (ref 0–37)
Albumin: 4.4 g/dL (ref 3.5–5.2)
Alkaline Phosphatase: 83 U/L (ref 39–117)
BUN: 12 mg/dL (ref 6–23)
CO2: 28 meq/L (ref 19–32)
Calcium: 9.6 mg/dL (ref 8.4–10.5)
Chloride: 105 mEq/L (ref 96–112)
Creatinine, Ser: 1.17 mg/dL (ref 0.40–1.50)
GFR: 95.07 mL/min (ref >60.00)
GLUCOSE: 100 mg/dL — AB (ref 70–99)
POTASSIUM: 3.8 meq/L (ref 3.5–5.1)
SODIUM: 141 meq/L (ref 135–145)
TOTAL PROTEIN: 7.8 g/dL (ref 6.0–8.3)
Total Bilirubin: 0.3 mg/dL (ref 0.2–1.2)

## 2016-02-26 LAB — CBC WITH DIFFERENTIAL/PLATELET
BASOS ABS: 0.1 10*3/uL (ref 0.0–0.1)
Basophils Relative: 0.5 % (ref 0.0–3.0)
Eosinophils Absolute: 0.2 10*3/uL (ref 0.0–0.7)
Eosinophils Relative: 2.2 % (ref 0.0–5.0)
HCT: 46 % (ref 39.0–52.0)
Hemoglobin: 15.6 g/dL (ref 13.0–17.0)
LYMPHS ABS: 2.2 10*3/uL (ref 0.7–4.0)
Lymphocytes Relative: 23.2 % (ref 12.0–46.0)
MCHC: 33.9 g/dL (ref 30.0–36.0)
MCV: 80.4 fl (ref 78.0–100.0)
MONO ABS: 0.9 10*3/uL (ref 0.1–1.0)
MONOS PCT: 8.9 % (ref 3.0–12.0)
NEUTROS ABS: 6.3 10*3/uL (ref 1.4–7.7)
NEUTROS PCT: 65.2 % (ref 43.0–77.0)
PLATELETS: 253 10*3/uL (ref 150.0–400.0)
RBC: 5.71 Mil/uL (ref 4.22–5.81)
RDW: 13.8 % (ref 11.5–15.5)
WBC: 9.7 10*3/uL (ref 4.0–10.5)

## 2016-02-26 LAB — TSH: TSH: 0.98 u[IU]/mL (ref 0.35–4.50)

## 2016-02-26 LAB — VITAMIN D 25 HYDROXY (VIT D DEFICIENCY, FRACTURES): VITD: 15.57 ng/mL — ABNORMAL LOW (ref 30.00–100.00)

## 2016-02-26 LAB — VITAMIN B12: Vitamin B-12: 355 pg/mL (ref 211–911)

## 2016-02-27 ENCOUNTER — Telehealth: Payer: Self-pay

## 2016-02-27 ENCOUNTER — Other Ambulatory Visit: Payer: Self-pay

## 2016-02-27 MED ORDER — VITAMIN D (ERGOCALCIFEROL) 1.25 MG (50000 UNIT) PO CAPS: ORAL_CAPSULE | ORAL | 0 refills | Status: DC

## 2016-02-27 NOTE — Telephone Encounter (Signed)
-----   Message from Van ClinesKaren M [name], MD sent at 02/27/2016  3:12 PM EST ----- Pls let him know his vitamin D level is low, this is likely why he is feeling tired all the time. Start vitamin D 50,000 IU capsule once a week for 8 weeks, then take daily over the counter calcium with vitamin D after. Also discuss with his PCP, thanks

## 2016-02-27 NOTE — Telephone Encounter (Signed)
Patient notified, verbalized understanding. RX sent to pharmacy.  

## 2016-02-28 LAB — LAMOTRIGINE LEVEL: Lamotrigine Lvl: 7.6 ug/mL (ref 4.0–18.0)

## 2016-02-29 ENCOUNTER — Telehealth: Payer: Self-pay

## 2016-02-29 NOTE — Telephone Encounter (Signed)
[facility] called and state they have patient approved for a consult appointment only and follow-up care will have to be done here.

## 2016-03-11 ENCOUNTER — Other Ambulatory Visit: Payer: Self-pay | Admitting: Neurology

## 2016-03-11 ENCOUNTER — Other Ambulatory Visit: Payer: Self-pay

## 2016-03-11 MED ORDER — CYCLOBENZAPRINE HCL 5 MG PO TABS: 5.0000 mg | ORAL_TABLET | Freq: Three times a day (TID) | ORAL | 0 refills | Status: DC | PRN

## 2016-03-11 MED ORDER — FLUTICASONE PROPIONATE 50 MCG/ACT NA SUSP: 1.0000 | Freq: Every day | NASAL | 0 refills | Status: DC

## 2016-03-11 NOTE — Telephone Encounter (Signed)
pls ask him if he is still taking it, because it was discontinued by another doc. thanks

## 2016-03-11 NOTE — Telephone Encounter (Signed)
Flexeril 5mg  refill request. RX was discontinued on 12/24/15 by Dr. Robb Matarrtiz. Last refilled on 05/04/15. Okay to refill?

## 2016-03-20 ENCOUNTER — Encounter (HOSPITAL_COMMUNITY): Payer: Self-pay | Admitting: Family Medicine

## 2016-03-20 ENCOUNTER — Ambulatory Visit (HOSPITAL_COMMUNITY)
Admission: EM | Admit: 2016-03-20 | Discharge: 2016-03-20 | Disposition: A | Discharge status: Home / Self Care | Payer: Medicaid Other | Attending: Emergency Medicine | Admitting: Emergency Medicine

## 2016-03-20 DIAGNOSIS — H66001 Acute suppurative otitis media without spontaneous rupture of ear drum, right ear: Secondary | ICD-10-CM | POA: Diagnosis not present

## 2016-03-20 MED ORDER — AMOXICILLIN-POT CLAVULANATE 875-125 MG PO TABS: 1.0000 | ORAL_TABLET | Freq: Two times a day (BID) | ORAL | 0 refills | Status: AC

## 2016-03-20 MED ORDER — ALBUTEROL SULFATE HFA 108 (90 BASE) MCG/ACT IN AERS: 1.0000 | INHALATION_SPRAY | Freq: Four times a day (QID) | RESPIRATORY_TRACT | 0 refills | Status: DC | PRN

## 2016-03-20 MED ORDER — BENZONATATE 100 MG PO CAPS: 100.0000 mg | ORAL_CAPSULE | Freq: Three times a day (TID) | ORAL | 0 refills | Status: DC

## 2016-03-20 NOTE — Discharge Instructions (Signed)
You have an ear infection in your right ear. I am writing a prescription of Augmentin, take 1 tablet twice a day for 7 days, I am also writing for Tessalon 3 times a day as needed for cough. You may take Mucinex over the counter twice a day with water, and flonase 2 spays each nostril twice a day for congestion and to help facilitate drainage of your ears. Should your symptoms fail to resolve or worsen follow up with your primary care provider or return to clinic.

## 2016-03-20 NOTE — ED Provider Notes (Signed)
CSN: [id number]     Arrival date & time 03/20/16  4098 History   None    Chief Complaint  Patient presents with  . Nasal Congestion  . Otalgia   (Consider location/radiation/quality/duration/timing/severity/associated sxs/prior Treatment) 31 year old male presents to clinic with 5 day history of cough, congestion, sinus pain and pressure, ear pain, and dizziness. He reports he has had some shortness of breath and wheezing as well. He is not asthmatic, does not smoke. Has no nausea, vomiting, or diarrhea. No change in appetite, no fatigue.   The history is provided by the patient.  Otalgia    Past Medical History:  Diagnosis Date  . Anxiety   . Complication of anesthesia    " one time I had a convulsion "  . Hypertension   . Migraine   . Seizures (HCC)    Past Surgical History:  Procedure Laterality Date  . KNEE SURGERY Right    Family History  Problem Relation Age of Onset  . Hypertension Father   . Colon cancer Maternal Grandfather    Social History  Substance Use Topics  . Smoking status: Never Smoker  . Smokeless tobacco: Never Used  . Alcohol use Yes     Comment: occasionally    Review of Systems  Reason unable to perform ROS: As covered in HPI.  HENT: Positive for ear pain.   All other systems reviewed and are negative.   Allergies  Depakote er [divalproex sodium er]; Dilantin [phenytoin sodium extended]; Adhesive [tape]; and Tramadol  Home Medications   Prior to Admission medications   Medication Sig Start Date End Date Taking? Authorizing Provider  acetaminophen (TYLENOL) 500 MG tablet Take 1,000 mg by mouth every 8 (eight) hours as needed for mild pain.     Historical Provider, MD  amoxicillin-clavulanate (AUGMENTIN) 875-125 MG tablet Take 1 tablet by mouth 2 (two) times daily. 03/20/16 03/27/16  Dorena Bodo, NP  benzonatate (TESSALON) 100 MG capsule Take 1 capsule (100 mg total) by mouth every 8 (eight) hours. 03/20/16   Dorena Bodo, NP   cyclobenzaprine (FLEXERIL) 5 MG tablet Take 1 tablet (5 mg total) by mouth every 8 (eight) hours as needed for muscle spasms. 03/11/16   Van Clines, MD  fluticasone (FLONASE) 50 MCG/ACT nasal spray Place 1 spray into both nostrils daily. 03/11/16   Van Clines, MD  lamoTRIgine (LAMICTAL) 200 MG tablet Take 200mg  in AM and 300mg  PM 02/13/16   Van Clines, MD  methocarbamol (ROBAXIN) 500 MG tablet Take 1 tablet (500 mg total) by mouth 2 (two) times daily as needed for muscle spasms. 12/26/15   Filbert Schilder, MD  naphazoline-pheniramine (NAPHCON-A) 0.025-0.3 % ophthalmic solution Place 1-2 drops into both eyes 4 (four) times daily as needed for irritation or allergies.    Historical Provider, MD  Vitamin D, Ergocalciferol, (DRISDOL) 50000 units CAPS capsule Take one capsule once a week for 8 weeks. 02/27/16   Van Clines, MD  zonisamide (ZONEGRAN) 100 MG capsule Take 1 capsule at night for 2 weeks, then increase to 2 capsules at night for 2 weeks, then increase to 3 capsules at night and continue 02/22/16   Van Clines, MD   Meds Ordered and Administered this Visit  Medications - No data to display  There were no vitals taken for this visit. No data found.   Physical Exam  Constitutional: He is oriented to person, place, and time. He appears well-developed and well-nourished. No distress.  HENT:  Head: Normocephalic.  Right Ear: External ear normal. There is swelling. Tympanic membrane is injected, erythematous and bulging.  Left Ear: Tympanic membrane and external ear normal.  Nose: Right sinus exhibits maxillary sinus tenderness and frontal sinus tenderness. Left sinus exhibits no maxillary sinus tenderness and no frontal sinus tenderness.  Mouth/Throat: Oropharynx is clear and moist.  Eyes: Pupils are equal, round, and reactive to light.  Neck: Normal range of motion.  Cardiovascular: Normal rate and regular rhythm.   Pulmonary/Chest: Effort normal. He has no decreased  breath sounds. He has no wheezes. He has rhonchi in the right middle field. He has no rales.  Abdominal: Soft. Bowel sounds are normal.  Lymphadenopathy:    He has no cervical adenopathy.  Neurological: He is alert and oriented to person, place, and time.  Skin: Skin is warm and dry. Capillary refill takes less than 2 seconds. No rash noted. He is not diaphoretic. No erythema.  Psychiatric: He has a normal mood and affect.  Nursing note and vitals reviewed.   Urgent Care Course   Clinical Course     Procedures (including critical care time)  Labs Review Labs Reviewed - No data to display  Imaging Review No results found.   Visual Acuity Review  Right Eye Distance:   Left Eye Distance:   Bilateral Distance:    Right Eye Near:   Left Eye Near:    Bilateral Near:         MDM   1. Acute suppurative otitis media of right ear without spontaneous rupture of tympanic membrane, recurrence not specified   You have an ear infection in your right ear. I am writing a prescription of Augmentin, take 1 tablet twice a day for 7 days, I am also writing for Tessalon 3 times a day as needed for cough. You may take Mucinex over the counter twice a day with water, and flonase 2 spays each nostril twice a day for congestion and to help facilitate drainage of your ears. I have also written a prescription for an inhaler for wheezing and shortness of breath.  Should your symptoms fail to resolve or worsen follow up with your primary care provider or return to clinic.      Dorena BodoLawrence Rajon Bisig, NP 03/20/16 2047

## 2016-03-20 NOTE — ED Triage Notes (Signed)
Pt here for nasal congestion and ear popping and fulness x 5 days. sts also cough.

## 2016-04-03 ENCOUNTER — Telehealth: Payer: Self-pay | Admitting: Neurology

## 2016-04-03 NOTE — Telephone Encounter (Signed)
Peter Barr Aug 23, 1985 and his wife Peter Barr came by the office to request his 12/20/13 24 hour EEG results to be [facility] to [facility]. He did fill out a medical release form.  His # [phone]. Thank you.

## 2016-04-04 NOTE — Telephone Encounter (Signed)
Notified patient EEG results faxed as requested.

## 2016-04-29 ENCOUNTER — Other Ambulatory Visit: Payer: Self-pay | Admitting: Neurology

## 2016-05-22 ENCOUNTER — Ambulatory Visit: Payer: Medicaid Other | Admitting: Neurology

## 2016-05-26 ENCOUNTER — Telehealth: Payer: Self-pay

## 2016-05-26 NOTE — Telephone Encounter (Signed)
-----   Message from Van ClinesKaren M [name], MD sent at 05/26/2016 10:53 AM EDT ----- Regarding: pls f/u Pls let him know we are just checking on him, he was [facility] to check in on how he is doing, has he spoken to Dr. Sherlean FootSinha yet? Thanks

## 2016-05-26 NOTE — Telephone Encounter (Signed)
Clld pt - Pt states he will be calling [facility] this week to schedule follow up appt w/ Dr. Sherlean FootSinha.    Pt states seizures have gotten better since increase in Zonisamide medication. Pt states he gets very drained and lethargic on medication and would like to know if there are other options.  Advsd I would forward note to provider to advise on options.

## 2016-07-21 ENCOUNTER — Ambulatory Visit: Payer: Medicaid Other | Admitting: Neurology

## 2016-08-25 ENCOUNTER — Other Ambulatory Visit: Payer: Self-pay | Admitting: Neurology

## 2016-08-25 DIAGNOSIS — G40019 Localization-related (focal) (partial) idiopathic epilepsy and epileptic syndromes with seizures of localized onset, intractable, without status epilepticus: Secondary | ICD-10-CM

## 2016-09-09 ENCOUNTER — Ambulatory Visit: Payer: Medicaid Other | Admitting: Neurology

## 2016-09-17 ENCOUNTER — Encounter: Payer: Self-pay | Admitting: Neurology

## 2016-09-17 ENCOUNTER — Ambulatory Visit (INDEPENDENT_AMBULATORY_CARE_PROVIDER_SITE_OTHER): Payer: Medicaid Other | Admitting: Neurology

## 2016-09-17 ENCOUNTER — Other Ambulatory Visit: Payer: Self-pay | Admitting: Neurology

## 2016-09-17 VITALS — BP 152/92 | HR 80 | Ht 71.0 in | Wt 274.0 lb

## 2016-09-17 DIAGNOSIS — G40019 Localization-related (focal) (partial) idiopathic epilepsy and epileptic syndromes with seizures of localized onset, intractable, without status epilepticus: Secondary | ICD-10-CM

## 2016-09-17 DIAGNOSIS — F419 Anxiety disorder, unspecified: Secondary | ICD-10-CM

## 2016-09-17 NOTE — Progress Notes (Signed)
NEUROLOGY FOLLOW UP OFFICE NOTE  Peter Barr [id number]  HISTORY OF PRESENT ILLNESS: I had the pleasure of seeing Peter Barr in follow-up in the neurology clinic on 09/17/2016. 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 He is again accompanied by his wife who helps supplement the history today. Since his last visit, [facility] by epileptologist Dr. Sherlean FootSinha, with plans for video EEG monitoring tomorrow. He and his wife report that the last convulsion was several months ago (?October 2017 is our last recorded GTC, they cannot recall exact date). His wife however reports that he continues to have frequent staring spells, last week he had them almost daily, sometimes two in a day. He is now on Zonisamide 400mg  qhs in addition to Lamotrigine 200mg  in AM, 300mg  in PM. He reports feeling dizzy with frequent headaches, as well as frequent congestion. His wife expressed concern about his mood, she feels he has been more anxious since the seizures started, he seems unaware of this but has been worrying a lot about their young children and him being out of work.   HPI: This is a 31 yo LH man with new onset seizures last 04/16/2013. He came home from a typical 13-hour 3rd shift, came home and recalls his wife fixing breakfast, then has no recollection of events until [facility]. His wife reported that he was talking then suddenly tensed up and fell to his right side, with tonic-clonic activity repeatedly hitting his head on the child safety gate in their kitchen for 3-5 minutes. This was followed by sonorous respirations. [facility] was [facility] where he had a second GTC after the CT head. He was given an IV load of Cerebryx, which apparently caused burning in the IV site. He was switched from Dilantin and discharged on Vimpat 100mg  BID. He had seen neurologist Dr. Terrace ArabiaYan and reported continued to have back pain  and paresthesias in both feet. MRI thoracic spine was unremarkable. He continues on meloxicam and still has the back pain. In March, he was in the ER for dizziness with sudden onset spinning sensation. He was diagnosed with vertigo and discharged home. He also reported fatigue, lack of appetite, poor balance.   He had a nocturnal convulsion in April 2015. In September 2015, he had a complex partial seizure with behavioral arrest, followed by a weird sound, "like a screech," then started moving his body back and forth, hands clenched at his sides. The episode lasted 30 seconds, followed by confusion for 2 minutes. He had no recollection of the episode and denied any warning symptoms. His wife expressed concern that he has been "strange" lately, almost daily, sometimes 1-2 times a day, he would gaze out, then ask her what she had said, lasting a minute or so. He doesn't realize he stopped talking, but she notes he is briefly confused after. He had a complex partial seizure on Christmas day 2015. He recalls feeling tired, then was unresponsive to his wife, who noticed his face was droopy, he was drooling, and had a weird taste in his mouth when he came to. He recalls his head was spinning after. He was fine the next day.   [facility] July 2016 for a seizure. At that time he reported taking the Aptiom. He called our office on 11/29/14 to report a seizure and was scheduled for an urgent visit,  but in the interim they started having housing issues and had to move temporarily to be with family in [place]. He reported seizures on 8/8 and 9/9, and was started on Keppra 500mg  BID in [facility] in [place]. Seizures were preceded by gustatory hallucination of a weird taste. He reported that he stopped the Aptiom in June and was only taking nortriptyline and Keppra. His last call to our office was in October 2016, then he called back last week to see if there were any appointments to review  disability. He did well seizure-free for 4 months on Keppra 500mg  BID, however he feels that he is regressing. 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 His energy level is low. His wife has noticed things when he is sleeping, he would be exhausted on waking up, still with continued headaches with head pounding 2-3 hours daily with photosensitivity, no nausea or vomiting, vision off even with his glasses, numbness on his right cheek. His wife also noticed a change in his speech and walking. He reports mood is "okay," he is just tired all the time. He reports olfactory hallucinations ("sour smell") once a week. His vision is so blurred that he needs help when he stays with the children. He reports falling often, his right cheek would go numb, then his right leg feels numb and he would fall.   Epilepsy Risk Factors: His father had febrile convulsions as a child. Otherwise he had a normal birth and early development. There is no history of febrile convulsions, CNS infections such as meningitis/encephalitis, significant traumatic brain injury, neurosurgical procedures, or family history of seizures.  Diagnostic Data: I personally reviewed MRI brain with and without contrast which was normal, hippocampi symmetric with no abnormal signal or enhancement seen. Routine EEG reported as normal. 24-hour EEG showed occasional focal left temporal slowing and left temporal epileptiform discharges in sleep. No typical events captured.  Prior AEDs: Dilantin, Vimpat, Aptiom, Keppra  PAST MEDICAL HISTORY: Past Medical History:  Diagnosis Date  . Anxiety   . Complication of anesthesia    " one time I had a convulsion "  . Hypertension   . Migraine   . Seizures [facility])     MEDICATIONS:  Outpatient Encounter Prescriptions as of 09/17/2016  Medication Sig  .  acetaminophen (TYLENOL) 500 MG tablet Take 1,000 mg by mouth every 8 (eight) hours as needed for mild pain.   Marland Kitchen albuterol (PROVENTIL HFA;VENTOLIN HFA) 108 (90 Base) MCG/ACT inhaler Inhale 1-2 puffs into the lungs every 6 (six) hours as needed for wheezing or shortness of breath.  . benzonatate (TESSALON) 100 MG capsule Take 1 capsule (100 mg total) by mouth every 8 (eight) hours.  . cyclobenzaprine (FLEXERIL) 5 MG tablet Take 1 tablet (5 mg total) by mouth every 8 (eight) hours as needed for muscle spasms.  . fluticasone (FLONASE) 50 MCG/ACT nasal spray Place 1 spray into both nostrils daily.  Marland Kitchen lamoTRIgine (LAMICTAL) 200 MG tablet Take 200mg  in AM and 300mg  PM  . methocarbamol (ROBAXIN) 500 MG tablet Take 1 tablet (500 mg total) by mouth 2 (two) times daily as needed for muscle spasms.  . naphazoline-pheniramine (NAPHCON-A) 0.025-0.3 % ophthalmic solution Place 1-2 drops into both eyes 4 (four) times daily as needed for irritation or allergies.  . Vitamin D, Ergocalciferol, (DRISDOL)  50000 units CAPS capsule Take one capsule once a week for 8 weeks.  Marland Kitchen zonisamide (ZONEGRAN) 100 MG capsule Taking 4 capsules at night   No facility-administered encounter medications on file as of 09/17/2016.      ALLERGIES: Allergies  Allergen Reactions  . Depakote Er [Divalproex Sodium Er] Other (See Comments)    Caused burns on arm  . Dilantin [Phenytoin Sodium Extended] Other (See Comments)    Caused burns on arm   . Adhesive [Tape] Itching and Rash    Heart monitor sticky pads (leads)  . Tramadol Palpitations    Spasms, dizzy (also)    FAMILY HISTORY: Family History  Problem Relation Age of Onset  . Hypertension Father   . Colon cancer Maternal Grandfather     SOCIAL HISTORY: Social History   Social History  . Marital status: Married    Spouse name: [name]  . Number of children: 3  . Years of education: college   Occupational History  .  [employer]   Social History Main Topics    . Smoking status: Never Smoker  . Smokeless tobacco: Never Used  . Alcohol use Yes     Comment: occasionally  . Drug use: No  . Sexual activity: Not on file   Other Topics Concern  . Not on file   Social History Narrative   Patient lives at home with his wife [name]) Patient works Full time out of work on leave.    Education some college   Left handed   Caffeine None    REVIEW OF SYSTEMS: Constitutional: No fevers, chills, or sweats, + generalized fatigue, change in appetite Eyes: No visual changes, double vision, eye pain Ear, nose and throat: No hearing loss, ear pain, nasal congestion, sore throat Cardiovascular: No chest pain, palpitations Respiratory:  No shortness of breath at rest or with exertion, wheezes GastrointestinaI: No nausea, vomiting, diarrhea, abdominal pain, fecal incontinence Genitourinary:  No dysuria, urinary retention or frequency Musculoskeletal:  + neck pain, back pain Integumentary: No rash, pruritus, skin lesions Neurological: as above Psychiatric: No depression, insomnia, anxiety Endocrine: No palpitations, fatigue, diaphoresis, mood swings, change in appetite, change in weight, increased thirst Hematologic/Lymphatic:  No anemia, purpura, petechiae. Allergic/Immunologic: no itchy/runny eyes, nasal congestion, recent allergic reactions, rashes  PHYSICAL EXAM: There were no vitals filed for this visit. General: No acute distress, flat affect Head:  Normocephalic/atraumatic Neck: supple, no paraspinal tenderness, full range of motion Heart:  Regular rate and rhythm Lungs:  Clear to auscultation bilaterally Back: No paraspinal tenderness Skin/Extremities: No rash, no edema Neurological Exam: alert and oriented to person, place, and time. No aphasia or dysarthria. Attention and concentration are impaired. Able to name objects and repeat phrases. Cranial nerves: Pupils equal, round, reactive to light. Extraocular movements intact with no nystagmus.  Visual fields full. Facial sensation intact. No facial asymmetry. Tongue, uvula, palate midline.  Motor: Bulk and tone normal, muscle strength 5/5 throughout with no pronator drift.  Sensation to light touch intact.  No extinction to double simultaneous stimulation.  Deep tendon reflexes 2+ throughout, toes downgoing.  Finger to nose testing intact.  Gait narrow-based and steady, no ataxia.  IMPRESSION: This is a 31 yo LH man with a new onset generalized convulsions last February 2015 when he had 2 convulsions in one day. His 24-hour EEG showed left temporal epileptiform discharges, MRI brain normal. 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 Last GTC ?October 2017 (they are unsure), but he  continues to have focal seizures with impaired awareness multiple times a week. He is scheduled for phase I video EEG monitoring at [facility] tomorrow, continue all medications. His wife is also concerned about anxiety and mood, he agrees to see Behavioral Medicine for psychiatry and therapy. We again discussed  driving laws that indicate one should not drive until 6 months seizure-free. He will follow-up in 3 months and knows to call for any problems.  Thank you for allowing me to participate in his care.  Please do not hesitate to call for any questions or concerns.  The duration of this appointment visit was 25 minutes of face-to-face time with the patient.  Greater than 50% of this time was spent in counseling, explanation of diagnosis, planning of further management, and coordination of care.   Patrcia Dolly, M.D.   CC: Dr. [name]

## 2016-09-17 NOTE — Patient Instructions (Signed)
1. Proceed with video EEG testing 2. Continue all your medications 3. Follow-up in 3 months, call for any changes  Seizure Precautions: 1. If medication has been prescribed for you to prevent seizures, take it exactly as directed.  Do not stop taking the medicine without talking to your doctor first, even if you have not had a seizure in a long time.   2. Avoid activities in which a seizure would cause danger to yourself or to others.  Don't operate dangerous machinery, swim alone, or climb in high or dangerous places, such as on ladders, roofs, or girders.  Do not drive unless your doctor says you may.  3. If you have any warning that you may have a seizure, lay down in a safe place where you can't hurt yourself.    4.  No driving for 6 months from last seizure, [place] per [place].   Please refer to the following link on the Epilepsy Foundation of America's website for more information: http://www.epilepsyfoundation.org/answerplace/Social/driving/drivingu.cfm   5.  Maintain good sleep hygiene. Avoid alcohol.  6.  Contact your doctor if you have any problems that may be related to the medicine you are taking.  7.  Call 911 and bring the patient back to the ED if:        A.  The seizure lasts longer than 5 minutes.       B.  The patient doesn't awaken shortly after the seizure  C.  The patient has new problems such as difficulty seeing, speaking or moving  D.  The patient was injured during the seizure  E.  The patient has a temperature over 102 F (39C)  F.  The patient vomited and now is having trouble breathing

## 2016-09-18 ENCOUNTER — Encounter: Payer: Self-pay | Admitting: Neurology

## 2016-09-19 ENCOUNTER — Encounter: Payer: Self-pay | Admitting: Neurology

## 2016-10-20 ENCOUNTER — Other Ambulatory Visit: Payer: Self-pay | Admitting: Neurology

## 2016-10-20 DIAGNOSIS — G40019 Localization-related (focal) (partial) idiopathic epilepsy and epileptic syndromes with seizures of localized onset, intractable, without status epilepticus: Secondary | ICD-10-CM

## 2017-06-09 ENCOUNTER — Other Ambulatory Visit: Payer: Self-pay | Admitting: Neurology

## 2017-06-09 DIAGNOSIS — G40019 Localization-related (focal) (partial) idiopathic epilepsy and epileptic syndromes with seizures of localized onset, intractable, without status epilepticus: Secondary | ICD-10-CM

## 2017-06-16 ENCOUNTER — Other Ambulatory Visit: Payer: Self-pay | Admitting: Neurology

## 2017-06-17 ENCOUNTER — Other Ambulatory Visit: Payer: Self-pay | Admitting: Neurology

## 2017-06-18 ENCOUNTER — Encounter (HOSPITAL_COMMUNITY): Payer: Self-pay | Admitting: Certified Registered Nurse Anesthetist

## 2017-06-18 MED ORDER — CEFAZOLIN SODIUM 10 G IJ SOLR
3.0000 g | INTRAMUSCULAR | Status: DC
  Filled 2017-06-18: qty 3000

## 2017-06-19 ENCOUNTER — Encounter (HOSPITAL_COMMUNITY): Admission: RE | Payer: Self-pay | Source: Ambulatory Visit

## 2017-06-19 ENCOUNTER — Ambulatory Visit (HOSPITAL_COMMUNITY): Admission: RE | Admit: 2017-06-19 | Payer: Medicaid Other | Source: Ambulatory Visit | Admitting: Oral Surgery

## 2017-06-19 SURGERY — MULTIPLE EXTRACTION WITH ALVEOLOPLASTY
Anesthesia: General

## 2017-06-24 ENCOUNTER — Ambulatory Visit: Payer: Self-pay | Admitting: Neurology

## 2017-06-25 ENCOUNTER — Encounter: Payer: Self-pay | Admitting: Neurology

## 2017-06-28 ENCOUNTER — Encounter: Payer: Self-pay | Admitting: Neurology

## 2017-06-29 ENCOUNTER — Other Ambulatory Visit: Payer: Self-pay

## 2017-06-29 ENCOUNTER — Other Ambulatory Visit: Payer: Self-pay | Admitting: Neurology

## 2017-06-29 DIAGNOSIS — G40019 Localization-related (focal) (partial) idiopathic epilepsy and epileptic syndromes with seizures of localized onset, intractable, without status epilepticus: Secondary | ICD-10-CM

## 2017-06-29 MED ORDER — LAMOTRIGINE 200 MG PO TABS: 200.0000 mg | ORAL_TABLET | Freq: Every day | ORAL | 1 refills | Status: DC

## 2017-07-03 ENCOUNTER — Telehealth: Payer: Self-pay | Admitting: Neurology

## 2017-07-03 NOTE — Telephone Encounter (Signed)
Patient dismissed from [facility] by Patrcia DollyKaren [name] MD, effective June 25, 2017. Dismissal letter sent out by certified / registered mail.  daj

## 2017-07-24 NOTE — Telephone Encounter (Signed)
Certified dismissal letter returned as undeliverable, unclaimed, return to sender after three attempts by [employer] on Jul 24, 2017. Letter placed in another envelope and resent as 1st class mail which does not require a signature. daj

## 2017-08-14 ENCOUNTER — Other Ambulatory Visit: Payer: Self-pay

## 2017-08-14 ENCOUNTER — Encounter (HOSPITAL_COMMUNITY): Payer: Self-pay | Admitting: Emergency Medicine

## 2017-08-14 ENCOUNTER — Emergency Department (HOSPITAL_COMMUNITY): Payer: Medicaid Other

## 2017-08-14 ENCOUNTER — Inpatient Hospital Stay (HOSPITAL_COMMUNITY)
Admission: EM | Admit: 2017-08-14 | Discharge: 2017-08-21 | DRG: 101 | Disposition: A | Discharge status: Home / Self Care | Payer: Medicaid Other | Attending: Internal Medicine | Admitting: Internal Medicine

## 2017-08-14 DIAGNOSIS — S43491A Other sprain of right shoulder joint, initial encounter: Secondary | ICD-10-CM | POA: Diagnosis present

## 2017-08-14 DIAGNOSIS — M25511 Pain in right shoulder: Secondary | ICD-10-CM | POA: Diagnosis present

## 2017-08-14 DIAGNOSIS — S43431A Superior glenoid labrum lesion of right shoulder, initial encounter: Secondary | ICD-10-CM | POA: Diagnosis not present

## 2017-08-14 DIAGNOSIS — G44019 Episodic cluster headache, not intractable: Secondary | ICD-10-CM | POA: Diagnosis present

## 2017-08-14 DIAGNOSIS — G40409 Other generalized epilepsy and epileptic syndromes, not intractable, without status epilepticus: Secondary | ICD-10-CM | POA: Diagnosis not present

## 2017-08-14 DIAGNOSIS — Z8249 Family history of ischemic heart disease and other diseases of the circulatory system: Secondary | ICD-10-CM

## 2017-08-14 DIAGNOSIS — R5600 Simple febrile convulsions: Secondary | ICD-10-CM

## 2017-08-14 DIAGNOSIS — E872 Acidosis, unspecified: Secondary | ICD-10-CM

## 2017-08-14 DIAGNOSIS — Z9114 Patient's other noncompliance with medication regimen: Secondary | ICD-10-CM | POA: Diagnosis not present

## 2017-08-14 DIAGNOSIS — G039 Meningitis, unspecified: Secondary | ICD-10-CM | POA: Diagnosis not present

## 2017-08-14 DIAGNOSIS — Z79899 Other long term (current) drug therapy: Secondary | ICD-10-CM

## 2017-08-14 DIAGNOSIS — W1830XA Fall on same level, unspecified, initial encounter: Secondary | ICD-10-CM | POA: Diagnosis present

## 2017-08-14 DIAGNOSIS — G8929 Other chronic pain: Secondary | ICD-10-CM | POA: Diagnosis present

## 2017-08-14 DIAGNOSIS — S43084A Other dislocation of right shoulder joint, initial encounter: Secondary | ICD-10-CM | POA: Diagnosis present

## 2017-08-14 DIAGNOSIS — M5440 Lumbago with sciatica, unspecified side: Secondary | ICD-10-CM | POA: Diagnosis not present

## 2017-08-14 DIAGNOSIS — R569 Unspecified convulsions: Secondary | ICD-10-CM

## 2017-08-14 DIAGNOSIS — M545 Low back pain: Secondary | ICD-10-CM | POA: Diagnosis present

## 2017-08-14 DIAGNOSIS — I10 Essential (primary) hypertension: Secondary | ICD-10-CM | POA: Diagnosis present

## 2017-08-14 DIAGNOSIS — N179 Acute kidney failure, unspecified: Secondary | ICD-10-CM | POA: Diagnosis present

## 2017-08-14 DIAGNOSIS — T39395A Adverse effect of other nonsteroidal anti-inflammatory drugs [NSAID], initial encounter: Secondary | ICD-10-CM | POA: Diagnosis present

## 2017-08-14 DIAGNOSIS — R0602 Shortness of breath: Secondary | ICD-10-CM

## 2017-08-14 LAB — URINALYSIS, ROUTINE W REFLEX MICROSCOPIC
Bilirubin Urine: NEGATIVE
Glucose, UA: NEGATIVE mg/dL
Ketones, ur: NEGATIVE mg/dL
Leukocytes, UA: NEGATIVE
Nitrite: NEGATIVE
Protein, ur: NEGATIVE mg/dL
Specific Gravity, Urine: 1.012 (ref 1.005–1.030)
pH: 5 (ref 5.0–8.0)

## 2017-08-14 LAB — CBC WITH DIFFERENTIAL/PLATELET
Basophils Absolute: 0 10*3/uL (ref 0.0–0.1)
Basophils Relative: 0 %
Eosinophils Absolute: 0 10*3/uL (ref 0.0–0.7)
Eosinophils Relative: 0 %
HCT: 50.3 % (ref 39.0–52.0)
Hemoglobin: 17.4 g/dL — ABNORMAL HIGH (ref 13.0–17.0)
Lymphocytes Relative: 7 %
Lymphs Abs: 1.1 10*3/uL (ref 0.7–4.0)
MCH: 28.2 pg (ref 26.0–34.0)
MCHC: 34.6 g/dL (ref 30.0–36.0)
MCV: 81.7 fL (ref 78.0–100.0)
Monocytes Absolute: 1.1 10*3/uL — ABNORMAL HIGH (ref 0.1–1.0)
Monocytes Relative: 7 %
Neutro Abs: 13.1 10*3/uL — ABNORMAL HIGH (ref 1.7–7.7)
Neutrophils Relative %: 86 %
Platelets: 234 10*3/uL (ref 150–400)
RBC: 6.16 MIL/uL — ABNORMAL HIGH (ref 4.22–5.81)
RDW: 13.1 % (ref 11.5–15.5)
WBC: 15.4 10*3/uL — ABNORMAL HIGH (ref 4.0–10.5)

## 2017-08-14 LAB — COMPREHENSIVE METABOLIC PANEL
ALT: 47 U/L (ref 17–63)
AST: 35 U/L (ref 15–41)
Albumin: 4.3 g/dL (ref 3.5–5.0)
Alkaline Phosphatase: 92 U/L (ref 38–126)
Anion gap: 8 (ref 5–15)
BUN: 12 mg/dL (ref 6–20)
CO2: 20 mmol/L — ABNORMAL LOW (ref 22–32)
Calcium: 9.2 mg/dL (ref 8.9–10.3)
Chloride: 111 mmol/L (ref 101–111)
Creatinine, Ser: 1.11 mg/dL (ref 0.61–1.24)
GFR calc Af Amer: >60 mL/min (ref >60)
GFR calc non Af Amer: >60 mL/min (ref >60)
Glucose, Bld: 115 mg/dL — ABNORMAL HIGH (ref 65–99)
Potassium: 4 mmol/L (ref 3.5–5.1)
Sodium: 139 mmol/L (ref 135–145)
Total Bilirubin: 0.7 mg/dL (ref 0.3–1.2)
Total Protein: 7.8 g/dL (ref 6.5–8.1)

## 2017-08-14 LAB — I-STAT CG4 LACTIC ACID, ED
Lactic Acid, Venous: 8.04 mmol/L (ref 0.5–1.9)
Lactic Acid, Venous: 1.82 mmol/L (ref 0.5–1.9)

## 2017-08-14 LAB — MAGNESIUM: MAGNESIUM: 2.1 mg/dL (ref 1.7–2.4)

## 2017-08-14 MED ORDER — LEVETIRACETAM IN NACL 1000 MG/100ML IV SOLN
1000.0000 mg | Freq: Once | INTRAVENOUS | Status: AC
  Administered 2017-08-14: 1000 mg via INTRAVENOUS
  Filled 2017-08-14: qty 100

## 2017-08-14 MED ORDER — LORAZEPAM 2 MG/ML IJ SOLN: 2.0000 mg | INTRAMUSCULAR | Status: DC | PRN

## 2017-08-14 MED ORDER — LORAZEPAM 2 MG/ML IJ SOLN
1.0000 mg | Freq: Once | INTRAMUSCULAR | Status: AC
  Administered 2017-08-14: 1 mg via INTRAVENOUS
  Filled 2017-08-14: qty 1

## 2017-08-14 MED ORDER — LIDOCAINE HCL (PF) 1 % IJ SOLN
2.0000 mL | Freq: Once | INTRAMUSCULAR | Status: DC
  Filled 2017-08-14: qty 30

## 2017-08-14 MED ORDER — LEVETIRACETAM IN NACL 500 MG/100ML IV SOLN
500.0000 mg | Freq: Two times a day (BID) | INTRAVENOUS | Status: DC
  Filled 2017-08-14: qty 100

## 2017-08-14 MED ORDER — ACETAMINOPHEN 325 MG PO TABS
650.0000 mg | ORAL_TABLET | Freq: Once | ORAL | Status: AC
  Administered 2017-08-14: 650 mg via ORAL
  Filled 2017-08-14: qty 2

## 2017-08-14 MED ORDER — SODIUM CHLORIDE 0.9 % IV SOLN
2.0000 g | Freq: Two times a day (BID) | INTRAVENOUS | Status: DC
  Administered 2017-08-14 – 2017-08-17 (×7): 2 g via INTRAVENOUS
  Filled 2017-08-14 (×8): qty 20

## 2017-08-14 MED ORDER — SODIUM CHLORIDE 0.9 % IV SOLN: 200.0000 mg | Freq: Two times a day (BID) | INTRAVENOUS | Status: DC

## 2017-08-14 MED ORDER — MORPHINE SULFATE (PF) 4 MG/ML IV SOLN
4.0000 mg | Freq: Once | INTRAVENOUS | Status: AC
  Administered 2017-08-14: 4 mg via INTRAVENOUS
  Filled 2017-08-14: qty 1

## 2017-08-14 MED ORDER — ACETAMINOPHEN 650 MG RE SUPP
650.0000 mg | Freq: Once | RECTAL | Status: AC
  Administered 2017-08-14: 650 mg via RECTAL
  Filled 2017-08-14: qty 1

## 2017-08-14 MED ORDER — SODIUM CHLORIDE 0.9 % IV SOLN
1000.0000 mL | INTRAVENOUS | Status: DC
  Administered 2017-08-14: 1000 mL via INTRAVENOUS

## 2017-08-14 MED ORDER — VANCOMYCIN HCL IN DEXTROSE 1-5 GM/200ML-% IV SOLN
1000.0000 mg | Freq: Once | INTRAVENOUS | Status: AC
  Administered 2017-08-14: 1000 mg via INTRAVENOUS
  Filled 2017-08-14: qty 200

## 2017-08-14 MED ORDER — VANCOMYCIN HCL 10 G IV SOLR
1500.0000 mg | Freq: Once | INTRAVENOUS | Status: AC
  Administered 2017-08-14: 1500 mg via INTRAVENOUS
  Filled 2017-08-14: qty 1500

## 2017-08-14 MED ORDER — DEXTROSE 5 % IV SOLN: 5.0000 mg/kg | Freq: Two times a day (BID) | INTRAVENOUS | Status: DC

## 2017-08-14 MED ORDER — ACETAMINOPHEN 325 MG PO TABS: 650.0000 mg | ORAL_TABLET | Freq: Four times a day (QID) | ORAL | Status: DC | PRN

## 2017-08-14 MED ORDER — SODIUM CHLORIDE 0.9 % IV SOLN
2.0000 g | Freq: Once | INTRAVENOUS | Status: AC
  Administered 2017-08-14: 2 g via INTRAVENOUS
  Filled 2017-08-14: qty 20

## 2017-08-14 MED ORDER — SODIUM CHLORIDE 0.9 % IV SOLN: 250.0000 mg | Freq: Two times a day (BID) | INTRAVENOUS | Status: DC

## 2017-08-14 MED ORDER — ZONISAMIDE 100 MG PO CAPS
200.0000 mg | ORAL_CAPSULE | Freq: Every day | ORAL | Status: DC
  Administered 2017-08-15 – 2017-08-20 (×7): 200 mg via ORAL
  Filled 2017-08-14 (×9): qty 2

## 2017-08-14 MED ORDER — DEXTROSE 5 % IV SOLN
10.0000 mg/kg | Freq: Three times a day (TID) | INTRAVENOUS | Status: DC
  Administered 2017-08-14 – 2017-08-19 (×14): 940 mg via INTRAVENOUS
  Filled 2017-08-14 (×17): qty 18.8

## 2017-08-14 MED ORDER — VANCOMYCIN HCL 10 G IV SOLR
1250.0000 mg | Freq: Three times a day (TID) | INTRAVENOUS | Status: DC
  Administered 2017-08-14 – 2017-08-17 (×9): 1250 mg via INTRAVENOUS
  Filled 2017-08-14 (×11): qty 1250

## 2017-08-14 MED ORDER — LAMOTRIGINE 100 MG PO TABS
200.0000 mg | ORAL_TABLET | Freq: Every day | ORAL | Status: DC
  Administered 2017-08-14 – 2017-08-21 (×8): 200 mg via ORAL
  Filled 2017-08-14 (×8): qty 2

## 2017-08-14 MED ORDER — ENOXAPARIN SODIUM 40 MG/0.4ML ~~LOC~~ SOLN
40.0000 mg | SUBCUTANEOUS | Status: DC
  Administered 2017-08-15: 40 mg via SUBCUTANEOUS
  Filled 2017-08-14: qty 0.4

## 2017-08-14 MED ORDER — ACETAMINOPHEN 325 MG PO TABS: 650.0000 mg | ORAL_TABLET | Freq: Once | ORAL | Status: DC

## 2017-08-14 MED ORDER — DEXTROSE 5 % IV SOLN
10.0000 mg/kg | Freq: Once | INTRAVENOUS | Status: AC
  Administered 2017-08-14: 940 mg via INTRAVENOUS
  Filled 2017-08-14: qty 18.8

## 2017-08-14 MED ORDER — MAGIC MOUTHWASH W/LIDOCAINE
5.0000 mL | Freq: Three times a day (TID) | ORAL | Status: DC | PRN
  Administered 2017-08-14 – 2017-08-19 (×5): 5 mL via ORAL
  Filled 2017-08-14 (×5): qty 5

## 2017-08-14 MED ORDER — HYDROCODONE-ACETAMINOPHEN 5-325 MG PO TABS
1.0000 | ORAL_TABLET | Freq: Four times a day (QID) | ORAL | Status: DC | PRN
  Administered 2017-08-14 – 2017-08-15 (×2): 2 via ORAL
  Administered 2017-08-15: 1 via ORAL
  Administered 2017-08-16: 2 via ORAL
  Filled 2017-08-14 (×2): qty 2
  Filled 2017-08-14: qty 1
  Filled 2017-08-14: qty 2

## 2017-08-14 MED ORDER — SODIUM CHLORIDE 0.9 % IV SOLN: 2.0000 g | INTRAVENOUS | Status: DC

## 2017-08-14 NOTE — Plan of Care (Signed)
Peter Barr was admitted this afternoon approximately 18:00.  He is drowsy, somewhat lethargic, but A/O and following commands.  His last seizure activity was 0800 this morning.  Nevertheless, seizure precautions are in effect, and we will continue to monitor.

## 2017-08-14 NOTE — H&P (Addendum)
History and Physical    Peter Barr NWG:[id number] DOB: [birth year] DOA: 08/14/2017  PCP: [name], L.[name], MD   Patient coming from: Home    Chief Complaint: Seizure episodes, fever  HPI: Peter Barr is a 32 y.o. male with medical history significant of seizure disorder who was brought to the emergency department from home by his wife after he had 2 episodes of generalized tonic-clonic seizures at home early this morning.  Apparently around at 3:30 AM patient woke up and went to the bathroom.  His wife heard a loud sound and found him seizing in the bathroom.  After that he had second tonic-clonic seizure episode that lasted about 30 seconds. As per the report, he missed 2 days of his medication because he forgot to refill.  Reports that he is usually compliant with his medications. He is on Lamictal at home. He also says that he needs to follow-up with a new neurologist as an outpatient.  He denies any other significant past medical history.  He bit his tongue during the seizure episodes and was complaining of pain.  Patient also reported that he was not feeling well yesterday evening.  He thought he was having some fever and headache.  Also said that he was nauseated but did not vomit. Patient reported to have an another seizure episode while in the CT scan.  He was given Ativan after which seizure broke down.  Patient noted to have fever on presentation.  His lactic acid was found to be severely elevated.  Neurology was consulted.  There was concern of meningitis versus encephalitis because he presented with fever and headache .  Patient denies any sick contacts. Patient seen and examined the bedside in the emergency department.  He was still drowsy after being given Ativan but he was alert and oriented.  Patient complains of headache and tongue pain but denies any neck pain and he does not have any neck rigidity.  Found to have mild leukocytosis on presentation.  ED Course: Neurology  consulted.  Underwent lumbar puncture in the emergency department but only 1 mL of CSF was obtained which was sent for Gram stain and culture.  Patient is started on empiric antibiotics to cover for bacterial and viral meningitis.  Review of Systems: As per HPI otherwise 10 point review of systems negative.    Past Medical History:  Diagnosis Date  . Anxiety   . Complication of anesthesia    " one time I had a convulsion "  . Hypertension   . Migraine   . Seizures (HCC)     Past Surgical History:  Procedure Laterality Date  . KNEE SURGERY Right      reports that he has never smoked. He has never used smokeless tobacco. He reports that he drinks alcohol. He reports that he does not use drugs.  Allergies  Allergen Reactions  . Depakote Er [Divalproex Sodium Er] Other (See Comments)    Caused burns on arm  . Dilantin [Phenytoin Sodium Extended] Other (See Comments)    Caused burns on arm   . Adhesive [Tape] Itching and Rash    Heart monitor sticky pads (leads)  . Tapentadol Itching and Rash    Heart monitor sticky pads (leads)  . Tramadol Palpitations    Spasms, dizzy (also)    Family History  Problem Relation Age of Onset  . Hypertension Father   . Colon cancer Maternal Grandfather      Prior to Admission medications   Medication Sig Start  Date End Date Taking? Authorizing Provider  acetaminophen (TYLENOL) 500 MG tablet Take 1,000 mg by mouth every 8 (eight) hours as needed for mild pain.    Yes [provider]  cyclobenzaprine (FLEXERIL) 5 MG tablet Take 1 tablet (5 mg total) by mouth every 8 (eight) hours as needed for muscle spasms. 03/11/16  Yes Van ClinesAquino, [name], MD  lamoTRIgine (LAMICTAL) 200 MG tablet Take 1 tablet (200 mg total) by mouth daily. Take 1 tablet each morning and 1 1/2 tablets each night 06/29/17  Yes Van ClinesAquino, [name], MD  zonisamide [facility](ZONEGRAN) 100 MG capsule SEE NOTES Patient taking differently: 200 MG at night 10/21/16  Yes Van ClinesAquino, [name], MD      Physical Exam: Vitals:   08/14/17 0930 08/14/17 0951 08/14/17 1030 08/14/17 1130  BP: (!) 165/93  (!) 142/80 117/70  Pulse: (!) 115  (!) 104 (!) 106  Resp: (!) 29  (!) 37 (!) 30  Temp:  100 F (37.8 C)    TempSrc:  Oral    SpO2: 93%  94% 94%  Weight:      Height:        Constitutional: Drowsy, calm, comfortable,obese Vitals:   08/14/17 0930 08/14/17 0951 08/14/17 1030 08/14/17 1130  BP: (!) 165/93  (!) 142/80 117/70  Pulse: (!) 115  (!) 104 (!) 106  Resp: (!) 29  (!) 37 (!) 30  Temp:  100 F (37.8 C)    TempSrc:  Oral    SpO2: 93%  94% 94%  Weight:      Height:       Eyes: PERRL, lids and conjunctivae normal ENMT: Mucous membranes are moist. Posterior pharynx clear of any exudate or lesions.Normal dentition.  Tongue bite lesion Neck: normal, supple, no masses, no thyromegaly Respiratory: clear to auscultation bilaterally, no wheezing, no crackles. Normal respiratory effort. No accessory muscle use.  Cardiovascular: Regular rate and rhythm, no murmurs / rubs / gallops. No extremity edema. 2+ pedal pulses. No carotid bruits.  Abdomen: no tenderness, no masses palpated. No hepatosplenomegaly. Bowel sounds positive.  Musculoskeletal: no clubbing / cyanosis. No joint deformity upper and lower extremities. Good ROM, no contractures. Normal muscle tone.  Skin: no rashes, lesions, ulcers. No induration Neurologic: CN 2-12 grossly intact. Sensation intact, DTR normal. Strength 5/5 in all 4.  Psychiatric: Normal judgment and insight. Alert and oriented x 3. Normal mood.   Foley Catheter:None  Labs on Admission: I have personally reviewed following labs and imaging studies  CBC: Recent Labs  Lab 08/14/17 0723  WBC 15.4*  NEUTROABS 13.1*  HGB 17.4*  HCT 50.3  MCV 81.7  PLT 234   Basic Metabolic Panel: Recent Labs  Lab 08/14/17 0723  NA 139  K 4.0  CL 111  CO2 20*  GLUCOSE 115*  BUN 12  CREATININE 1.11  CALCIUM 9.2  MG 2.1   GFR: Estimated Creatinine  Clearance: 129.7 mL/min (by C-G formula based on SCr of 1.11 mg/dL). Liver Function Tests: Recent Labs  Lab 08/14/17 0723  AST 35  ALT 47  ALKPHOS 92  BILITOT 0.7  PROT 7.8  ALBUMIN 4.3   No results for input(s): LIPASE, AMYLASE in the last 168 hours. No results for input(s): AMMONIA in the last 168 hours. Coagulation Profile: No results for input(s): INR, PROTIME in the last 168 hours. Cardiac Enzymes: No results for input(s): CKTOTAL, CKMB, CKMBINDEX, TROPONINI in the last 168 hours. BNP (last 3 results) No results for input(s): PROBNP in the last 8760 hours.  HbA1C: No results for input(s): HGBA1C in the last 72 hours. CBG: No results for input(s): GLUCAP in the last 168 hours. Lipid Profile: No results for input(s): CHOL, HDL, LDLCALC, TRIG, CHOLHDL, LDLDIRECT in the last 72 hours. Thyroid Function Tests: No results for input(s): TSH, T4TOTAL, FREET4, T3FREE, THYROIDAB in the last 72 hours. Anemia Panel: No results for input(s): VITAMINB12, FOLATE, FERRITIN, TIBC, IRON, RETICCTPCT in the last 72 hours. Urine analysis:    Component Value Date/Time   COLORURINE STRAW (A) 08/14/2017 0656   APPEARANCEUR CLEAR 08/14/2017 0656   LABSPEC 1.012 08/14/2017 0656   PHURINE 5.0 08/14/2017 0656   GLUCOSEU NEGATIVE 08/14/2017 0656   HGBUR MODERATE (A) 08/14/2017 0656   BILIRUBINUR NEGATIVE 08/14/2017 0656   KETONESUR NEGATIVE 08/14/2017 0656   PROTEINUR NEGATIVE 08/14/2017 0656   UROBILINOGEN 1.0 07/25/2014 1148   NITRITE NEGATIVE 08/14/2017 0656   LEUKOCYTESUR NEGATIVE 08/14/2017 0656    Radiological Exams on Admission: Dg Chest 2 View  Result Date: 08/14/2017 CLINICAL DATA:  Seizure EXAM: CHEST - 2 VIEW COMPARISON:  10/23/2015 FINDINGS: Chronic elevation of the right hemidiaphragm. Poor inspiration today. Chronic volume loss at the right lung base, but with accentuation due to the poor inspiration. Can't rule out active basilar atelectasis or pneumonia. Upper lungs are clear.  IMPRESSION: Poor inspiration. Chronic elevation of the right hemidiaphragm. Chronic volume loss at the right base. Volume loss at both lung bases is accentuated due to the poor inspiration. Can't rule out basilar atelectasis or pneumonia. Electronically Signed   By: Paulina Fusi M.D.   On: 08/14/2017 09:29   Dg Shoulder Right  Result Date: 08/14/2017 CLINICAL DATA:  32 year old male status post seizures, severe right shoulder and arm pain. EXAM: RIGHT SHOULDER - 2+ VIEW COMPARISON:  12/24/2015 FINDINGS: Bone mineralization is within normal limits. There is no evidence of fracture or dislocation. There is no evidence of arthropathy or other focal bone abnormality. Soft tissues are unremarkable. IMPRESSION: Negative. Electronically Signed   By: Odessa Fleming M.D.   On: 08/14/2017 07:46   Ct Head Wo Contrast  Result Date: 08/14/2017 CLINICAL DATA:  Patient was in bed and had seizures. Patient has known seizure disorder. Evaluate for intracranial abnormality or cervical spine ligamentous injury. EXAM: CT HEAD WITHOUT CONTRAST CT CERVICAL SPINE WITHOUT CONTRAST TECHNIQUE: Multidetector CT imaging of the head and cervical spine was performed following the standard protocol without intravenous contrast. Multiplanar CT image reconstructions of the cervical spine were also generated. COMPARISON:  CT head 12/24/2015. FINDINGS: The patient was unable to remain motionless for the exam. Small or subtle lesions could be overlooked. CT HEAD FINDINGS Brain: No evidence for acute infarction, hemorrhage, mass lesion, hydrocephalus, or extra-axial fluid. Normal for age cerebral volume. No definite white matter disease. Vascular: No hyperdense vessel or unexpected calcification. Skull: Normal. Negative for fracture or focal lesion. Sinuses/Orbits: No acute finding. Other: Compared with prior head CT, no change. CT CERVICAL SPINE FINDINGS Significant motion degradation. Alignment: Straightening of the normal cervical lordosis. Skull  base and vertebrae: No definite fracture on this motion degraded scan. Soft tissues and spinal canal: No definite prevertebral fluid. Cannot assess for canal hematoma. Disc levels:  Intervertebral disc spaces are preserved. Upper chest: Respiratory motion. No visible pneumothorax or large mass. Other: None. IMPRESSION: Unremarkable CT head without contrast. No skull fracture, intracranial hemorrhage, or visible mass lesion on this noncontrast exam. Motion degraded cervical spine CT demonstrating no gross fracture or subluxation. Electronically Signed   By: Dale [place].D.  On: 08/14/2017 08:21   Ct Cervical Spine Wo Contrast  Result Date: 08/14/2017 CLINICAL DATA:  Patient was in bed and had seizures. Patient has known seizure disorder. Evaluate for intracranial abnormality or cervical spine ligamentous injury. EXAM: CT HEAD WITHOUT CONTRAST CT CERVICAL SPINE WITHOUT CONTRAST TECHNIQUE: Multidetector CT imaging of the head and cervical spine was performed following the standard protocol without intravenous contrast. Multiplanar CT image reconstructions of the cervical spine were also generated. COMPARISON:  CT head 12/24/2015. FINDINGS: The patient was unable to remain motionless for the exam. Small or subtle lesions could be overlooked. CT HEAD FINDINGS Brain: No evidence for acute infarction, hemorrhage, mass lesion, hydrocephalus, or extra-axial fluid. Normal for age cerebral volume. No definite white matter disease. Vascular: No hyperdense vessel or unexpected calcification. Skull: Normal. Negative for fracture or focal lesion. Sinuses/Orbits: No acute finding. Other: Compared with prior head CT, no change. CT CERVICAL SPINE FINDINGS Significant motion degradation. Alignment: Straightening of the normal cervical lordosis. Skull base and vertebrae: No definite fracture on this motion degraded scan. Soft tissues and spinal canal: No definite prevertebral fluid. Cannot assess for canal hematoma. Disc levels:   Intervertebral disc spaces are preserved. Upper chest: Respiratory motion. No visible pneumothorax or large mass. Other: None. IMPRESSION: Unremarkable CT head without contrast. No skull fracture, intracranial hemorrhage, or visible mass lesion on this noncontrast exam. Motion degraded cervical spine CT demonstrating no gross fracture or subluxation. Electronically Signed   By: Elsie Stain M.D.   On: 08/14/2017 08:21   Dg Humerus Right  Result Date: 08/14/2017 CLINICAL DATA:  Acute right arm pain after multiple seizures last night. EXAM: RIGHT HUMERUS - 2+ VIEW COMPARISON:  None. FINDINGS: There is no evidence of fracture or other focal bone lesions. Soft tissues are unremarkable. IMPRESSION: Normal right humerus. Electronically Signed   By: Lupita Raider, M.D.   On: 08/14/2017 07:50     Assessment/Plan Principal Problem:   Seizure [facility]) Active Problems:   Lactic acidosis   Seizure episodes/seizure disorder: On Lamictal at home which has been restarted.  Also started on IV Keppra.  Neurology following.  Patient will be admitted to neuro stepdown unit in [facility].  Continue Ativan for breakthrough seizures. Seizure precautions.  N.p.o. for now as patient is still drowsy. Patient was also complaining of right shoulder pain and dislocation was initially suspected but the imagings are normal.  Suspected meningitis/encephalitis: Presented with fever.  This could be also from the seizure itself.  But needs to cover with empiric antibiotics.  Attempted lumbar puncture in the emergency department with success of  Just 1 mL of CSF. Sent for Gram stain and culture.  I think it is prudent to attempt another lumbar puncture for complete studies including HSV PCR. We will request IR for lumbar puncture after the patient moves to [facility].  Patient does not have any neck rigidity during my examination. Follow blood cultures.  Lactic acidosis: Most likely secondary to seizure episode.   Resolved with IV fluids.  Leukocytosis: Most likely reactive from seizures.  Patient was also febrile on presentation so cannot rule out meningitis until we get the CSF report back.  We will continue to monitor the trend.  Patient mildly tachycardic during my evaluation.  Severity of Illness: The appropriate patient status for this patient is INPATIENT    DVT prophylaxis: Lovenox Code Status: Full Family Communication: None present at the bedside Consults called: Neurology called by ED     Burnadette Pop MD  [facility] Pager [phone]  If 7PM-7AM, please contact night-coverage [email or web address] Password [facility]  08/14/2017, 11:37 AM

## 2017-08-14 NOTE — ED Notes (Signed)
ED TO INPATIENT HANDOFF REPORT  Name/Age/Gender Peter Barr 32 y.o. male  Code Status    Code Status Orders  (From admission, onward)        Start     Ordered   08/14/17 1118  Full code  Continuous     08/14/17 1118    Code Status History    Date Active Date Inactive Code Status Order ID Comments User Context   12/24/2015 1210 12/26/2015 2146 Full Code [id number]  Reubin Milan, MD Inpatient   04/16/2013 1723 04/22/2013 1536 Full Code [id number]  Mendel Corning, MD Inpatient      Home/SNF/Other Home  Chief Complaint SEIZURES  Level of Care/Admitting Diagnosis ED Disposition    ED Disposition Condition [place]: [place] [[id number]]  Level of Care: Stepdown [14]  Diagnosis: Seizure [facility]) [[id number]]  Admitting Physician: Shelly Coss [[id number]]  Attending Physician: Shelly Coss [[id number]]  Estimated length of stay: past midnight tomorrow  Certification:: I certify this patient will need inpatient services for at least 2 midnights  PT Class (Do Not Modify): Inpatient [101]  PT Acc Code (Do Not Modify): Private [1]       Medical History Past Medical History:  Diagnosis Date  . Anxiety   . Complication of anesthesia    " one time I had a convulsion "  . Hypertension   . Migraine   . Seizures (HCC)     Allergies Allergies  Allergen Reactions  . Depakote Er [Divalproex Sodium Er] Other (See Comments)    Caused burns on arm  . Dilantin [Phenytoin Sodium Extended] Other (See Comments)    Caused burns on arm   . Adhesive [Tape] Itching and Rash    Heart monitor sticky pads (leads)  . Tapentadol Itching and Rash    Heart monitor sticky pads (leads)  . Tramadol Palpitations    Spasms, dizzy (also)    IV Location/Drains/Wounds Patient Lines/Drains/Airways Status   Active Line/Drains/Airways    Name:   Placement date:   Placement time:   Site:   Days:   Peripheral IV 08/14/17 Left Hand   08/14/17    -     Hand   less than 1   Peripheral IV 08/14/17 Right Antecubital   08/14/17    1036    Antecubital   less than 1   Wound 04/16/13 Abrasion(s) Other (Comment) Right;Left   04/16/13    1105    Other (Comment)   1581          Labs/Imaging Results for orders placed or performed during the hospital encounter of 08/14/17 (from the past 48 hour(s))  Urinalysis, Routine w reflex microscopic     Status: Abnormal   Collection Time: 08/14/17  6:56 AM  Result Value Ref Range   Color, Urine STRAW (A) YELLOW   APPearance CLEAR CLEAR   Specific Gravity, Urine 1.012 1.005 - 1.030   pH 5.0 5.0 - 8.0   Glucose, UA NEGATIVE NEGATIVE mg/dL   Hgb urine dipstick MODERATE (A) NEGATIVE   Bilirubin Urine NEGATIVE NEGATIVE   Ketones, ur NEGATIVE NEGATIVE mg/dL   Protein, ur NEGATIVE NEGATIVE mg/dL   Nitrite NEGATIVE NEGATIVE   Leukocytes, UA NEGATIVE NEGATIVE   WBC, UA 0-5 0 - 5 WBC/hpf   Bacteria, UA RARE (A) NONE SEEN    Comment: Performed at [facility], [place] [address] [place],  [address]  CBC with Differential     Status: Abnormal  Collection Time: 08/14/17  7:23 AM  Result Value Ref Range   WBC 15.4 (H) 4.0 - 10.5 K/uL   RBC 6.16 (H) 4.22 - 5.81 MIL/uL   Hemoglobin 17.4 (H) 13.0 - 17.0 g/dL   HCT 50.3 39.0 - 52.0 %   MCV 81.7 78.0 - 100.0 fL   MCH 28.2 26.0 - 34.0 pg   MCHC 34.6 30.0 - 36.0 g/dL   RDW 13.1 11.5 - 15.5 %   Platelets 234 150 - 400 K/uL   Neutrophils Relative % 86 %   Neutro Abs 13.1 (H) 1.7 - 7.7 K/uL   Lymphocytes Relative 7 %   Lymphs Abs 1.1 0.7 - 4.0 K/uL   Monocytes Relative 7 %   Monocytes Absolute 1.1 (H) 0.1 - 1.0 K/uL   Eosinophils Relative 0 %   Eosinophils Absolute 0.0 0.0 - 0.7 K/uL   Basophils Relative 0 %   Basophils Absolute 0.0 0.0 - 0.1 K/uL    Comment: Performed at [facility], [place] [address], [place], [place] [address]  Comprehensive metabolic panel     Status: Abnormal   Collection Time: 08/14/17  7:23 AM   Result Value Ref Range   Sodium 139 135 - 145 mmol/L   Potassium 4.0 3.5 - 5.1 mmol/L   Chloride 111 101 - 111 mmol/L   CO2 20 (L) 22 - 32 mmol/L   Glucose, Bld 115 (H) 65 - 99 mg/dL   BUN 12 6 - 20 mg/dL   Creatinine, Ser 1.11 0.61 - 1.24 mg/dL   Calcium 9.2 8.9 - 10.3 mg/dL   Total Protein 7.8 6.5 - 8.1 g/dL   Albumin 4.3 3.5 - 5.0 g/dL   AST 35 15 - 41 U/L   ALT 47 17 - 63 U/L   Alkaline Phosphatase 92 38 - 126 U/L   Total Bilirubin 0.7 0.3 - 1.2 mg/dL   GFR calc non Af Amer >60 >60 mL/min   GFR calc Af Amer >60 >60 mL/min    Comment: (NOTE) The eGFR has been calculated using the CKD EPI equation. This calculation has not been validated in all clinical situations. eGFR's persistently <60 mL/min signify possible Chronic Kidney Disease.    Anion gap 8 5 - 15    Comment: Performed at [facility], [place] [address], [place], [place] [address]  Magnesium     Status: None   Collection Time: 08/14/17  7:23 AM  Result Value Ref Range   Magnesium 2.1 1.7 - 2.4 mg/dL    Comment: Performed at [facility], [place] [address] [place], [place] [address]  I-Stat CG4 Lactic Acid, ED     Status: Abnormal   Collection Time: 08/14/17  8:51 AM  Result Value Ref Range   Lactic Acid, Venous 8.04 (HH) 0.5 - 1.9 mmol/L   Comment NOTIFIED PHYSICIAN   CSF culture     Status: None (Preliminary result)   Collection Time: 08/14/17  9:54 AM  Result Value Ref Range   Specimen Description CSF    Special Requests NONE    Gram Stain      WBC PRESENT,BOTH PMN AND MONONUCLEAR NO ORGANISMS SEEN CYTOSPIN SMEAR Gram Stain Report Called to,Read Back By and Verified With: ,L @ 2633 ON [id number] BY POTEAT,S Performed at [facility], [place] [address] [place], [place] [address]    Culture PENDING    Report Status PENDING   I-Stat CG4 Lactic Acid, ED     Status: None  Collection Time: 08/14/17 10:47 AM  Result Value Ref Range   Lactic Acid, Venous  1.82 0.5 - 1.9 mmol/L   Dg Chest 2 View  Result Date: 08/14/2017 CLINICAL DATA:  Seizure EXAM: CHEST - 2 VIEW COMPARISON:  10/23/2015 FINDINGS: Chronic elevation of the right hemidiaphragm. Poor inspiration today. Chronic volume loss at the right lung base, but with accentuation due to the poor inspiration. Can't rule out active basilar atelectasis or pneumonia. Upper lungs are clear. IMPRESSION: Poor inspiration. Chronic elevation of the right hemidiaphragm. Chronic volume loss at the right base. Volume loss at both lung bases is accentuated due to the poor inspiration. Can't rule out basilar atelectasis or pneumonia. Electronically Signed   By: Nelson Chimes M.D.   On: 08/14/2017 09:29   Dg Shoulder Right  Result Date: 08/14/2017 CLINICAL DATA:  32 year old male status post seizures, severe right shoulder and arm pain. EXAM: RIGHT SHOULDER - 2+ VIEW COMPARISON:  12/24/2015 FINDINGS: Bone mineralization is within normal limits. There is no evidence of fracture or dislocation. There is no evidence of arthropathy or other focal bone abnormality. Soft tissues are unremarkable. IMPRESSION: Negative. Electronically Signed   By: Genevie Ann M.D.   On: 08/14/2017 07:46   Ct Head Wo Contrast  Result Date: 08/14/2017 CLINICAL DATA:  Patient was in bed and had seizures. Patient has known seizure disorder. Evaluate for intracranial abnormality or cervical spine ligamentous injury. EXAM: CT HEAD WITHOUT CONTRAST CT CERVICAL SPINE WITHOUT CONTRAST TECHNIQUE: Multidetector CT imaging of the head and cervical spine was performed following the standard protocol without intravenous contrast. Multiplanar CT image reconstructions of the cervical spine were also generated. COMPARISON:  CT head 12/24/2015. FINDINGS: The patient was unable to remain motionless for the exam. Small or subtle lesions could be overlooked. CT HEAD FINDINGS Brain: No evidence for acute infarction, hemorrhage, mass lesion, hydrocephalus, or extra-axial  fluid. Normal for age cerebral volume. No definite white matter disease. Vascular: No hyperdense vessel or unexpected calcification. Skull: Normal. Negative for fracture or focal lesion. Sinuses/Orbits: No acute finding. Other: Compared with prior head CT, no change. CT CERVICAL SPINE FINDINGS Significant motion degradation. Alignment: Straightening of the normal cervical lordosis. Skull base and vertebrae: No definite fracture on this motion degraded scan. Soft tissues and spinal canal: No definite prevertebral fluid. Cannot assess for canal hematoma. Disc levels:  Intervertebral disc spaces are preserved. Upper chest: Respiratory motion. No visible pneumothorax or large mass. Other: None. IMPRESSION: Unremarkable CT head without contrast. No skull fracture, intracranial hemorrhage, or visible mass lesion on this noncontrast exam. Motion degraded cervical spine CT demonstrating no gross fracture or subluxation. Electronically Signed   By: Staci Righter M.D.   On: 08/14/2017 08:21   Ct Cervical Spine Wo Contrast  Result Date: 08/14/2017 CLINICAL DATA:  Patient was in bed and had seizures. Patient has known seizure disorder. Evaluate for intracranial abnormality or cervical spine ligamentous injury. EXAM: CT HEAD WITHOUT CONTRAST CT CERVICAL SPINE WITHOUT CONTRAST TECHNIQUE: Multidetector CT imaging of the head and cervical spine was performed following the standard protocol without intravenous contrast. Multiplanar CT image reconstructions of the cervical spine were also generated. COMPARISON:  CT head 12/24/2015. FINDINGS: The patient was unable to remain motionless for the exam. Small or subtle lesions could be overlooked. CT HEAD FINDINGS Brain: No evidence for acute infarction, hemorrhage, mass lesion, hydrocephalus, or extra-axial fluid. Normal for age cerebral volume. No definite white matter disease. Vascular: No hyperdense vessel or unexpected calcification. Skull: Normal. Negative for fracture  or focal  lesion. Sinuses/Orbits: No acute finding. Other: Compared with prior head CT, no change. CT CERVICAL SPINE FINDINGS Significant motion degradation. Alignment: Straightening of the normal cervical lordosis. Skull base and vertebrae: No definite fracture on this motion degraded scan. Soft tissues and spinal canal: No definite prevertebral fluid. Cannot assess for canal hematoma. Disc levels:  Intervertebral disc spaces are preserved. Upper chest: Respiratory motion. No visible pneumothorax or large mass. Other: None. IMPRESSION: Unremarkable CT head without contrast. No skull fracture, intracranial hemorrhage, or visible mass lesion on this noncontrast exam. Motion degraded cervical spine CT demonstrating no gross fracture or subluxation. Electronically Signed   By: Staci Righter M.D.   On: 08/14/2017 08:21   Dg Humerus Right  Result Date: 08/14/2017 CLINICAL DATA:  Acute right arm pain after multiple seizures last night. EXAM: RIGHT HUMERUS - 2+ VIEW COMPARISON:  None. FINDINGS: There is no evidence of fracture or other focal bone lesions. Soft tissues are unremarkable. IMPRESSION: Normal right humerus. Electronically Signed   By: Marijo Conception, M.D.   On: 08/14/2017 07:50    Pending Labs Unresulted Labs (From admission, onward)   Start     Ordered   08/15/17 3143  Basic metabolic panel  Tomorrow morning,   R     08/14/17 1118   08/15/17 0500  CBC  Tomorrow morning,   R     08/14/17 1118   08/14/17 1230  Herpes simplex virus (HSV), DNA by PCR  Once,   R     08/14/17 1230   08/14/17 1149  HSV(herpes smplx vrs)abs-I+II(IgG)-CSF  Once,   R     08/14/17 1149   08/14/17 1116  HIV antibody (Routine Testing)  Once,   R     08/14/17 1118   08/14/17 0950  Gram stain  [facility] ED ADULT CSF PANEL)  STAT,   STAT     08/14/17 0950   08/14/17 0849  Lamotrigine level  Once,   R     08/14/17 0848   08/14/17 0828  Blood culture (routine x 2)  BLOOD CULTURE X 2,   STAT     08/14/17 0827       Vitals/Pain Today's Vitals   08/14/17 1430 08/14/17 1500 08/14/17 1530 08/14/17 1539  BP: 116/82 115/76 122/75   Pulse: 100 100 99   Resp: (!) 30 (!) 29 (!) 28   Temp:      TempSrc:      SpO2: 93% 93% 92%   Weight:      Height:      PainSc:    8     Isolation Precautions No active isolations  Medications Medications  lamoTRIgine (LAMICTAL) tablet 200 mg (200 mg Oral Given 08/14/17 1417)  levETIRAcetam (KEPPRA) IVPB 500 mg/100 mL premix (has no administration in time range)  0.9 %  sodium chloride infusion (1,000 mLs Intravenous New Bag/Given 08/14/17 1052)  enoxaparin (LOVENOX) injection 40 mg (has no administration in time range)  cefTRIAXone (ROCEPHIN) 2 g in sodium chloride 0.9 % 100 mL IVPB (has no administration in time range)  acyclovir (ZOVIRAX) 940 mg in dextrose 5 % 150 mL IVPB (has no administration in time range)  LORazepam (ATIVAN) injection 2 mg (has no administration in time range)  acetaminophen (TYLENOL) tablet 650 mg (has no administration in time range)  vancomycin (VANCOCIN) 1,250 mg in sodium chloride 0.9 % 250 mL IVPB (has no administration in time range)  LORazepam (ATIVAN) injection 1 mg (1 mg Intravenous Given 08/14/17  0812)  acetaminophen (TYLENOL) suppository 650 mg (650 mg Rectal Given 08/14/17 0825)  levETIRAcetam (KEPPRA) IVPB 1000 mg/100 mL premix (0 mg Intravenous Stopped 08/14/17 0854)  vancomycin (VANCOCIN) IVPB 1000 mg/200 mL premix (0 mg Intravenous Stopped 08/14/17 1158)  cefTRIAXone (ROCEPHIN) 2 g in sodium chloride 0.9 % 100 mL IVPB (0 g Intravenous Stopped 08/14/17 1127)  acyclovir (ZOVIRAX) 940 mg in dextrose 5 % 150 mL IVPB (0 mg/kg  94.2 kg (Adjusted) Intravenous Stopped 08/14/17 1523)  vancomycin (VANCOCIN) 1,500 mg in sodium chloride 0.9 % 500 mL IVPB (0 mg Intravenous Stopped 08/14/17 1439)  morphine 4 MG/ML injection 4 mg (4 mg Intravenous Given 08/14/17 1414)  acetaminophen (TYLENOL) tablet 650 mg (650 mg Oral Given 08/14/17 1539)     Mobility walks with person assist

## 2017-08-14 NOTE — Progress Notes (Signed)
Pharmacy Antibiotic Note  Langston Maskerhillip Imhof is a 32 y.o. male admitted on 08/14/2017 with active seizures & empiric coverage of  meningitis due to report of neck pain.  Pharmacy has been consulted for Vancomycin dosing. Rocephin & Acyclovir per MD.  Initial doses given in ED.  08/14/2017:   Tm 100.8  WBC  Elevated at 15.4  Renal function at baseline, NCrCl ~12100ml/min  Cx data pending  Plan: Vancomycin 1250 IV every 8 hours.  Goal trough 15-20 mcg/mL.  Rocephin & Acyclovir per MD Monitor renal function and cx data    Height: 5\' 11"  (180.3 cm) Weight: 270 lb (122.5 kg) IBW/kg (Calculated) : 75.3  Temp (24hrs), Avg:100 F (37.8 C), Min:98.3 F (36.8 C), Max:100.8 F (38.2 C)  Recent Labs  Lab 08/14/17 0723 08/14/17 0851 08/14/17 1047  WBC 15.4*  --   --   CREATININE 1.11  --   --   LATICACIDVEN  --  8.04* 1.82    Estimated Creatinine Clearance: 129.7 mL/min (by C-G formula based on SCr of 1.11 mg/dL).    Allergies  Allergen Reactions  . Depakote Er [Divalproex Sodium Er] Other (See Comments)    Caused burns on arm  . Dilantin [Phenytoin Sodium Extended] Other (See Comments)    Caused burns on arm   . Adhesive [Tape] Itching and Rash    Heart monitor sticky pads (leads)  . Tapentadol Itching and Rash    Heart monitor sticky pads (leads)  . Tramadol Palpitations    Spasms, dizzy (also)    Antimicrobials this admission: 6/7 Rocephin >>  6/7 Vanc >>  6/7 Acyclovir>>  Dose adjustments this admission:  Microbiology results: 6/7 BCx:  6/7 CSF cx:   Thank you for allowing pharmacy to be a part of this patient's care.  Elson ClanLilliston, Rodolphe Edmonston Michelle 08/14/2017 12:12 PM

## 2017-08-14 NOTE — ED Notes (Signed)
Patient had seizure in CT scan. MD notified. Patient A/Ox4. Vital signs obtained.

## 2017-08-14 NOTE — ED Provider Notes (Signed)
I saw and evaluated the patient, reviewed the resident's note and I agree with the findings and plan.  EKG: None 32 year old male presents after coming to seizure just prior to arrival.  Witnessed by a relative.  Here had a witnessed tonic-clonic seizure with resultant tongue injury.  Neurology consultation obtained he was started on antiepileptics as well as given Ativan.  Patient's imaging is pending at this time   Lorre NickAllen, Teighan Aubert, MD 08/14/17 [phone]

## 2017-08-14 NOTE — ED Notes (Signed)
Both sets of cultures drawn before antibiotics given.

## 2017-08-14 NOTE — ED Provider Notes (Signed)
[facility]-EMERGENCY DEPT Provider Note   CSN: [id number] Arrival date & time: 08/14/17  0511     History   Chief Complaint Chief Complaint  Patient presents with  . Seizures    HPI Peter Barr is a 32 y.o. male with history of anxiety, seizure disorder, hypertension, migraines presents for evaluation of acute onset, resolved seizures.  He was unable to give me much of a history but I spoke with his wife on the phone who stated that at around 3:30 AM this morning the patient may have gotten up to go to the bathroom.  The patient's wife states that she awoke when she heard the patient make a loud sound and she turned to find him "convulsing".  She described about 30 seconds of tonic-clonic activity.  She states the patient did bite his tongue but did not urinate on himself.  She states that shortly thereafter he was aggressive and confused.  She states that he attempted to go back to sleep at around 4 AM but had another episode of generalized tonic-clonic activity for about 30 seconds.  She did state with the first episode she heard a "snap" and thinks that he may have injured his right arm.  The patient tells me he does not remember what happened.  He is endorsing constant right shoulder pain and states he has not been able to move the extremity.  Pain radiates down the right upper extremity and he endorses numbness and tingling of the right upper extremity.  He also endorses dizziness and a frontal throbbing headache which radiates to the occipital region.  He endorses aching pain of the back "all over "which radiates to the right side.  Patient was recently discharged from his neurology practice due to their no-show policy but he was given refills of his medication through next month to give him time to find a neurologist.  He states that he has been compliant with his medications save for missing one dose last week when taking a sleeping pill.  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  Patient denies recent travel.  The history is provided by the patient.    Past Medical History:  Diagnosis Date  . Anxiety   . Complication of anesthesia    " one time I had a convulsion "  . Hypertension   . Migraine   . Seizures [facility])     Patient Active Problem List   Diagnosis Date Noted  . Lactic acidosis 08/14/2017  . Hyperkalemia 12/24/2015  . Hypokalemia 12/24/2015  . Hypoalbuminemia 12/24/2015  . Hypocalcemia 12/24/2015  . Localization-related idiopathic epilepsy and epileptic syndromes with seizures of localized onset, intractable, without status epilepticus (HCC) 05/09/2015  . Migraine without aura and without status migrainosus, not intractable 05/09/2015  . Dizziness and giddiness 05/25/2013  . Gait disturbance 05/25/2013  . Paresthesia 04/29/2013  . Back pain 04/29/2013  . Hypertension   . Migraine   . Anxiety   . Seizure (HCC) 04/16/2013  . Seizures (HCC) 04/16/2013  . Dehydration 04/16/2013  . Sprain of ankle, unspecified site 04/23/2012    Past Surgical History:  Procedure Laterality Date  . KNEE SURGERY Right         Home Medications    Prior to Admission medications   Medication Sig Start Date End Date Taking? Authorizing Provider  acetaminophen (TYLENOL) 500 MG  tablet Take 1,000 mg by mouth every 8 (eight) hours as needed for mild pain.    Yes [provider]  cyclobenzaprine (FLEXERIL) 5 MG tablet Take 1 tablet (5 mg total) by mouth every 8 (eight) hours as needed for muscle spasms. 03/11/16  Yes Van Clines, MD  lamoTRIgine (LAMICTAL) 200 MG tablet Take 1 tablet (200 mg total) by mouth daily. Take 1 tablet each morning and 1 1/2 tablets each night 06/29/17  Yes Van Clines, MD  zonisamide [facility]) 100 MG capsule SEE NOTES Patient taking differently: 200 MG at night  10/21/16  Yes Van Clines, MD    Family History Family History  Problem Relation Age of Onset  . Hypertension Father   . Colon cancer Maternal Grandfather     Social History Social History   Tobacco Use  . Smoking status: Never Smoker  . Smokeless tobacco: Never Used  Substance Use Topics  . Alcohol use: Yes    Comment: occasionally  . Drug use: No     Allergies   Depakote er [divalproex sodium er]; Dilantin [phenytoin sodium extended]; Adhesive [tape]; Tapentadol; and Tramadol   Review of Systems Review of Systems  Constitutional: Negative for chills and fever.  Eyes: Negative for visual disturbance.  Musculoskeletal: Positive for arthralgias and back pain.  Neurological: Positive for dizziness, seizures, weakness, numbness and headaches.  All other systems reviewed and are negative.    Physical Exam Updated Vital Signs BP 116/82   Pulse 100   Temp (!) 101.1 F (38.4 C) (Rectal)   Resp (!) 30   Ht 5\' 11"  (1.803 m)   Wt 122.5 kg (270 lb)   SpO2 93%   BMI 37.66 kg/m   Physical Exam  Constitutional: He is oriented to person, place, and time. He appears well-developed and well-nourished. No distress.  HENT:  Head: Normocephalic.  Superficial abrasions noted to the lateral edges of the tongue bilaterally, no active bleeding.  Tongue is moderately swollen. Tolerating secretions without difficulty. No Battle's signs, no raccoon's eyes, no rhinorrhea. No hemotympanum. No tenderness to palpation of the face or skull. No deformity, crepitus, or swelling noted.   Eyes: Conjunctivae are normal. Right eye exhibits no discharge. Left eye exhibits no discharge.  Neck: Normal range of motion. Neck supple. No JVD present. No tracheal deviation present.  No midline spine TTP, no paraspinal muscle tenderness, no deformity, crepitus, or step-off noted   Cardiovascular: Normal rate and intact distal pulses.  2+ radial and DP/PT pulses bilaterally  Pulmonary/Chest: Effort  normal.  Abdominal: Soft. Bowel sounds are normal. He exhibits no distension. There is no tenderness. There is no guarding.  Musculoskeletal: He exhibits tenderness. He exhibits no edema.  There is tenderness to palpation of the right shoulder at the acromioclavicular joint and bicipital tendon groove but no obvious deformity.  Patient is unable to range the right upper extremity secondary to pain.  He has diffuse midline thoracic and lumbar spine tenderness with no focal tenderness.  There is associated right para thoracic and paralumbar muscle  tenderness and spasm.  No deformity, crepitus, or step-off noted.  Patient has difficulty with bilateral hip flexors but grip strength and plantarflexion and dorsiflexion and EHL strength is intact.  Neurological: He is alert and oriented to person, place, and time. A sensory deficit is present. No cranial nerve deficit.  Unable to range the right upper extremity but good grip strength bilaterally.  He exhibits fluent speech with no aphasia, mild dysarthria secondary to  swollen tongue, no facial droop.  He endorses altered sensation to soft touch of the right upper extremity and the right side of the face but not in the lower extremities.  Cranial Nerves:  II:  Peripheral visual fields grossly normal, pupils equal, round, reactive to light III,IV, VI: ptosis not present, extra-ocular motions intact bilaterally  V,VII: smile symmetric, facial light touch sensation equal VIII: hearing grossly normal to voice  X: uvula elevates symmetrically  XI: bilateral shoulder shrug symmetric and strong XII: midline tongue extension without fasciculations   Cerebellar: mild dysmetria with finger-to-nose of left upper extremity, unable to assess with right upper extremity secondary to shoulder pain.  No nystagmus.    Skin: Skin is warm and dry. No erythema.  Psychiatric: He has a normal mood and affect. His behavior is normal.  Nursing note and vitals  reviewed.    ED Treatments / Results  Labs (all labs ordered are listed, but only abnormal results are displayed) Labs Reviewed  CBC WITH DIFFERENTIAL/PLATELET - Abnormal; Notable for the following components:      Result Value   WBC 15.4 (*)    RBC 6.16 (*)    Hemoglobin 17.4 (*)    Neutro Abs 13.1 (*)    Monocytes Absolute 1.1 (*)    All other components within normal limits  COMPREHENSIVE METABOLIC PANEL - Abnormal; Notable for the following components:   CO2 20 (*)    Glucose, Bld 115 (*)    All other components within normal limits  URINALYSIS, ROUTINE W REFLEX MICROSCOPIC - Abnormal; Notable for the following components:   Color, Urine STRAW (*)    Hgb urine dipstick MODERATE (*)    Bacteria, UA RARE (*)    All other components within normal limits  I-STAT CG4 LACTIC ACID, ED - Abnormal; Notable for the following components:   Lactic Acid, Venous 8.04 (*)    All other components within normal limits  CSF CULTURE  CULTURE, BLOOD (ROUTINE X 2)  CULTURE, BLOOD (ROUTINE X 2)  GRAM STAIN  MAGNESIUM  LAMOTRIGINE LEVEL  HIV ANTIBODY (ROUTINE TESTING)  HSV(HERPES SMPLX VRS)ABS-I+II(IGG)-CSF  HERPES SIMPLEX VIRUS(HSV) DNA BY PCR  I-STAT CG4 LACTIC ACID, ED    EKG None  Radiology Dg Chest 2 View  Result Date: 08/14/2017 CLINICAL DATA:  Seizure EXAM: CHEST - 2 VIEW COMPARISON:  10/23/2015 FINDINGS: Chronic elevation of the right hemidiaphragm. Poor inspiration today. Chronic volume loss at the right lung base, but with accentuation due to the poor inspiration. Can't rule out active basilar atelectasis or pneumonia. Upper lungs are clear. IMPRESSION: Poor inspiration. Chronic elevation of the right hemidiaphragm. Chronic volume loss at the right base. Volume loss at both lung bases is accentuated due to the poor inspiration. Can't rule out basilar atelectasis or pneumonia. Electronically Signed   By: Paulina Fusi M.D.   On: 08/14/2017 09:29   Dg Shoulder Right  Result Date:  08/14/2017 CLINICAL DATA:  32 year old male status post seizures, severe right shoulder and arm pain. EXAM: RIGHT SHOULDER - 2+ VIEW COMPARISON:  12/24/2015 FINDINGS: Bone mineralization is within normal limits. There is no evidence of fracture or dislocation. There is no evidence of arthropathy or other focal bone abnormality. Soft tissues are unremarkable. IMPRESSION: Negative. Electronically Signed   By: Odessa Fleming M.D.   On: 08/14/2017 07:46   Ct Head Wo Contrast  Result Date: 08/14/2017 CLINICAL DATA:  Patient was in bed and had seizures. Patient has known seizure disorder. Evaluate for intracranial abnormality or  cervical spine ligamentous injury. EXAM: CT HEAD WITHOUT CONTRAST CT CERVICAL SPINE WITHOUT CONTRAST TECHNIQUE: Multidetector CT imaging of the head and cervical spine was performed following the standard protocol without intravenous contrast. Multiplanar CT image reconstructions of the cervical spine were also generated. COMPARISON:  CT head 12/24/2015. FINDINGS: The patient was unable to remain motionless for the exam. Small or subtle lesions could be overlooked. CT HEAD FINDINGS Brain: No evidence for acute infarction, hemorrhage, mass lesion, hydrocephalus, or extra-axial fluid. Normal for age cerebral volume. No definite white matter disease. Vascular: No hyperdense vessel or unexpected calcification. Skull: Normal. Negative for fracture or focal lesion. Sinuses/Orbits: No acute finding. Other: Compared with prior head CT, no change. CT CERVICAL SPINE FINDINGS Significant motion degradation. Alignment: Straightening of the normal cervical lordosis. Skull base and vertebrae: No definite fracture on this motion degraded scan. Soft tissues and spinal canal: No definite prevertebral fluid. Cannot assess for canal hematoma. Disc levels:  Intervertebral disc spaces are preserved. Upper chest: Respiratory motion. No visible pneumothorax or large mass. Other: None. IMPRESSION: Unremarkable CT head without  contrast. No skull fracture, intracranial hemorrhage, or visible mass lesion on this noncontrast exam. Motion degraded cervical spine CT demonstrating no gross fracture or subluxation. Electronically Signed   By: Elsie Stain M.D.   On: 08/14/2017 08:21   Ct Cervical Spine Wo Contrast  Result Date: 08/14/2017 CLINICAL DATA:  Patient was in bed and had seizures. Patient has known seizure disorder. Evaluate for intracranial abnormality or cervical spine ligamentous injury. EXAM: CT HEAD WITHOUT CONTRAST CT CERVICAL SPINE WITHOUT CONTRAST TECHNIQUE: Multidetector CT imaging of the head and cervical spine was performed following the standard protocol without intravenous contrast. Multiplanar CT image reconstructions of the cervical spine were also generated. COMPARISON:  CT head 12/24/2015. FINDINGS: The patient was unable to remain motionless for the exam. Small or subtle lesions could be overlooked. CT HEAD FINDINGS Brain: No evidence for acute infarction, hemorrhage, mass lesion, hydrocephalus, or extra-axial fluid. Normal for age cerebral volume. No definite white matter disease. Vascular: No hyperdense vessel or unexpected calcification. Skull: Normal. Negative for fracture or focal lesion. Sinuses/Orbits: No acute finding. Other: Compared with prior head CT, no change. CT CERVICAL SPINE FINDINGS Significant motion degradation. Alignment: Straightening of the normal cervical lordosis. Skull base and vertebrae: No definite fracture on this motion degraded scan. Soft tissues and spinal canal: No definite prevertebral fluid. Cannot assess for canal hematoma. Disc levels:  Intervertebral disc spaces are preserved. Upper chest: Respiratory motion. No visible pneumothorax or large mass. Other: None. IMPRESSION: Unremarkable CT head without contrast. No skull fracture, intracranial hemorrhage, or visible mass lesion on this noncontrast exam. Motion degraded cervical spine CT demonstrating no gross fracture or  subluxation. Electronically Signed   By: Elsie Stain M.D.   On: 08/14/2017 08:21   Dg Humerus Right  Result Date: 08/14/2017 CLINICAL DATA:  Acute right arm pain after multiple seizures last night. EXAM: RIGHT HUMERUS - 2+ VIEW COMPARISON:  None. FINDINGS: There is no evidence of fracture or other focal bone lesions. Soft tissues are unremarkable. IMPRESSION: Normal right humerus. Electronically Signed   By: Lupita Raider, M.D.   On: 08/14/2017 07:50    Procedures .Lumbar Puncture Date/Time: 08/14/2017 11:02 AM Performed by: Jeanie Sewer, PA-C Authorized by: Jeanie Sewer, PA-C   Consent:    Consent obtained:  Verbal   Consent given by:  Spouse   Risks discussed:  Bleeding, infection, pain, repeat procedure, nerve damage and headache Universal  protocol:    Procedure explained and questions answered to patient or proxy's satisfaction: yes     Imaging studies available: yes     Patient identity confirmed:  Verbally with patient and arm band Pre-procedure details:    Procedure purpose:  Diagnostic   Preparation: Patient was prepped and draped in usual sterile fashion   Anesthesia (see MAR for exact dosages):    Anesthesia method:  Local infiltration   Local anesthetic:  Lidocaine 1% w/o epi Procedure details:    Lumbar space:  L4-L5 interspace   Patient position:  L lateral decubitus   Needle gauge:  18   Needle type:  Diamond point   Needle length (in):  3.5   Ultrasound guidance: no     Number of attempts:  1   Fluid appearance:  Blood-tinged   Tubes of fluid:  1   Total volume (ml):  1 Post-procedure:    Puncture site:  Adhesive bandage applied and direct pressure applied   Patient tolerance of procedure:  Tolerated well, no immediate complications   (including critical care time)  Medications Ordered in ED Medications  lamoTRIgine (LAMICTAL) tablet 200 mg (200 mg Oral Given 08/14/17 1417)  levETIRAcetam (KEPPRA) IVPB 500 mg/100 mL premix (has no administration in time  range)  0.9 %  sodium chloride infusion (1,000 mLs Intravenous New Bag/Given 08/14/17 1052)  enoxaparin (LOVENOX) injection 40 mg (has no administration in time range)  cefTRIAXone (ROCEPHIN) 2 g in sodium chloride 0.9 % 100 mL IVPB (has no administration in time range)  acyclovir (ZOVIRAX) 940 mg in dextrose 5 % 150 mL IVPB (has no administration in time range)  LORazepam (ATIVAN) injection 2 mg (has no administration in time range)  acetaminophen (TYLENOL) tablet 650 mg (has no administration in time range)  vancomycin (VANCOCIN) 1,250 mg in sodium chloride 0.9 % 250 mL IVPB (has no administration in time range)  acetaminophen (TYLENOL) tablet 650 mg (has no administration in time range)  LORazepam (ATIVAN) injection 1 mg (1 mg Intravenous Given 08/14/17 0812)  acetaminophen (TYLENOL) suppository 650 mg (650 mg Rectal Given 08/14/17 0825)  levETIRAcetam (KEPPRA) IVPB 1000 mg/100 mL premix (0 mg Intravenous Stopped 08/14/17 0854)  vancomycin (VANCOCIN) IVPB 1000 mg/200 mL premix (0 mg Intravenous Stopped 08/14/17 1158)  cefTRIAXone (ROCEPHIN) 2 g in sodium chloride 0.9 % 100 mL IVPB (0 g Intravenous Stopped 08/14/17 1127)  acyclovir (ZOVIRAX) 940 mg in dextrose 5 % 150 mL IVPB (940 mg Intravenous New Bag/Given 08/14/17 1121)  vancomycin (VANCOCIN) 1,500 mg in sodium chloride 0.9 % 500 mL IVPB (0 mg Intravenous Stopped 08/14/17 1439)  morphine 4 MG/ML injection 4 mg (4 mg Intravenous Given 08/14/17 1414)     Initial Impression / Assessment and Plan / ED Course  I have reviewed the triage vital signs and the nursing notes.  Pertinent labs & imaging results that were available during my care of the patient were reviewed by me and considered in my medical decision making (see chart for details).  Patient presents for evaluation of seizures.  Febrile to 100.8 F with improvement with Tylenol.  He was initially hypertensive with improvement while in the ED.  He did have an episode of seizure-like activity while in  the CT scanner.  On my initial examination he appears to be postictal, does have superficial abrasions to the tongue.  He is hesitant to move his right shoulder secondary to pain.  Will obtain x-rays and CT of the head for further evaluation.  X-rays  show no evidence of acute osseous abnormality including fracture dislocation of the right shoulder.  No acute abnormalities on head CT or cervical spine CT.  Lab work reviewed by me significant for elevated lactate of 8.0 for which normalized on reevaluation.  This is likely secondary to his seizures.  He has no significant electrolyte abnormalities, LFTs and creatinine are within normal limits.  Magnesium is normal.  UA is not concerning for UTI or nephrolithiasis.  8:24 AM Spoke with Dr. Otelia Limes with neurology, who recommends 1000 mg loading dose of IV Keppra.  He recommends following up with 250 mg of Keppra twice daily.  I spoke with the patient's wife who states that when he was on Keppra he was quite sleepy and occasionally aggressive which is why the medication was discontinued.  He tolerated the IV dose of Keppra without difficulty. Spoke with Andres Labrum, neurology PA who recommends proceeding with lumbar puncture to rule out meningitis.  If lumbar puncture is unsuccessful, he still recommends transfer to [facility] and IR can attempt lumbar puncture.  I spoke with the patient's wife who consented to the procedure.  I was able to successfully obtain approximate 1 mL of cerebrospinal fluid but unable to obtain any more than that.  CSF culture shows no organisms seen but WBCs are present. Proceeded with administration of empiric antibiotic and antiviral therapy.  Code sepsis was called.  11:01 AM Spoke with Dr. Renford Dills with [facility] hospitalist service who agrees to assume care of patient and bring him into the hospital for further evaluation and management.  The plan is to transfer the patient to [facility] as soon as a bed is available and IR  will obtain lumbar puncture.  CRITICAL CARE Performed by: Jeanie Sewer   Total critical care time: 40 minutes  Critical care time was exclusive of separately billable procedures and treating other patients.  Critical care was necessary to treat or prevent imminent or life-threatening deterioration.  Critical care was time spent personally by me on the following activities: development of treatment plan with patient and/or surrogate as well as nursing, discussions with consultants, evaluation of patient's response to treatment, examination of patient, obtaining history from patient or surrogate, ordering and performing treatments and interventions, ordering and review of laboratory studies, ordering and review of radiographic studies, pulse oximetry and re-evaluation of patient's condition.   Final Clinical Impressions(s) / ED Diagnoses   Final diagnoses:  Meningitis    ED Discharge Orders    None       Bennye Alm 08/14/17 1527    Lorre Nick, MD 08/15/17 1154

## 2017-08-14 NOTE — ED Notes (Signed)
Report given to Keith, RN.

## 2017-08-14 NOTE — ED Notes (Signed)
[place], [place] aware of patient CSF culture results.

## 2017-08-14 NOTE — ED Notes (Signed)
PA said get maybe the rectal temp later

## 2017-08-14 NOTE — Consult Note (Addendum)
NEURO HOSPITALIST CONSULT NOTE   Requestig physician: Dr. Freida Busman  Reason for Consult: Seizure  History obtained from: Chart and patient   HPI:                                                                                                                                          Peter Barr is an 32 y.o. male with known seizure disorder.  He is on Lamictal and Zonegran at home.  He states that he has missed 2 days of his medication secondary to forgetting to refill it. He comes in with a seizure. The patient is still postictal along with having a swollen tongue thus has significant dysarthria and hard to understand.  Per chart his wife was called and stated that around 3:30 in the morning the patient had gotten up to go to the bathroom.  Apparently there was a loud sound and she found him convulsing in the bathroom.  This consisted of tonic-clonic activity during which he bit both sides of his tongue along the lateral aspects.  At approximately 4 AM he had a second tonic-clonic seizure that lasted about 30 seconds.  A snapping sound was apparently heard at that time; however, x-ray did not show a dislocated shoulder..  Noting in the chart that he has a demargination of white blood cell count of 15 along with neutrophil count of 13 I did ask him if he has been having a painful neck and feeling ill.  He did endorse having a painful and stiff neck that started 2 days ago and progressively getting worse.  He denies being around anybody was been ill or has had meningitis.  He states he is usually compliant with his medications however forgot to refill them at this time.  According to the chart patient has had an EEG in the past which showed left temporal epileptiform discharges.  His MRI brain was normal.  His seizures are usually focal to bilateral tonic-clonic.  He underwent LTM at [facility] on 7/12 through 7/18 of last year which showed a normal background with no interictal discharges  or electrographic seizures.  He was recently discharged from his neurology practice due to their no-show policy. He was given refills of his medication through next month to give him time to find a neurologist.  He typically has a tonic clonic seizure about every 6 months, but has absence seizures daily. He states that he has not had recent cough, runny nose or other symptoms of infection.   Currently on Lamictal 200 mg in the morning and 300 mg at night.  This is been restarted. Also on Zonegran 200 mg qhs. During an admission previously at [facility] he was taking the same dose of Lamictal but was on a higher dose of Zonegran at  400 mg qhs. Lamictal is listed in the [facility] note as having been ineffective at 200 mg BID. Phenytoin per the [facility] note resulted in burning during infusion. Valproate was not listed as having side effect there, but is listed in Epic as being a medication allergy.   In the ED he was loaded with 1000 mg IV Keppra and started on 250 mg BID. Per notes in care everywhere he is not able to tolerate Keppra dosing above 500 mg BID. Per notes from a recent admission at [facility], the next step in anticonvulsant regimen if he had breakthrough seizures was to be either Onfi or fycompa.    LP in [facility] ED with only a small amount of CSF obtained and no labs are available from the sample except for a pending CSF culture. Repeat LP under fluoroscopy has been scheduled.    Past Medical History:  Diagnosis Date  . Anxiety   . Complication of anesthesia    " one time I had a convulsion "  . Hypertension   . Migraine   . Seizures (HCC)     Past Surgical History:  Procedure Laterality Date  . KNEE SURGERY Right     Family History  Problem Relation Age of Onset  . Hypertension Father   . Colon cancer Maternal Grandfather           Social History:  reports that he has never smoked. He has never used smokeless tobacco. He reports that he drinks alcohol. He reports that he does not use  drugs.  Allergies  Allergen Reactions  . Depakote Er [Divalproex Sodium Er] Other (See Comments)    Caused burns on arm  . Dilantin [Phenytoin Sodium Extended] Other (See Comments)    Caused burns on arm   . Adhesive [Tape] Itching and Rash    Heart monitor sticky pads (leads)  . Tapentadol Itching and Rash    Heart monitor sticky pads (leads)  . Tramadol Palpitations    Spasms, dizzy (also)    MEDICATIONS:                                                                                                                     Current Facility-Administered Medications  Medication Dose Route Frequency Provider Last Rate Last Dose  . lamoTRIgine (LAMICTAL) tablet 200 mg  200 mg Oral Daily Ulice Dash, PA-C      . levETIRAcetam (KEPPRA) IVPB 500 mg/100 mL premix  500 mg Intravenous Q12H Ulice Dash, PA-C       Current Outpatient Medications  Medication Sig Dispense Refill  . acetaminophen (TYLENOL) 500 MG tablet Take 1,000 mg by mouth every 8 (eight) hours as needed for mild pain.     . cyclobenzaprine (FLEXERIL) 5 MG tablet Take 1 tablet (5 mg total) by mouth every 8 (eight) hours as needed for muscle spasms. 30 tablet 0  . lamoTRIgine (LAMICTAL) 200 MG tablet Take 1 tablet (200 mg total) by mouth daily. Take 1 tablet each morning  and 1 1/2 tablets each night 75 tablet 1  . zonisamide (ZONEGRAN) 100 MG capsule SEE NOTES (Patient taking differently: 200 MG at night) 270 capsule 3  . albuterol (PROVENTIL HFA;VENTOLIN HFA) 108 (90 Base) MCG/ACT inhaler Inhale 1-2 puffs into the lungs every 6 (six) hours as needed for wheezing or shortness of breath. (Patient not taking: Reported on 09/18/2016) 1 Inhaler 0  . benzonatate (TESSALON) 100 MG capsule Take 1 capsule (100 mg total) by mouth every 8 (eight) hours. (Patient not taking: Reported on 08/14/2017) 21 capsule 0  . cyclobenzaprine (FLEXERIL) 5 MG tablet TAKE 1 TABLET(5 MG) BY MOUTH EVERY 8 HOURS AS NEEDED FOR MUSCLE SPASMS (Patient not  taking: Reported on 08/14/2017) 270 tablet 3  . fluticasone (FLONASE) 50 MCG/ACT nasal spray SHAKE LIQUID AND USE 1 SPRAY IN EACH NOSTRIL DAILY (Patient not taking: Reported on 08/14/2017) 1 g 0  . methocarbamol (ROBAXIN) 500 MG tablet Take 1 tablet (500 mg total) by mouth 2 (two) times daily as needed for muscle spasms. (Patient not taking: Reported on 09/18/2016) 15 tablet 0  . Vitamin D, Ergocalciferol, (DRISDOL) 50000 units CAPS capsule Take one capsule once a week for 8 weeks. (Patient not taking: Reported on 09/18/2016) 8 capsule 0      ROS:                                                                                                                                       History obtained from the patient  General ROS: negative for - chills, fatigue, fever, night sweats, weight gain or weight loss Psychological ROS: negative for - behavioral disorder, hallucinations, memory difficulties, mood swings or suicidal ideation Ophthalmic ROS: negative for - blurry vision, double vision, eye pain or loss of vision ENT ROS: Positive for - epistaxis, nasal discharge, bilateral excoriations of tongue secondary to biting his tongue during seizure, sore throat, tinnitus or vertigo Allergy and Immunology ROS: negative for - hives or itchy/watery eyes Hematological and Lymphatic ROS: negative for - bleeding problems, bruising or swollen lymph nodes Endocrine ROS: negative for - galactorrhea, hair pattern changes, polydipsia/polyuria or temperature intolerance Respiratory ROS: negative for - cough, hemoptysis, shortness of breath or wheezing Cardiovascular ROS: negative for - chest pain, dyspnea on exertion, edema or irregular heartbeat Gastrointestinal ROS: negative for - abdominal pain, diarrhea, hematemesis, nausea/vomiting or stool incontinence Genito-Urinary ROS: negative for - dysuria, hematuria, incontinence or urinary frequency/urgency Musculoskeletal ROS: Positive for -musculoskeletal pain secondary  to seizure and right  Shoulder pain Neurological ROS: as noted in HPI Dermatological ROS: negative for rash and skin lesion changes   Blood pressure (!) 162/92, pulse (!) 118, temperature (!) 100.6 F (38.1 C), temperature source Rectal, resp. rate (!) 32, height 5\' 11"  (1.803 m), weight 122.5 kg (270 lb), SpO2 95 %.   General Examination:  Physical Exam  HEENT-  Normocephalic, no lesions, without obvious abnormality.  Normal external eye and conjunctiva.   Abdomen- All 4 quadrants palpated and nontender Extremities- Warm, dry and intact Musculoskeletal-positive for musculoskeletal tenderness and no neck tenderness Skin-warm and dry, no hyperpigmentation, vitiligo, or suspicious lesions  Neurological Examination Mental Status: Alert, oriented to hospital but not month or year however he is still postictal,  Speech dysarthric however he has a significantly swollen tongue without evidence of aphasia.  Able to follow simple commands to the best of his ability as he is drowsy and still postictal. Cranial Nerves: II: Visual fields grossly normal,  III,IV, VI: ptosis not present, extra-ocular motions intact bilaterally pupils equal, round, reactive to light and accommodation V,VII: smile symmetric, facial light touch sensation normal bilaterally VIII: hearing normal bilaterally IX,X: uvula rises symmetrically XI: bilateral shoulder shrug XII: midline tongue extension Motor: Right : Upper extremity   3/5 see note  left:     Upper extremity   3/5  Lower extremity   3/5     Lower extremity   3/5 Note: Patient has significant pain in his right shoulder thus is very difficult to move his shoulder without significant pain.  Patient is also complaining of back pain and neck pain.  Patient also states that he has significant soreness throughout within his muscles. Tone and bulk:normal tone  throughout; no atrophy noted Sensory: Pinprick and light touch intact throughout, bilaterally Deep Tendon Reflexes: 2+ and symmetric throughout upper extremities with 1+ bilateral knee jerks and no ankle jerks Plantars: Mute bilaterally Cerebellar: Due to significant muscle soreness he was unable to do finger-to-nose on the left and heel-to-shin bilaterally Gait: Not tested   Lab Results: Basic Metabolic Panel: Recent Labs  Lab 08/14/17 0723  NA 139  K 4.0  CL 111  CO2 20*  GLUCOSE 115*  BUN 12  CREATININE 1.11  CALCIUM 9.2  MG 2.1    CBC: Recent Labs  Lab 08/14/17 0723  WBC 15.4*  NEUTROABS 13.1*  HGB 17.4*  HCT 50.3  MCV 81.7  PLT 234    Cardiac Enzymes: No results for input(s): CKTOTAL, CKMB, CKMBINDEX, TROPONINI in the last 168 hours.  Lipid Panel: No results for input(s): CHOL, TRIG, HDL, CHOLHDL, VLDL, LDLCALC in the last 168 hours.  Imaging: Dg Shoulder Right  Result Date: 08/14/2017 CLINICAL DATA:  32 year old male status post seizures, severe right shoulder and arm pain. EXAM: RIGHT SHOULDER - 2+ VIEW COMPARISON:  12/24/2015 FINDINGS: Bone mineralization is within normal limits. There is no evidence of fracture or dislocation. There is no evidence of arthropathy or other focal bone abnormality. Soft tissues are unremarkable. IMPRESSION: Negative. Electronically Signed   By: Odessa FlemingH  Hall M.D.   On: 08/14/2017 07:46   Ct Head Wo Contrast  Result Date: 08/14/2017 CLINICAL DATA:  Patient was in bed and had seizures. Patient has known seizure disorder. Evaluate for intracranial abnormality or cervical spine ligamentous injury. EXAM: CT HEAD WITHOUT CONTRAST CT CERVICAL SPINE WITHOUT CONTRAST TECHNIQUE: Multidetector CT imaging of the head and cervical spine was performed following the standard protocol without intravenous contrast. Multiplanar CT image reconstructions of the cervical spine were also generated. COMPARISON:  CT head 12/24/2015. FINDINGS: The patient was  unable to remain motionless for the exam. Small or subtle lesions could be overlooked. CT HEAD FINDINGS Brain: No evidence for acute infarction, hemorrhage, mass lesion, hydrocephalus, or extra-axial fluid. Normal for age cerebral volume. No definite white matter disease. Vascular: No hyperdense vessel or  unexpected calcification. Skull: Normal. Negative for fracture or focal lesion. Sinuses/Orbits: No acute finding. Other: Compared with prior head CT, no change. CT CERVICAL SPINE FINDINGS Significant motion degradation. Alignment: Straightening of the normal cervical lordosis. Skull base and vertebrae: No definite fracture on this motion degraded scan. Soft tissues and spinal canal: No definite prevertebral fluid. Cannot assess for canal hematoma. Disc levels:  Intervertebral disc spaces are preserved. Upper chest: Respiratory motion. No visible pneumothorax or large mass. Other: None. IMPRESSION: Unremarkable CT head without contrast. No skull fracture, intracranial hemorrhage, or visible mass lesion on this noncontrast exam. Motion degraded cervical spine CT demonstrating no gross fracture or subluxation. Electronically Signed   By: Elsie Stain M.D.   On: 08/14/2017 08:21   Ct Cervical Spine Wo Contrast  Result Date: 08/14/2017 CLINICAL DATA:  Patient was in bed and had seizures. Patient has known seizure disorder. Evaluate for intracranial abnormality or cervical spine ligamentous injury. EXAM: CT HEAD WITHOUT CONTRAST CT CERVICAL SPINE WITHOUT CONTRAST TECHNIQUE: Multidetector CT imaging of the head and cervical spine was performed following the standard protocol without intravenous contrast. Multiplanar CT image reconstructions of the cervical spine were also generated. COMPARISON:  CT head 12/24/2015. FINDINGS: The patient was unable to remain motionless for the exam. Small or subtle lesions could be overlooked. CT HEAD FINDINGS Brain: No evidence for acute infarction, hemorrhage, mass lesion,  hydrocephalus, or extra-axial fluid. Normal for age cerebral volume. No definite white matter disease. Vascular: No hyperdense vessel or unexpected calcification. Skull: Normal. Negative for fracture or focal lesion. Sinuses/Orbits: No acute finding. Other: Compared with prior head CT, no change. CT CERVICAL SPINE FINDINGS Significant motion degradation. Alignment: Straightening of the normal cervical lordosis. Skull base and vertebrae: No definite fracture on this motion degraded scan. Soft tissues and spinal canal: No definite prevertebral fluid. Cannot assess for canal hematoma. Disc levels:  Intervertebral disc spaces are preserved. Upper chest: Respiratory motion. No visible pneumothorax or large mass. Other: None. IMPRESSION: Unremarkable CT head without contrast. No skull fracture, intracranial hemorrhage, or visible mass lesion on this noncontrast exam. Motion degraded cervical spine CT demonstrating no gross fracture or subluxation. Electronically Signed   By: Elsie Stain M.D.   On: 08/14/2017 08:21   Dg Humerus Right  Result Date: 08/14/2017 CLINICAL DATA:  Acute right arm pain after multiple seizures last night. EXAM: RIGHT HUMERUS - 2+ VIEW COMPARISON:  None. FINDINGS: There is no evidence of fracture or other focal bone lesions. Soft tissues are unremarkable. IMPRESSION: Normal right humerus. Electronically Signed   By: Lupita Raider, M.D.   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  He currently is on 200 mg Lamictal in the morning 300 mg Lamictal at night and zonisamide 200 mg qhs.  He admits to missing 2 days worth of doses due to running out.  Patient had 2 episodes of tonic-clonic seizure this morning.   - Breakthrough seizure secondary to noncompliance - Possible  right rotator cuff tear - Possible meningitis secondary to elevation of white blood count and neutrophil count with neck discomfort   Recommendations: - EDP attempted LP. Was unable to obtain sufficient sample therefore will need LP under IR - Started empiric therapy for both bacterial and viral meningitis. - Continue home dose of Lamictal along with Zonegran.  Loaded with 1000 mg IV Keppra. Started 500 mg Keppra twice daily but this will now be discontinued as his seizures were due to noncompliance and Lamictal/Zonegran have been restarted - Transferrd to [employer] on the stepdown unit - Due to significant right shoulder pain with no dislocation would recommend MRI of shoulder to look for possible rotator cuff tear -Per [employer] statutes, patients with seizures are not allowed to drive until  they have been seizure-free for six months. Use caution when using heavy equipment or power tools. Avoid working on ladders or at heights. Take showers instead of baths. Ensure the water temperature is not too high on the home water heater. Do not go swimming alone. When caring for infants or small children, sit down when holding, feeding, or changing them to minimize risk of injury to the child in the event you have a seizure. Also, Maintain good sleep hygiene. Avoid alcohol.   This has been discussed with Dr. Freida Busman emergency medicine MD  I have seen and examined the patient. I have amended the assessment and recommendations above.  Electronically signed: Dr. Caryl Pina

## 2017-08-14 NOTE — ED Triage Notes (Signed)
Patient was in bed and had two full body seizures. No urinary incontinence. Wife witnessed seizures.

## 2017-08-14 NOTE — ED Notes (Signed)
Report given to [facility]. 

## 2017-08-15 DIAGNOSIS — G039 Meningitis, unspecified: Secondary | ICD-10-CM

## 2017-08-15 LAB — COMPREHENSIVE METABOLIC PANEL
ALT: 37 U/L (ref 17–63)
AST: 40 U/L (ref 15–41)
Albumin: 3.1 g/dL — ABNORMAL LOW (ref 3.5–5.0)
Alkaline Phosphatase: 70 U/L (ref 38–126)
Anion gap: 7 (ref 5–15)
BILIRUBIN TOTAL: 0.9 mg/dL (ref 0.3–1.2)
BUN: 6 mg/dL (ref 6–20)
CALCIUM: 8.3 mg/dL — AB (ref 8.9–10.3)
CO2: 23 mmol/L (ref 22–32)
CREATININE: 1.32 mg/dL — AB (ref 0.61–1.24)
Chloride: 110 mmol/L (ref 101–111)
GFR calc Af Amer: >60 mL/min (ref >60)
Glucose, Bld: 122 mg/dL — ABNORMAL HIGH (ref 65–99)
Potassium: 3.2 mmol/L — ABNORMAL LOW (ref 3.5–5.1)
Sodium: 140 mmol/L (ref 135–145)
TOTAL PROTEIN: 6.3 g/dL — AB (ref 6.5–8.1)

## 2017-08-15 LAB — CBC WITH DIFFERENTIAL/PLATELET
ABS IMMATURE GRANULOCYTES: 0 10*3/uL (ref 0.0–0.1)
Basophils Absolute: 0 10*3/uL (ref 0.0–0.1)
Basophils Relative: 1 %
EOS ABS: 0.1 10*3/uL (ref 0.0–0.7)
Eosinophils Relative: 2 %
HEMATOCRIT: 44.6 % (ref 39.0–52.0)
Hemoglobin: 14.7 g/dL (ref 13.0–17.0)
IMMATURE GRANULOCYTES: 0 %
LYMPHS ABS: 1.2 10*3/uL (ref 0.7–4.0)
Lymphocytes Relative: 13 %
MCH: 26.9 pg (ref 26.0–34.0)
MCHC: 33 g/dL (ref 30.0–36.0)
MCV: 81.7 fL (ref 78.0–100.0)
MONOS PCT: 8 %
Monocytes Absolute: 0.7 10*3/uL (ref 0.1–1.0)
NEUTROS PCT: 76 %
Neutro Abs: 6.7 10*3/uL (ref 1.7–7.7)
Platelets: 195 10*3/uL (ref 150–400)
RBC: 5.46 MIL/uL (ref 4.22–5.81)
RDW: 13 % (ref 11.5–15.5)
WBC: 8.8 10*3/uL (ref 4.0–10.5)

## 2017-08-15 LAB — HIV ANTIBODY (ROUTINE TESTING W REFLEX): HIV Screen 4th Generation wRfx: NONREACTIVE

## 2017-08-15 LAB — LACTIC ACID, PLASMA
LACTIC ACID, VENOUS: 1.3 mmol/L (ref 0.5–1.9)
Lactic Acid, Venous: 1.3 mmol/L (ref 0.5–1.9)

## 2017-08-15 LAB — PROTIME-INR
INR: 1.15
PROTHROMBIN TIME: 14.6 s (ref 11.4–15.2)

## 2017-08-15 LAB — MAGNESIUM: MAGNESIUM: 1.7 mg/dL (ref 1.7–2.4)

## 2017-08-15 MED ORDER — ONDANSETRON HCL 4 MG/2ML IJ SOLN
4.0000 mg | Freq: Four times a day (QID) | INTRAMUSCULAR | Status: DC | PRN
  Administered 2017-08-15 – 2017-08-16 (×3): 4 mg via INTRAVENOUS
  Filled 2017-08-15 (×3): qty 2

## 2017-08-15 MED ORDER — ENOXAPARIN SODIUM 40 MG/0.4ML ~~LOC~~ SOLN
40.0000 mg | SUBCUTANEOUS | Status: DC
  Administered 2017-08-16: 40 mg via SUBCUTANEOUS
  Filled 2017-08-15: qty 0.4

## 2017-08-15 MED ORDER — SODIUM CHLORIDE 0.9 % IV SOLN
1000.0000 mL | INTRAVENOUS | Status: AC
  Administered 2017-08-15: 1000 mL via INTRAVENOUS

## 2017-08-15 NOTE — Progress Notes (Signed)
@IPLOG @        PROGRESS NOTE                                                                                                                                                                                                             Patient Demographics:    Peter Barr, is a 32 y.o. male, DOB - Nov 28, 1985, ZOX:[id number]  Admit date - 08/14/2017   Admitting Physician Burnadette Pop, MD  Outpatient Primary MD for the patient is [name], L.[name], MD  LOS - 1  Chief Complaint  Patient presents with  . Seizures       Brief Narrative 32 year old African-American male with history of seizure disorder who came to the ER after 2 episodes of generalized tonic-clonic seizures at home, in the ER was found to be febrile, he was out of his medications for 2 days.  In the ER he was seen by neuro, LP was done unfortunately insufficient CSF was obtained.  He was empirically started on treatment for meningitis and a repeat LP was ordered, unfortunately repeat LP was canceled by IR by mistake.  It has been reordered.  He is here for further management of his seizures.   Subjective:    Peter Barr today has, mild headache, No chest pain, No abdominal pain - No Nausea, No new weakness tingling or numbness, No Cough - SOB.  No Photophobia, chronic low back pain.   Assessment  & Plan :     1.  Breakthrough seizure.  Likely due to noncompliance with his home medications which he forgot to refill.  Neuro following, currently on home dose Lamictal along with Zonegran ordered by neurology, head CT negative, initial LP did not obtain sufficient CSF and a repeat LP has been ordered through IR on 08/14/2017 unfortunately that order was canceled by mistake by IR, LP has been again reordered, clinically my suspicion for meningitis is extremely low.  Currently on empiric acyclovir, Rocephin and vancomycin combination per neurology.  As discussed with neurologist Dr. Otelia Limes on 08/15/2017.  2.  Suspected and  meningitis/encephalitis.  Currently see #1 above.    Diet :  Diet Order           Diet regular Room service appropriate? Yes; Fluid consistency: Thin  Diet effective now           Family Communication  :  None  Code Status :  Full  Disposition Plan  :  Home once better  Consults  :  Neuro, IR  Procedures  :  LP x 1 - insufficient CSF obtained  Repeat LP ordered, canceled by IR in error on 08/14/2017, reordered again on 08/15/2017    DVT Prophylaxis  :  Lovenox/ SCDs    Lab Results  Component Value Date   PLT 195 08/15/2017    Inpatient Medications  Scheduled Meds: . enoxaparin (LOVENOX) injection  40 mg Subcutaneous Q24H  . lamoTRIgine  200 mg Oral Daily  . zonisamide  200 mg Oral QHS   Continuous Infusions: . sodium chloride 1,000 mL (08/15/17 0945)  . acyclovir Stopped (08/15/17 0981)  . cefTRIAXone (ROCEPHIN)  IV 2 g (08/15/17 0945)  . vancomycin Stopped (08/15/17 0552)   PRN Meds:.acetaminophen, HYDROcodone-acetaminophen, LORazepam, magic mouthwash w/lidocaine  Antibiotics  :    Anti-infectives (From admission, onward)   Start     Dose/Rate Route Frequency Ordered Stop   08/14/17 2200  acyclovir (ZOVIRAX) 615 mg in dextrose 5 % 100 mL IVPB  Status:  Discontinued     5 mg/kg  122.5 kg 112.3 mL/hr over 60 Minutes Intravenous Every 12 hours 08/14/17 1112 08/14/17 1135   08/14/17 2200  cefTRIAXone (ROCEPHIN) 2 g in sodium chloride 0.9 % 100 mL IVPB     2 g 200 mL/hr over 30 Minutes Intravenous Every 12 hours 08/14/17 1135     08/14/17 2000  acyclovir (ZOVIRAX) 940 mg in dextrose 5 % 150 mL IVPB     10 mg/kg  94.2 kg (Adjusted) 168.8 mL/hr over 60 Minutes Intravenous Every 8 hours 08/14/17 1135     08/14/17 2000  vancomycin (VANCOCIN) 1,250 mg in sodium chloride 0.9 % 250 mL IVPB     1,250 mg 166.7 mL/hr over 90 Minutes Intravenous Every 8 hours 08/14/17 1241     08/14/17 1230  vancomycin (VANCOCIN) 1,500 mg in sodium chloride 0.9 % 500 mL IVPB      1,500 mg 250 mL/hr over 120 Minutes Intravenous  Once 08/14/17 1136 08/14/17 1439   08/14/17 1115  cefTRIAXone (ROCEPHIN) 2 g in sodium chloride 0.9 % 100 mL IVPB  Status:  Discontinued     2 g 200 mL/hr over 30 Minutes Intravenous Every 24 hours 08/14/17 1112 08/14/17 1135   08/14/17 1030  vancomycin (VANCOCIN) IVPB 1000 mg/200 mL premix     1,000 mg 200 mL/hr over 60 Minutes Intravenous  Once 08/14/17 1023 08/14/17 1158   08/14/17 1030  cefTRIAXone (ROCEPHIN) 2 g in sodium chloride 0.9 % 100 mL IVPB     2 g 200 mL/hr over 30 Minutes Intravenous  Once 08/14/17 1023 08/14/17 1127   08/14/17 1030  acyclovir (ZOVIRAX) 940 mg in dextrose 5 % 150 mL IVPB     10 mg/kg  94.2 kg (Adjusted) 168.8 mL/hr over 60 Minutes Intravenous  Once 08/14/17 1023 08/14/17 1523         Objective:   Vitals:   08/14/17 1951 08/15/17 0019 08/15/17 0433 08/15/17 0746  BP: 128/89 125/75 124/79 111/84  Pulse: 96 90 81 79  Resp: 18 18 18  (!) 24  Temp: 98.2 F (36.8 C) 98.7 F (37.1 C) 98.2 F (36.8 C) (!) 97.5 F (36.4 C)  TempSrc: Oral Oral Oral Oral  SpO2: 97% 92% 97% 93%  Weight:      Height:        Wt Readings from Last 3 Encounters:  08/14/17 122.5 kg (270 lb)  09/18/16 124.3 kg (274 lb)  02/22/16 127.5 kg (281 lb 3 oz)     Intake/Output Summary (Last 24 hours) at 08/15/2017  1024 Last data filed at 08/15/2017 0441 Gross per 24 hour  Intake 3304.27 ml  Output 580 ml  Net 2724.27 ml     Physical Exam  Awake Alert, Oriented X 3, No new F.N deficits, Normal affect,   Gideon.AT,PERRAL Supple Neck,No JVD, No cervical lymphadenopathy appriciated.  Symmetrical Chest wall movement, Good air movement bilaterally, CTAB RRR,No Gallops,Rubs or new Murmurs, No Parasternal Heave +ve B.Sounds, Abd Soft, No tenderness, No organomegaly appriciated, No rebound - guarding or rigidity. No Cyanosis, Clubbing or edema, No new Rash or bruise       Data Review:    CBC Recent Labs  Lab 08/14/17 0723  08/15/17 0904  WBC 15.4* 8.8  HGB 17.4* 14.7  HCT 50.3 44.6  PLT 234 195  MCV 81.7 81.7  MCH 28.2 26.9  MCHC 34.6 33.0  RDW 13.1 13.0  LYMPHSABS 1.1 1.2  MONOABS 1.1* 0.7  EOSABS 0.0 0.1  BASOSABS 0.0 0.0    Chemistries  Recent Labs  Lab 08/14/17 0723  NA 139  K 4.0  CL 111  CO2 20*  GLUCOSE 115*  BUN 12  CREATININE 1.11  CALCIUM 9.2  MG 2.1  AST 35  ALT 47  ALKPHOS 92  BILITOT 0.7   ------------------------------------------------------------------------------------------------------------------ No results for input(s): CHOL, HDL, LDLCALC, TRIG, CHOLHDL, LDLDIRECT in the last 72 hours.  No results found for: HGBA1C ------------------------------------------------------------------------------------------------------------------ No results for input(s): TSH, T4TOTAL, T3FREE, THYROIDAB in the last 72 hours.  Invalid input(s): FREET3 ------------------------------------------------------------------------------------------------------------------ No results for input(s): VITAMINB12, FOLATE, FERRITIN, TIBC, IRON, RETICCTPCT in the last 72 hours.  Coagulation profile Recent Labs  Lab 08/15/17 0904  INR 1.15    No results for input(s): DDIMER in the last 72 hours.  Cardiac Enzymes No results for input(s): CKMB, TROPONINI, MYOGLOBIN in the last 168 hours.  Invalid input(s): CK ------------------------------------------------------------------------------------------------------------------ No results found for: BNP  Micro Results Recent Results (from the past 240 hour(s))  CSF culture     Status: None (Preliminary result)   Collection Time: 08/14/17  9:54 AM  Result Value Ref Range Status   Specimen Description CSF  Final   Special Requests NONE  Final   Gram Stain   Final    WBC PRESENT,BOTH PMN AND MONONUCLEAR NO ORGANISMS SEEN CYTOSPIN SMEAR Gram Stain Report Called to,Read Back By and Verified With: VAUGHN,L @ 1145 ON [id number] BY POTEAT,S Performed  at [facility], [address] [place], [place] [address]    Culture PENDING  Incomplete   Report Status PENDING  Incomplete    Radiology Reports Dg Chest 2 View  Result Date: 08/14/2017 CLINICAL DATA:  Seizure EXAM: CHEST - 2 VIEW COMPARISON:  10/23/2015 FINDINGS: Chronic elevation of the right hemidiaphragm. Poor inspiration today. Chronic volume loss at the right lung base, but with accentuation due to the poor inspiration. Can't rule out active basilar atelectasis or pneumonia. Upper lungs are clear. IMPRESSION: Poor inspiration. Chronic elevation of the right hemidiaphragm. Chronic volume loss at the right base. Volume loss at both lung bases is accentuated due to the poor inspiration. Can't rule out basilar atelectasis or pneumonia. Electronically Signed   By: Paulina Fusi M.D.   On: 08/14/2017 09:29   Dg Shoulder Right  Result Date: 08/14/2017 CLINICAL DATA:  32 year old male status post seizures, severe right shoulder and arm pain. EXAM: RIGHT SHOULDER - 2+ VIEW COMPARISON:  12/24/2015 FINDINGS: Bone mineralization is within normal limits. There is no evidence of fracture or dislocation. There is no evidence of  arthropathy or other focal bone abnormality. Soft tissues are unremarkable. IMPRESSION: Negative. Electronically Signed   By: Odessa Fleming M.D.   On: 08/14/2017 07:46   Ct Head Wo Contrast  Result Date: 08/14/2017 CLINICAL DATA:  Patient was in bed and had seizures. Patient has known seizure disorder. Evaluate for intracranial abnormality or cervical spine ligamentous injury. EXAM: CT HEAD WITHOUT CONTRAST CT CERVICAL SPINE WITHOUT CONTRAST TECHNIQUE: Multidetector CT imaging of the head and cervical spine was performed following the standard protocol without intravenous contrast. Multiplanar CT image reconstructions of the cervical spine were also generated. COMPARISON:  CT head 12/24/2015. FINDINGS: The patient was unable to remain motionless for the exam. Small or  subtle lesions could be overlooked. CT HEAD FINDINGS Brain: No evidence for acute infarction, hemorrhage, mass lesion, hydrocephalus, or extra-axial fluid. Normal for age cerebral volume. No definite white matter disease. Vascular: No hyperdense vessel or unexpected calcification. Skull: Normal. Negative for fracture or focal lesion. Sinuses/Orbits: No acute finding. Other: Compared with prior head CT, no change. CT CERVICAL SPINE FINDINGS Significant motion degradation. Alignment: Straightening of the normal cervical lordosis. Skull base and vertebrae: No definite fracture on this motion degraded scan. Soft tissues and spinal canal: No definite prevertebral fluid. Cannot assess for canal hematoma. Disc levels:  Intervertebral disc spaces are preserved. Upper chest: Respiratory motion. No visible pneumothorax or large mass. Other: None. IMPRESSION: Unremarkable CT head without contrast. No skull fracture, intracranial hemorrhage, or visible mass lesion on this noncontrast exam. Motion degraded cervical spine CT demonstrating no gross fracture or subluxation. Electronically Signed   By: Elsie Stain M.D.   On: 08/14/2017 08:21   Ct Cervical Spine Wo Contrast  Result Date: 08/14/2017 CLINICAL DATA:  Patient was in bed and had seizures. Patient has known seizure disorder. Evaluate for intracranial abnormality or cervical spine ligamentous injury. EXAM: CT HEAD WITHOUT CONTRAST CT CERVICAL SPINE WITHOUT CONTRAST TECHNIQUE: Multidetector CT imaging of the head and cervical spine was performed following the standard protocol without intravenous contrast. Multiplanar CT image reconstructions of the cervical spine were also generated. COMPARISON:  CT head 12/24/2015. FINDINGS: The patient was unable to remain motionless for the exam. Small or subtle lesions could be overlooked. CT HEAD FINDINGS Brain: No evidence for acute infarction, hemorrhage, mass lesion, hydrocephalus, or extra-axial fluid. Normal for age cerebral  volume. No definite white matter disease. Vascular: No hyperdense vessel or unexpected calcification. Skull: Normal. Negative for fracture or focal lesion. Sinuses/Orbits: No acute finding. Other: Compared with prior head CT, no change. CT CERVICAL SPINE FINDINGS Significant motion degradation. Alignment: Straightening of the normal cervical lordosis. Skull base and vertebrae: No definite fracture on this motion degraded scan. Soft tissues and spinal canal: No definite prevertebral fluid. Cannot assess for canal hematoma. Disc levels:  Intervertebral disc spaces are preserved. Upper chest: Respiratory motion. No visible pneumothorax or large mass. Other: None. IMPRESSION: Unremarkable CT head without contrast. No skull fracture, intracranial hemorrhage, or visible mass lesion on this noncontrast exam. Motion degraded cervical spine CT demonstrating no gross fracture or subluxation. Electronically Signed   By: Elsie Stain M.D.   On: 08/14/2017 08:21   Dg Humerus Right  Result Date: 08/14/2017 CLINICAL DATA:  Acute right arm pain after multiple seizures last night. EXAM: RIGHT HUMERUS - 2+ VIEW COMPARISON:  None. FINDINGS: There is no evidence of fracture or other focal bone lesions. Soft tissues are unremarkable. IMPRESSION: Normal right humerus. Electronically Signed   By: Lupita Raider, M.D.   On:  08/14/2017 07:50    Time Spent in minutes  30   Susa RaringPrashant Vidal Lampkins M.D on 08/15/2017 at 10:24 AM  Between 7am to 7pm - Pager - [phone] ( page via [email or web address], text pages only, please mention full 10 digit call back number). After 7pm go to [email or web address] - password [email or web address]

## 2017-08-15 NOTE — Plan of Care (Signed)
Peter Barr has had no further seizure activity that we are aware.  He is still drowsy, napping a lot and complains of headache.  Repeat lumbar puncture will be completed on Monday unless emergent.  Dr. Thedore MinsSingh aware.  The patient continues on antibiotics, antivirals and maintenance fluid.  Seizure precautions remain in place, and the patient is trusted to call when he needs assistance.

## 2017-08-16 LAB — BASIC METABOLIC PANEL
Anion gap: 6 (ref 5–15)
CO2: 24 mmol/L (ref 22–32)
Calcium: 8.4 mg/dL — ABNORMAL LOW (ref 8.9–10.3)
Chloride: 108 mmol/L (ref 101–111)
Creatinine, Ser: 1.29 mg/dL — ABNORMAL HIGH (ref 0.61–1.24)
GFR calc Af Amer: >60 mL/min (ref >60)
GLUCOSE: 131 mg/dL — AB (ref 65–99)
POTASSIUM: 3.4 mmol/L — AB (ref 3.5–5.1)
Sodium: 138 mmol/L (ref 135–145)

## 2017-08-16 LAB — CBC
HCT: 46.9 % (ref 39.0–52.0)
Hemoglobin: 15.3 g/dL (ref 13.0–17.0)
MCH: 26.7 pg (ref 26.0–34.0)
MCHC: 32.6 g/dL (ref 30.0–36.0)
MCV: 81.8 fL (ref 78.0–100.0)
Platelets: 223 10*3/uL (ref 150–400)
RBC: 5.73 MIL/uL (ref 4.22–5.81)
RDW: 13 % (ref 11.5–15.5)
WBC: 8.3 10*3/uL (ref 4.0–10.5)

## 2017-08-16 LAB — VANCOMYCIN, TROUGH: VANCOMYCIN TR: 18 ug/mL (ref 15–20)

## 2017-08-16 LAB — MAGNESIUM: Magnesium: 1.9 mg/dL (ref 1.7–2.4)

## 2017-08-16 MED ORDER — POTASSIUM CHLORIDE CRYS ER 20 MEQ PO TBCR
40.0000 meq | EXTENDED_RELEASE_TABLET | Freq: Once | ORAL | Status: AC
  Administered 2017-08-16: 40 meq via ORAL
  Filled 2017-08-16: qty 2

## 2017-08-16 MED ORDER — ENOXAPARIN SODIUM 40 MG/0.4ML ~~LOC~~ SOLN: 40.0000 mg | Freq: Once | SUBCUTANEOUS | Status: DC

## 2017-08-16 MED ORDER — IBUPROFEN 200 MG PO TABS
600.0000 mg | ORAL_TABLET | Freq: Four times a day (QID) | ORAL | Status: DC | PRN
  Administered 2017-08-16 – 2017-08-17 (×5): 600 mg via ORAL
  Filled 2017-08-16 (×5): qty 3

## 2017-08-16 MED ORDER — ENOXAPARIN SODIUM 40 MG/0.4ML ~~LOC~~ SOLN
40.0000 mg | Freq: Once | SUBCUTANEOUS | Status: DC
  Filled 2017-08-16: qty 0.4

## 2017-08-16 MED ORDER — PANTOPRAZOLE SODIUM 40 MG PO TBEC
40.0000 mg | DELAYED_RELEASE_TABLET | Freq: Every day | ORAL | Status: DC
  Administered 2017-08-16 – 2017-08-21 (×6): 40 mg via ORAL
  Filled 2017-08-16 (×6): qty 1

## 2017-08-16 MED ORDER — MAGNESIUM SULFATE IN D5W 1-5 GM/100ML-% IV SOLN
1.0000 g | Freq: Once | INTRAVENOUS | Status: AC
  Administered 2017-08-16: 1 g via INTRAVENOUS
  Filled 2017-08-16: qty 100

## 2017-08-16 MED ORDER — KETOROLAC TROMETHAMINE 30 MG/ML IJ SOLN
30.0000 mg | Freq: Once | INTRAMUSCULAR | Status: AC
  Administered 2017-08-16: 30 mg via INTRAVENOUS
  Filled 2017-08-16: qty 1

## 2017-08-16 NOTE — Progress Notes (Addendum)
Subjective: Patient in bed, dyspneic.  He is slow with responses.  Patient admits to persistent headache with dizziness and photophobia.  Dizziness when he gets out of bed. Extraocular motions and neck ovement exacerbate headache.  He admits to neck pain which radiats down to his back and shoulders.  Denies seizures overnight  Exam: Vitals:   08/16/17 0414 08/16/17 0752  BP: 132/86 130/87  Pulse: 84 79  Resp: 18 19  Temp: 98.4 F (36.9 C) 98 F (36.7 C)  SpO2: 96% 93%    Physical Exam  HEENT-  Normocephalic, no lesions, without obvious abnormality.  Normal external eye and conjunctiva.   Abdomen- All 4 quadrants palpated and nontender Extremities- Warm, dry and intact Musculoskeletal-positive for musculoskeletal tenderness and nuchal rigidity Skin-warm and dry  Neurological Examination Mental Status: Alert, oriented to hospital, and year, not to month or year; however he is still postictal,  Speech mildly dysarthric, tongue edema improved.  Slowly able to follow commands  Cranial Nerves: II: Visual fields grossly normal,  III,IV, VI: ptosis not present, extra-ocular motions intact bilaterally pupils equal, round, reactive to light and accommodation V,VII: smile symmetric, facial light touch sensation normal bilaterally VIII: hearing normal bilaterally IX,X: uvula rises symmetrically XI: bilateral shoulder shrug XII: midline tongue extension Motor: Patient with persistent neck and right shoulder pain to movement of upper extrmeities Right :  Upper extremity   4/5                                                 left:     Upper extremity   4/5             Lower extremity   4/5                                                  Lower extremity   4/5 Tone and bulk:normal tone throughout; no atrophy noted Sensory: Pinprick and light touch intact throughout, bilaterally Deep Tendon Reflexes: 2+ and symmetric throughout upper extremities with 2+ in patellar Plantars: Mute  bilaterally Cerebellar: Due to persistent right shoulder pain he was unable to do finger-to-nose on the right Gait: Not tested    Medications:  . enoxaparin (LOVENOX) injection  40 mg Subcutaneous Once  . lamoTRIgine  200 mg Oral Daily  . 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  He currently is on 200 mg Lamictal in the morning 300 mg Lamictal at night and zonisamide 200 mg qhs.  He admits to missing 2 days worth of doses prior to admission due to running out.  Patient had 2 episodes of tonic-clonic seizure at home on the day of admission but has not had any further seizures.   - Breakthrough seizure secondary to noncompliance - Possible right rotator cuff tear - Possible meningitis secondary to elevation of white blood count and neutrophil count with neck discomfort, photophobia   Recommendations: - EDP attempted LP. Was unable to obtain sufficient sample therefore will need LP under IR, scheduled for tomorrow -  Continue empiric therapy for both bacterial and viral meningitis - Rocephin, Vanc  and Acyclovir - Continue home dose of Lamictal along with Zonegran. Loaded with 1000 mg IV Keppra initially and started 500 mg Keppra twice daily but this has since been discontinued as his seizures were due to noncompliance and Lamictal/Zonegran have been restarted - Due to significant right shoulder pain with no dislocation would recommend MRI of shoulder to look for possible rotator cuff tear -Per [facility] statutes, patients with seizures are not allowed to drive until  they have been seizure-free for six months. Use caution when using heavy equipment or power tools. Avoid working on ladders or at heights. Take showers instead of baths. Ensure the water temperature is not too high on the home water heater. Do not go swimming alone. When caring for infants or small children, sit down  when holding, feeding, or changing them to minimize risk of injury to the child in the event you have a seizure. Also, Maintain good sleep hygiene. Avoid alcohol.   Noralee Space DNP Neuro-hospitalist Team [phone] 08/16/2017, 11:14 AM  Electronically signed: Dr. Caryl Pina

## 2017-08-16 NOTE — Progress Notes (Signed)
@IPLOG @        PROGRESS NOTE                                                                                                                                                                                                             Patient Demographics:    Peter Barr, is a 32 y.o. male, DOB - [birth year], WUJ:[id number]  Admit date - 08/14/2017   Admitting Physician Burnadette Pop, MD  Outpatient Primary MD for the patient is Peter Barr, L.[name], MD  LOS - 2  Chief Complaint  Patient presents with  . Seizures       Brief Narrative 32 year old African-American male with history of seizure disorder who came to the ER after 2 episodes of generalized tonic-clonic seizures at home, in the ER was found to be febrile, he was out of his medications for 2 days.  In the ER he was seen by neuro, LP was done unfortunately insufficient CSF was obtained.  He was empirically started on treatment for meningitis and a repeat LP was ordered, unfortunately repeat LP was canceled by IR by mistake.  It has been reordered.  He is here for further management of his seizures.   Subjective:   Patient in bed complains of mild generalized headache but in no distress, no photophobia or neck stiffness, chronic low back pain.  No fever, no chest or abdominal pain.   Assessment  & Plan :     1.  Breakthrough seizure.  Likely due to noncompliance with his home medications which he forgot to refill.  Neuro following, currently on home dose Lamictal along with Zonegran ordered by neurology, head CT negative, initial LP did not obtain sufficient CSF and a repeat LP has been ordered through IR on 08/14/2017 unfortunately that order was canceled by mistake by IR, LP has been again reordered and IR unable to do it till 08/17/2017, clinically my suspicion for meningitis is extremely low.  Currently on empiric acyclovir, Rocephin and vancomycin combination per neurology.  Case was discussed with neurologist Dr. Otelia Limes on  08/15/2017, agrees with the plan and present management which will be continued unchanged.  2.  Suspected and meningitis/encephalitis.  Currently see #1 above.    Diet :  Diet Order           Diet regular Room service appropriate? Yes; Fluid consistency: Thin  Diet effective now           Family Communication  :  None  Code Status :  Full  Disposition Plan  :  Home once better  Consults  :  Neuro, IR  Procedures  :    LP x 1 - insufficient CSF obtained  Repeat LP ordered, canceled by IR in error on 08/14/2017, reordered again on 08/15/2017    DVT Prophylaxis  :  Lovenox/ SCDs    Lab Results  Component Value Date   PLT 195 08/15/2017    Inpatient Medications  Scheduled Meds: . enoxaparin (LOVENOX) injection  40 mg Subcutaneous Once  . lamoTRIgine  200 mg Oral Daily  . zonisamide  200 mg Oral QHS   Continuous Infusions: . acyclovir Stopped (08/16/17 0622)  . cefTRIAXone (ROCEPHIN)  IV Stopped (08/16/17 0000)  . magnesium sulfate 1 - 4 g bolus IVPB 1 g (08/16/17 0816)  . vancomycin Stopped (08/16/17 0647)   PRN Meds:.ibuprofen, LORazepam, magic mouthwash w/lidocaine, ondansetron (ZOFRAN) IV  Antibiotics  :    Anti-infectives (From admission, onward)   Start     Dose/Rate Route Frequency Ordered Stop   08/14/17 2200  acyclovir (ZOVIRAX) 615 mg in dextrose 5 % 100 mL IVPB  Status:  Discontinued     5 mg/kg  122.5 kg 112.3 mL/hr over 60 Minutes Intravenous Every 12 hours 08/14/17 1112 08/14/17 1135   08/14/17 2200  cefTRIAXone (ROCEPHIN) 2 g in sodium chloride 0.9 % 100 mL IVPB     2 g 200 mL/hr over 30 Minutes Intravenous Every 12 hours 08/14/17 1135     08/14/17 2000  acyclovir (ZOVIRAX) 940 mg in dextrose 5 % 150 mL IVPB     10 mg/kg  94.2 kg (Adjusted) 168.8 mL/hr over 60 Minutes Intravenous Every 8 hours 08/14/17 1135     08/14/17 2000  vancomycin (VANCOCIN) 1,250 mg in sodium chloride 0.9 % 250 mL IVPB     1,250 mg 166.7 mL/hr over 90 Minutes Intravenous  Every 8 hours 08/14/17 1241     08/14/17 1230  vancomycin (VANCOCIN) 1,500 mg in sodium chloride 0.9 % 500 mL IVPB     1,500 mg 250 mL/hr over 120 Minutes Intravenous  Once 08/14/17 1136 08/14/17 1439   08/14/17 1115  cefTRIAXone (ROCEPHIN) 2 g in sodium chloride 0.9 % 100 mL IVPB  Status:  Discontinued     2 g 200 mL/hr over 30 Minutes Intravenous Every 24 hours 08/14/17 1112 08/14/17 1135   08/14/17 1030  vancomycin (VANCOCIN) IVPB 1000 mg/200 mL premix     1,000 mg 200 mL/hr over 60 Minutes Intravenous  Once 08/14/17 1023 08/14/17 1158   08/14/17 1030  cefTRIAXone (ROCEPHIN) 2 g in sodium chloride 0.9 % 100 mL IVPB     2 g 200 mL/hr over 30 Minutes Intravenous  Once 08/14/17 1023 08/14/17 1127   08/14/17 1030  acyclovir (ZOVIRAX) 940 mg in dextrose 5 % 150 mL IVPB     10 mg/kg  94.2 kg (Adjusted) 168.8 mL/hr over 60 Minutes Intravenous  Once 08/14/17 1023 08/14/17 1523         Objective:   Vitals:   08/15/17 2043 08/15/17 2345 08/16/17 0414 08/16/17 0752  BP: (!) 156/102 128/80 132/86 130/87  Pulse: 96 95 84 79  Resp: 18 18 18 19   Temp: 98.8 F (37.1 C) 98.7 F (37.1 C) 98.4 F (36.9 C) 98 F (36.7 C)  TempSrc: Oral Oral Oral Oral  SpO2: 98% 93% 96% 93%  Weight:      Height:        Wt Readings from Last 3 Encounters:  08/14/17 122.5 kg (270 lb)  09/18/16  124.3 kg (274 lb)  02/22/16 127.5 kg (281 lb 3 oz)     Intake/Output Summary (Last 24 hours) at 08/16/2017 0910 Last data filed at 08/16/2017 0626 Gross per 24 hour  Intake 2210.1 ml  Output 1601 ml  Net 609.1 ml     Physical Exam  Awake Alert, Oriented X 3, No new F.N deficits, Normal affect [place].AT,PERRAL Supple Neck,No JVD, No cervical lymphadenopathy appriciated.  Symmetrical Chest wall movement, Good air movement bilaterally, CTAB RRR,No Gallops, Rubs or new Murmurs, No Parasternal Heave +ve B.Sounds, Abd Soft, No tenderness, No organomegaly appriciated, No rebound - guarding or rigidity. No Cyanosis,  Clubbing or edema, No new Rash or bruise    Data Review:    CBC Recent Labs  Lab 08/14/17 0723 08/15/17 0904  WBC 15.4* 8.8  HGB 17.4* 14.7  HCT 50.3 44.6  PLT 234 195  MCV 81.7 81.7  MCH 28.2 26.9  MCHC 34.6 33.0  RDW 13.1 13.0  LYMPHSABS 1.1 1.2  MONOABS 1.1* 0.7  EOSABS 0.0 0.1  BASOSABS 0.0 0.0    Chemistries  Recent Labs  Lab 08/14/17 0723 08/15/17 0904  NA 139 140  K 4.0 3.2*  CL 111 110  CO2 20* 23  GLUCOSE 115* 122*  BUN 12 6  CREATININE 1.11 1.32*  CALCIUM 9.2 8.3*  MG 2.1 1.7  AST 35 40  ALT 47 37  ALKPHOS 92 70  BILITOT 0.7 0.9   ------------------------------------------------------------------------------------------------------------------ No results for input(s): CHOL, HDL, LDLCALC, TRIG, CHOLHDL, LDLDIRECT in the last 72 hours.  No results found for: HGBA1C ------------------------------------------------------------------------------------------------------------------ No results for input(s): TSH, T4TOTAL, T3FREE, THYROIDAB in the last 72 hours.  Invalid input(s): FREET3 ------------------------------------------------------------------------------------------------------------------ No results for input(s): VITAMINB12, FOLATE, FERRITIN, TIBC, IRON, RETICCTPCT in the last 72 hours.  Coagulation profile Recent Labs  Lab 08/15/17 0904  INR 1.15    No results for input(s): DDIMER in the last 72 hours.  Cardiac Enzymes No results for input(s): CKMB, TROPONINI, MYOGLOBIN in the last 168 hours.  Invalid input(s): CK ------------------------------------------------------------------------------------------------------------------ No results found for: BNP  Micro Results Recent Results (from the past 240 hour(s))  Blood culture (routine x 2)     Status: None (Preliminary result)   Collection Time: 08/14/17  8:36 AM  Result Value Ref Range Status   Specimen Description   Final    BLOOD RIGHT HAND Performed at [facility] [address] [place], [place] [address]    Special Requests   Final    BOTTLES DRAWN AEROBIC AND ANAEROBIC Blood Culture adequate volume Performed at [facility], [address] [place], [place] [address]    Culture   Final    NO GROWTH 1 DAY Performed at [facility], [address], [place], [place] [address]    Report Status PENDING  Incomplete  Blood culture (routine x 2)     Status: None (Preliminary result)   Collection Time: 08/14/17  8:36 AM  Result Value Ref Range Status   Specimen Description   Final    BLOOD RIGHT ANTECUBITAL Performed at [facility], [address] [place], [place] [address]    Special Requests   Final    BOTTLES DRAWN AEROBIC AND ANAEROBIC Blood Culture adequate volume Performed at [facility], [address], [place], [place] [address]    Culture   Final    NO GROWTH 1 DAY Performed at [facility], [address] [place],  [place] [address]    Report Status PENDING  Incomplete  CSF culture     Status: None (Preliminary result)   Collection Time: 08/14/17  9:54 AM  Result Value Ref Range Status   Specimen Description   Final    CSF Performed at [facility], [address] [place], [place] [address]    Special Requests   Final    NONE Performed at [facility], [address] [place], [place] [address]    Gram Stain   Final    WBC PRESENT,BOTH PMN AND MONONUCLEAR NO ORGANISMS SEEN CYTOSPIN SMEAR Gram Stain Report Called to,Read Back By and Verified With: VAUGHN,L @ 1145 ON [id number] BY POTEAT,S Performed at [facility], [address] [place], [place] [address]    Culture   Final    NO GROWTH < 24 HOURS Performed at [facility], [address] [place], [place] [address]    Report Status PENDING  Incomplete    Radiology Reports Dg Chest 2 View  Result Date: 08/14/2017 CLINICAL DATA:   Seizure EXAM: CHEST - 2 VIEW COMPARISON:  10/23/2015 FINDINGS: Chronic elevation of the right hemidiaphragm. Poor inspiration today. Chronic volume loss at the right lung base, but with accentuation due to the poor inspiration. Can't rule out active basilar atelectasis or pneumonia. Upper lungs are clear. IMPRESSION: Poor inspiration. Chronic elevation of the right hemidiaphragm. Chronic volume loss at the right base. Volume loss at both lung bases is accentuated due to the poor inspiration. Can't rule out basilar atelectasis or pneumonia. Electronically Signed   By: Paulina FusiMark  Shogry M.D.   On: 08/14/2017 09:29   Dg Shoulder Right  Result Date: 08/14/2017 CLINICAL DATA:  32 year old male status post seizures, severe right shoulder and arm pain. EXAM: RIGHT SHOULDER - 2+ VIEW COMPARISON:  12/24/2015 FINDINGS: Bone mineralization is within normal limits. There is no evidence of fracture or dislocation. There is no evidence of arthropathy or other focal bone abnormality. Soft tissues are unremarkable. IMPRESSION: Negative. Electronically Signed   By: Odessa FlemingH  Hall M.D.   On: 08/14/2017 07:46   Ct Head Wo Contrast  Result Date: 08/14/2017 CLINICAL DATA:  Patient was in bed and had seizures. Patient has known seizure disorder. Evaluate for intracranial abnormality or cervical spine ligamentous injury. EXAM: CT HEAD WITHOUT CONTRAST CT CERVICAL SPINE WITHOUT CONTRAST TECHNIQUE: Multidetector CT imaging of the head and cervical spine was performed following the standard protocol without intravenous contrast. Multiplanar CT image reconstructions of the cervical spine were also generated. COMPARISON:  CT head 12/24/2015. FINDINGS: The patient was unable to remain motionless for the exam. Small or subtle lesions could be overlooked. CT HEAD FINDINGS Brain: No evidence for acute infarction, hemorrhage, mass lesion, hydrocephalus, or extra-axial fluid. Normal for age cerebral volume. No definite white matter disease. Vascular: No  hyperdense vessel or unexpected calcification. Skull: Normal. Negative for fracture or focal lesion. Sinuses/Orbits: No acute finding. Other: Compared with prior head CT, no change. CT CERVICAL SPINE FINDINGS Significant motion degradation. Alignment: Straightening of the normal cervical lordosis. Skull base and vertebrae: No definite fracture on this motion degraded scan. Soft tissues and spinal canal: No definite prevertebral fluid. Cannot assess for canal hematoma. Disc levels:  Intervertebral disc spaces are preserved. Upper chest: Respiratory motion. No visible pneumothorax or large mass. Other: None. IMPRESSION: Unremarkable CT head without contrast. No skull fracture, intracranial hemorrhage, or visible mass lesion on this noncontrast exam. Motion degraded cervical spine CT demonstrating no  gross fracture or subluxation. Electronically Signed   By: Elsie Stain M.D.   On: 08/14/2017 08:21   Ct Cervical Spine Wo Contrast  Result Date: 08/14/2017 CLINICAL DATA:  Patient was in bed and had seizures. Patient has known seizure disorder. Evaluate for intracranial abnormality or cervical spine ligamentous injury. EXAM: CT HEAD WITHOUT CONTRAST CT CERVICAL SPINE WITHOUT CONTRAST TECHNIQUE: Multidetector CT imaging of the head and cervical spine was performed following the standard protocol without intravenous contrast. Multiplanar CT image reconstructions of the cervical spine were also generated. COMPARISON:  CT head 12/24/2015. FINDINGS: The patient was unable to remain motionless for the exam. Small or subtle lesions could be overlooked. CT HEAD FINDINGS Brain: No evidence for acute infarction, hemorrhage, mass lesion, hydrocephalus, or extra-axial fluid. Normal for age cerebral volume. No definite white matter disease. Vascular: No hyperdense vessel or unexpected calcification. Skull: Normal. Negative for fracture or focal lesion. Sinuses/Orbits: No acute finding. Other: Compared with prior head CT, no  change. CT CERVICAL SPINE FINDINGS Significant motion degradation. Alignment: Straightening of the normal cervical lordosis. Skull base and vertebrae: No definite fracture on this motion degraded scan. Soft tissues and spinal canal: No definite prevertebral fluid. Cannot assess for canal hematoma. Disc levels:  Intervertebral disc spaces are preserved. Upper chest: Respiratory motion. No visible pneumothorax or large mass. Other: None. IMPRESSION: Unremarkable CT head without contrast. No skull fracture, intracranial hemorrhage, or visible mass lesion on this noncontrast exam. Motion degraded cervical spine CT demonstrating no gross fracture or subluxation. Electronically Signed   By: Elsie Stain M.D.   On: 08/14/2017 08:21   Dg Humerus Right  Result Date: 08/14/2017 CLINICAL DATA:  Acute right arm pain after multiple seizures last night. EXAM: RIGHT HUMERUS - 2+ VIEW COMPARISON:  None. FINDINGS: There is no evidence of fracture or other focal bone lesions. Soft tissues are unremarkable. IMPRESSION: Normal right humerus. Electronically Signed   By: Lupita Raider, M.D.   On: 08/14/2017 07:50    Time Spent in minutes  30   Susa Raring M.D on 08/16/2017 at 9:10 AM  Between 7am to 7pm - Pager - [phone] ( page via [email or web address], text pages only, please mention full 10 digit call back number). After 7pm go to [email or web address] - password [email or web address]

## 2017-08-16 NOTE — Progress Notes (Addendum)
Pharmacy Antibiotic Note  Peter Barr is a 32 y.o. male admitted on 08/14/2017 with active seizures & empiric coverage of  meningitis due to report of neck pain, headache with dizziness, and photophobia.   Pharmacy has been consulted for Vancomycin dosing. Rocephin & Acyclovir per MD. LP has been attempted but unsuccessful - plan now to get LP under IR tomorrow.  WBC 15.4 initially, now within normal limits. LA 1.3. Afebrile now. Scr 1.29 (normCrCl 85 mL/min). Cx are NGTD so far.   Plan: Vancomycin 1250 IV every 8 hours.  Goal trough 15-20 mcg/mL.  Ceftriaxone 2 g IV every 12 hours Acyclovir 940 mg IV every 8 hours Monitor renal function, clinical pic, cx results, and will obtain VT tonight  Height: 5\' 11"  (180.3 cm) Weight: 270 lb (122.5 kg) IBW/kg (Calculated) : 75.3  Temp (24hrs), Avg:98.4 F (36.9 C), Min:97.8 F (36.6 C), Max:98.9 F (37.2 C)  Recent Labs  Lab 08/14/17 0723 08/14/17 0851 08/14/17 1047 08/15/17 0904 08/15/17 1217 08/16/17 0921  WBC 15.4*  --   --  8.8  --  8.3  CREATININE 1.11  --   --  1.32*  --  1.29*  LATICACIDVEN  --  8.04* 1.82 1.3 1.3  --     Estimated Creatinine Clearance: 111.6 mL/min (A) (by C-G formula based on SCr of 1.29 mg/dL (H)).    Allergies  Allergen Reactions  . Depakote Er [Divalproex Sodium Er] Other (See Comments)    Caused burns on arm  . Dilantin [Phenytoin Sodium Extended] Other (See Comments)    Caused burns on arm   . Adhesive [Tape] Itching and Rash    Heart monitor sticky pads (leads)  . Tapentadol Itching and Rash    Heart monitor sticky pads (leads)  . Tramadol Palpitations    Spasms, dizzy (also)    Antimicrobials this admission: 6/7 Rocephin >>  6/7 Vanc >>  6/7 Acyclovir>>  Dose adjustments this admission: N/A  Microbiology results: 6/7 BCx: NGTD 6/7 CSF cx: NGTD  Thank you for allowing pharmacy to be a part of this patient's care.  Girard CooterKimberly Perkins, PharmD Clinical Pharmacist  Pager:  [phone] Phone: [phone] 08/16/2017 1:11 PM   ADDENDUM On day #3 of vancomycin. Vancomycin trough ~6.5 hours after last dose was 18 on 1250 mg every 8 hours. Scr 1.29 (normCrCl 85 mL/min). Will continue at same dose. Plan for LP tomorrow and will follow duration of therapy pending results.  Girard CooterKimberly Perkins, PharmD Clinical Pharmacist  Pager: [phone] Phone: [phone]

## 2017-08-16 NOTE — Plan of Care (Signed)
Peter Barr complains of persistent headache, dizziness and intermittent nausea.  One episode of nausea with emesis this morning.  Dr. Thedore MinsSingh rounded with me--still planning to repeat LP tomorrow for r/o meningitis.  If LP is negative, plan for discharge tomorrow.  Nursing plan of care will be pain relief, up to chair and gradually increasing mobility.

## 2017-08-17 ENCOUNTER — Inpatient Hospital Stay (HOSPITAL_COMMUNITY): Payer: Medicaid Other

## 2017-08-17 LAB — BASIC METABOLIC PANEL
ANION GAP: 9 (ref 5–15)
CHLORIDE: 109 mmol/L (ref 101–111)
CO2: 24 mmol/L (ref 22–32)
Calcium: 9.2 mg/dL (ref 8.9–10.3)
Creatinine, Ser: 1.35 mg/dL — ABNORMAL HIGH (ref 0.61–1.24)
GFR calc Af Amer: >60 mL/min (ref >60)
GFR calc non Af Amer: >60 mL/min (ref >60)
GLUCOSE: 108 mg/dL — AB (ref 65–99)
POTASSIUM: 3.8 mmol/L (ref 3.5–5.1)
Sodium: 142 mmol/L (ref 135–145)

## 2017-08-17 LAB — CBC
HEMATOCRIT: 47.1 % (ref 39.0–52.0)
Hemoglobin: 15.3 g/dL (ref 13.0–17.0)
MCH: 26.7 pg (ref 26.0–34.0)
MCHC: 32.5 g/dL (ref 30.0–36.0)
MCV: 82.3 fL (ref 78.0–100.0)
PLATELETS: 224 10*3/uL (ref 150–400)
RBC: 5.72 MIL/uL (ref 4.22–5.81)
RDW: 12.9 % (ref 11.5–15.5)
WBC: 8.6 10*3/uL (ref 4.0–10.5)

## 2017-08-17 LAB — CSF CULTURE: Culture: NO GROWTH

## 2017-08-17 LAB — CSF CULTURE W GRAM STAIN

## 2017-08-17 LAB — CSF CELL COUNT WITH DIFFERENTIAL
LYMPHS CSF: 38 % — AB (ref 40–80)
MONOCYTE-MACROPHAGE-SPINAL FLUID: 9 % — AB (ref 15–45)
RBC Count, CSF: 21000 /mm3 — ABNORMAL HIGH
SEGMENTED NEUTROPHILS-CSF: 53 % — AB (ref 0–6)
Tube #: 1
WBC CSF: 14 /mm3 — AB (ref 0–5)

## 2017-08-17 LAB — LAMOTRIGINE LEVEL: LAMOTRIGINE LVL: NOT DETECTED ug/mL (ref 2.0–20.0)

## 2017-08-17 LAB — MAGNESIUM: MAGNESIUM: 1.9 mg/dL (ref 1.7–2.4)

## 2017-08-17 LAB — PROTEIN, CSF: TOTAL PROTEIN, CSF: 103 mg/dL — AB (ref 15–45)

## 2017-08-17 LAB — GLUCOSE, CSF: Glucose, CSF: 60 mg/dL (ref 40–70)

## 2017-08-17 MED ORDER — MORPHINE SULFATE (PF) 2 MG/ML IV SOLN
2.0000 mg | Freq: Once | INTRAVENOUS | Status: AC
  Administered 2017-08-17: 2 mg via INTRAVENOUS
  Filled 2017-08-17: qty 1

## 2017-08-17 MED ORDER — ACETAMINOPHEN 325 MG PO TABS
650.0000 mg | ORAL_TABLET | Freq: Four times a day (QID) | ORAL | Status: DC | PRN
  Administered 2017-08-17 – 2017-08-21 (×10): 650 mg via ORAL
  Filled 2017-08-17 (×11): qty 2

## 2017-08-17 MED ORDER — LIDOCAINE HCL (PF) 1 % IJ SOLN
INTRAMUSCULAR | Status: AC
  Filled 2017-08-17: qty 5

## 2017-08-17 MED ORDER — SODIUM CHLORIDE 0.9 % IV SOLN
INTRAVENOUS | Status: AC
  Administered 2017-08-17 – 2017-08-18 (×2): via INTRAVENOUS

## 2017-08-17 MED ORDER — IPRATROPIUM-ALBUTEROL 0.5-2.5 (3) MG/3ML IN SOLN
3.0000 mL | Freq: Four times a day (QID) | RESPIRATORY_TRACT | Status: DC
  Filled 2017-08-17: qty 3

## 2017-08-17 MED ORDER — LIDOCAINE HCL (PF) 1 % IJ SOLN
5.0000 mL | Freq: Once | INTRAMUSCULAR | Status: AC
  Administered 2017-08-17: 5 mL via INTRADERMAL

## 2017-08-17 NOTE — Procedures (Signed)
PROCEDURE: Informed consent was obtained from the patient prior to the procedure, including potential complications of headache, allergy, and pain. With the patient prone, the lower back was prepped with Betadine. 1% Lidocaine was used for local anesthesia. Lumbar puncture was performed at the [L4-L5] level using a [20] gauge needle with return of [blood tinged] CSF with an opening pressure of [12] cm water. [Four] ml of CSF were obtained for laboratory studies.   Limited CSF fluid was obtained.  The patient was uncomfortable with a feet down orientation (reverse Trendelenburg) which limited CSF flow.   The patient tolerated the procedure well and there were no apparent complications.  Successful lumbar puncture with limited fluid.

## 2017-08-17 NOTE — Progress Notes (Signed)
Subjective: No significant changes subjective.  Patient still slow to respond.  Patient still has a throbbing headache and neck pain with both lateral movement and flexion of his neck.  He complains of dizziness but extraocular movements are with intact and does not show any nystagmus.  No seizures overnight. Exam: Vitals:   08/17/17 0440 08/17/17 0837  BP: (!) 140/93 122/83  Pulse: 83 85  Resp: 18 18  Temp: 98.7 F (37.1 C) 98.4 F (36.9 C)  SpO2: 95% 98%    Physical Exam   HEENT-  Normocephalic, no lesions, without obvious abnormality.  Normal external eye and conjunctiva.   Extremities- Warm, dry and intact Musculoskeletal-no joint tenderness, deformity or swelling Skin-warm and dry, no hyperpigmentation, vitiligo, or suspicious lesions    Neuro:  Mental Status: Alert, oriented, thought process is slower than usual.  Speech fluent without evidence of aphasia.  Able to follow simple commands secondary to slow response and neck pain/head pain. Cranial Nerves: II: Visual fields grossly normal,  III,IV, VI: ptosis not present, extra-ocular motions intact bilaterally pupils equal, round, reactive to light and accommodation V,VII: smile symmetric, facial light touch sensation normal bilaterally VIII: hearing normal bilaterally IX,X: uvula rises symmetrically XI: bilateral shoulder shrug XII: midline tongue extension Motor:--Still has persistent neck pain with lateral rotation of his neck and flexion extension.  Denies any Lhermitte sign.  Does state that with any movement the pressure increases in his head causing his headache to worsen.  He also states that when he sits up his headache gets worse.  States his headache started 2 days ago, but is unsure this however this may represent LP headache however with him laying down and moving which exacerbates his headache would not correlate with this.  4-5 strength throughout--some of this is secondary to pain and some of this also I  believe is secondary to some decreased effort  Tone and bulk:normal tone throughout; no atrophy noted Sensory: Pinprick and light touch intact throughout, bilaterally Deep Tendon Reflexes: 2+ and symmetric throughout Plantars: Mute bilaterally Cerebellar: normal finger-to-nose,  Gait:     Medications:  Scheduled: . enoxaparin (LOVENOX) injection  40 mg Subcutaneous Once  . lamoTRIgine  200 mg Oral Daily  . lidocaine (PF)      . pantoprazole  40 mg Oral Daily  . zonisamide  200 mg Oral QHS    Pertinent Labs/Diagnostics: LP to be performed under interventional radiology today  No results found.     Assessment: 32 year old male [facility] secondary to having 2 seizures.  He does have a seizure history with left temporal lobe activity.  At time of admission he was currently on 200 mg Lamictal in the morning and 300 milligrams Lamictal at night this was [facility]  He initially was placed on Keppra but this was DC'd.  He has had no further breakthrough seizures.  Seizures most likely secondary to missing 2 days of his medication secondary to noncompliance.   Impression: - Possible right rotator cuff tear - Possible meningitis secondary to elevation of white blood count and neutrophil count with neck discomfort, photophobia    Recommendations: -Lumbar puncture via IR -Continue home dose Lamictal with zonisamide -MRI of shoulder to look for possible right rotator cuff tear -Per [facility] [place] DM[place] statutes, patients with seizures are not allowed to drive until  they have been seizure-free for six months. Use caution when using heavy equipment or power tools. Avoid working on ladders or  at heights. Take showers instead of baths. Ensure the water temperature is not too high on the home water heater. Do not go swimming alone. When caring for infants or small children, sit down when holding, feeding, or changing them to minimize risk  of injury to the child in the event you have a seizure.   Also, Maintain good sleep hygiene. Avoid alcohol.   Felicie Morn PA-C [facility] [phone] 08/17/2017, 9:46 AM

## 2017-08-17 NOTE — Progress Notes (Signed)
CRITICAL VALUE ALERT  Critical Value:  WBC 14 in CSF  Date & Time Notied:  08/17/17  1451  Provider Notified: Dr Thedore MinsSingh  Orders Received/Actions taken: Waiting for response

## 2017-08-17 NOTE — Plan of Care (Signed)
  Problem: Nutrition: Goal: Adequate nutrition will be maintained 08/17/2017 1923 by William Daltonarpenter, Louvenia Golomb L, RN Outcome: Progressing 08/17/2017 1922 by William Daltonarpenter, Miosotis Wetsel L, RN Outcome: Progressing   Problem: Elimination: Goal: Will not experience complications related to bowel motility 08/17/2017 1923 by William Daltonarpenter, Laiklyn Pilkenton L, RN Outcome: Progressing 08/17/2017 1922 by William Daltonarpenter, Danija Gosa L, RN Outcome: Progressing   Problem: Safety: Goal: Ability to remain free from injury will improve 08/17/2017 1923 by William Daltonarpenter, Jamiria Langill L, RN Outcome: Progressing 08/17/2017 1922 by William Daltonarpenter, Maisey Deandrade L, RN Outcome: Progressing

## 2017-08-17 NOTE — Plan of Care (Signed)
  Problem: Elimination: Goal: Will not experience complications related to bowel motility Outcome: Progressing   Problem: Safety: Goal: Ability to remain free from injury will improve Outcome: Progressing   Problem: Pain Managment: Goal: General experience of comfort will improve Outcome: Not Progressing   

## 2017-08-17 NOTE — Progress Notes (Signed)
08/17/17  1236  Patient c/o SOB after LP. Patient is laying flat and encouraged to stay flat until 4pm. Dr Thedore MinsSingh notified. VS stable

## 2017-08-17 NOTE — Care Management Note (Signed)
Case Management Note  Patient Details  Name: Peter Barr MRN: [id number] Date of Birth: 1985-04-05  Subjective/Objective:    Pt admitted with seizures. He is from home with his spouse.                 Action/Plan: Plan is for patient to return home when medically ready. CM following for d/c needs, physician orders.    Expected Discharge Date:  (unknown)               Expected Discharge Plan:  Home/Self Care  In-House Referral:     Discharge planning Services     Post Acute Care Choice:    Choice offered to:     DME Arranged:    DME Agency:     HH Arranged:    HH Agency:     Status of Service:  In process, will continue to follow  If discussed at Long Length of Stay Meetings, dates discussed:    Additional Comments:  [name], RN 08/17/2017, 11:26 AM

## 2017-08-17 NOTE — Progress Notes (Addendum)
@IPLOG @        PROGRESS NOTE                                                                                                                                                                                                             Patient Demographics:    Peter Barr, is a 32 y.o. male, DOB - 04-17-1985, ZOX:[id number]  Admit date - 08/14/2017   Admitting Physician Burnadette Pop, MD  Outpatient Primary MD for the patient is [name], L.[name], MD  LOS - 3  Chief Complaint  Patient presents with  . Seizures       Brief Narrative 32 year old African-American male with history of seizure disorder who came to the ER after 2 episodes of generalized tonic-clonic seizures at home, in the ER was found to be febrile, he was out of his medications for 2 days.  In the ER he was seen by neuro, LP was done unfortunately insufficient CSF was obtained.  He was empirically started on treatment for meningitis and a repeat LP was ordered, unfortunately repeat LP was canceled by IR by mistake.  It has been reordered.  He is here for further management of his seizures.   Subjective:   Patient in bed, appears comfortable, mild generalized headache, no fever, no chest pain or pressure, no shortness of breath , no abdominal pain. No focal weakness.  Continues to have intermittent right shoulder pain with limited range of motion along with chronic low back pain.    Assessment  & Plan :     1.  Breakthrough seizure.  Likely due to noncompliance with his home medications which he forgot to refill.  Neuro following, currently on home dose Lamictal along with Zonegran ordered by neurology, head CT negative, initial LP did not obtain sufficient CSF and a repeat LP has been ordered through IR on 08/14/2017 unfortunately that order was canceled by mistake by IR, LP has been again reordered and IR unable to do it till 08/17/2017, clinically my suspicion for meningitis is extremely low.  Currently on empiric  acyclovir, Rocephin and Vancomycin combination per neurology.  Case was discussed with neurologist Dr. Otelia Limes on 08/15/2017, agrees with the plan and present management which will be continued unchanged.  2.  Suspected and meningitis/encephalitis.  Currently see #1 above.  3.  Mild ARF.  Got a dose of Toradol, also on vancomycin.  Will hydrate and monitor.  Discontinue any further NSAID use.  Monitor postvoid bladder scan.  4. Ongoing right shoulder pain since his fall.  X-rays unremarkable.  Possibility of soft tissue or rotator cuff injury.  Check MRI.    Diet :  Diet Order           Diet NPO time specified Except for: Sips with Meds  Diet effective now           Family Communication  :  None  Code Status :  Full  Disposition Plan  :  Home once better  Consults  :  Neuro, IR  Procedures  :    LP x 1 - insufficient CSF obtained  Repeat LP ordered, canceled by IR in error on 08/14/2017, reordered again on 08/15/2017    DVT Prophylaxis  :  Lovenox/ SCDs    Lab Results  Component Value Date   PLT 224 08/17/2017    Inpatient Medications  Scheduled Meds: . enoxaparin (LOVENOX) injection  40 mg Subcutaneous Once  . lamoTRIgine  200 mg Oral Daily  . pantoprazole  40 mg Oral Daily  . zonisamide  200 mg Oral QHS   Continuous Infusions: . acyclovir Stopped (08/17/17 0535)  . cefTRIAXone (ROCEPHIN)  IV Stopped (08/16/17 2235)  . vancomycin Stopped (08/17/17 0601)   PRN Meds:.ibuprofen, LORazepam, magic mouthwash w/lidocaine, ondansetron (ZOFRAN) IV  Antibiotics  :    Anti-infectives (From admission, onward)   Start     Dose/Rate Route Frequency Ordered Stop   08/14/17 2200  acyclovir (ZOVIRAX) 615 mg in dextrose 5 % 100 mL IVPB  Status:  Discontinued     5 mg/kg  122.5 kg 112.3 mL/hr over 60 Minutes Intravenous Every 12 hours 08/14/17 1112 08/14/17 1135   08/14/17 2200  cefTRIAXone (ROCEPHIN) 2 g in sodium chloride 0.9 % 100 mL IVPB     2 g 200 mL/hr over 30 Minutes  Intravenous Every 12 hours 08/14/17 1135     08/14/17 2000  acyclovir (ZOVIRAX) 940 mg in dextrose 5 % 150 mL IVPB     10 mg/kg  94.2 kg (Adjusted) 168.8 mL/hr over 60 Minutes Intravenous Every 8 hours 08/14/17 1135     08/14/17 2000  vancomycin (VANCOCIN) 1,250 mg in sodium chloride 0.9 % 250 mL IVPB     1,250 mg 166.7 mL/hr over 90 Minutes Intravenous Every 8 hours 08/14/17 1241     08/14/17 1230  vancomycin (VANCOCIN) 1,500 mg in sodium chloride 0.9 % 500 mL IVPB     1,500 mg 250 mL/hr over 120 Minutes Intravenous  Once 08/14/17 1136 08/14/17 1439   08/14/17 1115  cefTRIAXone (ROCEPHIN) 2 g in sodium chloride 0.9 % 100 mL IVPB  Status:  Discontinued     2 g 200 mL/hr over 30 Minutes Intravenous Every 24 hours 08/14/17 1112 08/14/17 1135   08/14/17 1030  vancomycin (VANCOCIN) IVPB 1000 mg/200 mL premix     1,000 mg 200 mL/hr over 60 Minutes Intravenous  Once 08/14/17 1023 08/14/17 1158   08/14/17 1030  cefTRIAXone (ROCEPHIN) 2 g in sodium chloride 0.9 % 100 mL IVPB     2 g 200 mL/hr over 30 Minutes Intravenous  Once 08/14/17 1023 08/14/17 1127   08/14/17 1030  acyclovir (ZOVIRAX) 940 mg in dextrose 5 % 150 mL IVPB     10 mg/kg  94.2 kg (Adjusted) 168.8 mL/hr over 60 Minutes Intravenous  Once 08/14/17 1023 08/14/17 1523         Objective:   Vitals:   08/16/17 1634 08/16/17 2035 08/17/17 0009 08/17/17 0440  BP: 131/90 (!) 140/97 126/84 (!) 140/93  Pulse: 84  85 86 83  Resp: 20 18 16 18   Temp: 98 F (36.7 C) 98.7 F (37.1 C) 99.1 F (37.3 C) 98.7 F (37.1 C)  TempSrc: Oral Oral Oral Oral  SpO2: 95% 94% 95% 95%  Weight:      Height:        Wt Readings from Last 3 Encounters:  08/14/17 122.5 kg (270 lb)  09/18/16 124.3 kg (274 lb)  02/22/16 127.5 kg (281 lb 3 oz)     Intake/Output Summary (Last 24 hours) at 08/17/2017 0833 Last data filed at 08/17/2017 0442 Gross per 24 hour  Intake 410 ml  Output 2110 ml  Net -1700 ml     Physical Exam  Awake Alert, Oriented  X 3, No new F.N deficits, Normal affect [place].AT,PERRAL Supple Neck,No JVD, No cervical lymphadenopathy appriciated.  Symmetrical Chest wall movement, Good air movement bilaterally, CTAB RRR,No Gallops, Rubs or new Murmurs, No Parasternal Heave +ve B.Sounds, Abd Soft, No tenderness, No organomegaly appriciated, No rebound - guarding or rigidity. No Cyanosis, Clubbing or edema, No new Rash or bruise, mild - moderate right shoulder pain with limited range of motion due to pain     Data Review:    CBC Recent Labs  Lab 08/14/17 0723 08/15/17 0904 08/16/17 0921 08/17/17 0449  WBC 15.4* 8.8 8.3 8.6  HGB 17.4* 14.7 15.3 15.3  HCT 50.3 44.6 46.9 47.1  PLT 234 195 223 224  MCV 81.7 81.7 81.8 82.3  MCH 28.2 26.9 26.7 26.7  MCHC 34.6 33.0 32.6 32.5  RDW 13.1 13.0 13.0 12.9  LYMPHSABS 1.1 1.2  --   --   MONOABS 1.1* 0.7  --   --   EOSABS 0.0 0.1  --   --   BASOSABS 0.0 0.0  --   --     Chemistries  Recent Labs  Lab 08/14/17 0723 08/15/17 0904 08/16/17 0921 08/17/17 0449  NA 139 140 138 142  K 4.0 3.2* 3.4* 3.8  CL 111 110 108 109  CO2 20* 23 24 24   GLUCOSE 115* 122* 131* 108*  BUN 12 6 <5* <5*  CREATININE 1.11 1.32* 1.29* 1.35*  CALCIUM 9.2 8.3* 8.4* 9.2  MG 2.1 1.7 1.9 1.9  AST 35 40  --   --   ALT 47 37  --   --   ALKPHOS 92 70  --   --   BILITOT 0.7 0.9  --   --    ------------------------------------------------------------------------------------------------------------------ No results for input(s): CHOL, HDL, LDLCALC, TRIG, CHOLHDL, LDLDIRECT in the last 72 hours.  No results found for: HGBA1C ------------------------------------------------------------------------------------------------------------------ No results for input(s): TSH, T4TOTAL, T3FREE, THYROIDAB in the last 72 hours.  Invalid input(s): FREET3 ------------------------------------------------------------------------------------------------------------------ No results for input(s): VITAMINB12,  FOLATE, FERRITIN, TIBC, IRON, RETICCTPCT in the last 72 hours.  Coagulation profile Recent Labs  Lab 08/15/17 0904  INR 1.15    No results for input(s): DDIMER in the last 72 hours.  Cardiac Enzymes No results for input(s): CKMB, TROPONINI, MYOGLOBIN in the last 168 hours.  Invalid input(s): CK ------------------------------------------------------------------------------------------------------------------ No results found for: BNP  Micro Results Recent Results (from the past 240 hour(s))  Blood culture (routine x 2)     Status: None (Preliminary result)   Collection Time: 08/14/17  8:36 AM  Result Value Ref Range Status   Specimen Description   Final    BLOOD RIGHT HAND Performed at [facility], [address] [place], [place] [address]    Special Requests  Final    BOTTLES DRAWN AEROBIC AND ANAEROBIC Blood Culture adequate volume Performed at [facility], [address], [place], [place] [address]    Culture   Final    NO GROWTH 2 DAYS Performed at [facility], [address], [place], [place] [address]    Report Status PENDING  Incomplete  Blood culture (routine x 2)     Status: None (Preliminary result)   Collection Time: 08/14/17  8:36 AM  Result Value Ref Range Status   Specimen Description   Final    BLOOD RIGHT ANTECUBITAL Performed at [facility], [address] [place], [place] [address]    Special Requests   Final    BOTTLES DRAWN AEROBIC AND ANAEROBIC Blood Culture adequate volume Performed at [facility], [address], [place], [place] [address]    Culture   Final    NO GROWTH 2 DAYS Performed at [facility], [address], [place], [place] [address]    Report Status PENDING  Incomplete  CSF culture     Status: None (Preliminary result)   Collection Time: 08/14/17  9:54 AM  Result Value Ref Range Status   Specimen Description   Final     CSF Performed at [facility], [address] [place], [place] [address]    Special Requests   Final    NONE Performed at [facility], [address], [place], [place] [address]    Gram Stain   Final    WBC PRESENT,BOTH PMN AND MONONUCLEAR NO ORGANISMS SEEN CYTOSPIN SMEAR Gram Stain Report Called to,Read Back By and Verified With: VAUGHN,L @ 1145 ON [id number] BY POTEAT,S Performed at [facility], [address], [place], [place] [address]    Culture   Final    NO GROWTH 2 DAYS Performed at [facility], [address] [place], [place] [address]    Report Status PENDING  Incomplete    Radiology Reports Dg Chest 2 View  Result Date: 08/14/2017 CLINICAL DATA:  Seizure EXAM: CHEST - 2 VIEW COMPARISON:  10/23/2015 FINDINGS: Chronic elevation of the right hemidiaphragm. Poor inspiration today. Chronic volume loss at the right lung base, but with accentuation due to the poor inspiration. Can't rule out active basilar atelectasis or pneumonia. Upper lungs are clear. IMPRESSION: Poor inspiration. Chronic elevation of the right hemidiaphragm. Chronic volume loss at the right base. Volume loss at both lung bases is accentuated due to the poor inspiration. Can't rule out basilar atelectasis or pneumonia. Electronically Signed   By: Paulina Fusi M.D.   On: 08/14/2017 09:29   Dg Shoulder Right  Result Date: 08/14/2017 CLINICAL DATA:  32 year old male status post seizures, severe right shoulder and arm pain. EXAM: RIGHT SHOULDER - 2+ VIEW COMPARISON:  12/24/2015 FINDINGS: Bone mineralization is within normal limits. There is no evidence of fracture or dislocation. There is no evidence of arthropathy or other focal bone abnormality. Soft tissues are unremarkable. IMPRESSION: Negative. Electronically Signed   By: Odessa Fleming M.D.   On: 08/14/2017 07:46   Ct Head Wo Contrast  Result Date: 08/14/2017 CLINICAL DATA:  Patient was in bed and had  seizures. Patient has known seizure disorder. Evaluate for intracranial abnormality or cervical spine ligamentous injury. EXAM: CT HEAD WITHOUT CONTRAST CT CERVICAL SPINE WITHOUT CONTRAST TECHNIQUE: Multidetector CT imaging of the head and cervical spine was performed following the standard protocol without intravenous contrast. Multiplanar  CT image reconstructions of the cervical spine were also generated. COMPARISON:  CT head 12/24/2015. FINDINGS: The patient was unable to remain motionless for the exam. Small or subtle lesions could be overlooked. CT HEAD FINDINGS Brain: No evidence for acute infarction, hemorrhage, mass lesion, hydrocephalus, or extra-axial fluid. Normal for age cerebral volume. No definite white matter disease. Vascular: No hyperdense vessel or unexpected calcification. Skull: Normal. Negative for fracture or focal lesion. Sinuses/Orbits: No acute finding. Other: Compared with prior head CT, no change. CT CERVICAL SPINE FINDINGS Significant motion degradation. Alignment: Straightening of the normal cervical lordosis. Skull base and vertebrae: No definite fracture on this motion degraded scan. Soft tissues and spinal canal: No definite prevertebral fluid. Cannot assess for canal hematoma. Disc levels:  Intervertebral disc spaces are preserved. Upper chest: Respiratory motion. No visible pneumothorax or large mass. Other: None. IMPRESSION: Unremarkable CT head without contrast. No skull fracture, intracranial hemorrhage, or visible mass lesion on this noncontrast exam. Motion degraded cervical spine CT demonstrating no gross fracture or subluxation. Electronically Signed   By: Elsie Stain M.D.   On: 08/14/2017 08:21   Ct Cervical Spine Wo Contrast  Result Date: 08/14/2017 CLINICAL DATA:  Patient was in bed and had seizures. Patient has known seizure disorder. Evaluate for intracranial abnormality or cervical spine ligamentous injury. EXAM: CT HEAD WITHOUT CONTRAST CT CERVICAL SPINE WITHOUT  CONTRAST TECHNIQUE: Multidetector CT imaging of the head and cervical spine was performed following the standard protocol without intravenous contrast. Multiplanar CT image reconstructions of the cervical spine were also generated. COMPARISON:  CT head 12/24/2015. FINDINGS: The patient was unable to remain motionless for the exam. Small or subtle lesions could be overlooked. CT HEAD FINDINGS Brain: No evidence for acute infarction, hemorrhage, mass lesion, hydrocephalus, or extra-axial fluid. Normal for age cerebral volume. No definite white matter disease. Vascular: No hyperdense vessel or unexpected calcification. Skull: Normal. Negative for fracture or focal lesion. Sinuses/Orbits: No acute finding. Other: Compared with prior head CT, no change. CT CERVICAL SPINE FINDINGS Significant motion degradation. Alignment: Straightening of the normal cervical lordosis. Skull base and vertebrae: No definite fracture on this motion degraded scan. Soft tissues and spinal canal: No definite prevertebral fluid. Cannot assess for canal hematoma. Disc levels:  Intervertebral disc spaces are preserved. Upper chest: Respiratory motion. No visible pneumothorax or large mass. Other: None. IMPRESSION: Unremarkable CT head without contrast. No skull fracture, intracranial hemorrhage, or visible mass lesion on this noncontrast exam. Motion degraded cervical spine CT demonstrating no gross fracture or subluxation. Electronically Signed   By: Elsie Stain M.D.   On: 08/14/2017 08:21   Dg Humerus Right  Result Date: 08/14/2017 CLINICAL DATA:  Acute right arm pain after multiple seizures last night. EXAM: RIGHT HUMERUS - 2+ VIEW COMPARISON:  None. FINDINGS: There is no evidence of fracture or other focal bone lesions. Soft tissues are unremarkable. IMPRESSION: Normal right humerus. Electronically Signed   By: Lupita Raider, M.D.   On: 08/14/2017 07:50    Time Spent in minutes  30   Susa Raring M.D on 08/17/2017 at 8:33  AM  Between 7am to 7pm - Pager - [phone] ( page via [email or web address], text pages only, please mention full 10 digit call back number). After 7pm go to [email or web address] - password [facility]

## 2017-08-17 NOTE — Progress Notes (Signed)
08/17/17  1112 A person in IR called stating patient needs red top drawn. I asked her if the MD had ordered the test  And she said yes. I called lab to have red top drawn. [phone].

## 2017-08-18 ENCOUNTER — Inpatient Hospital Stay (HOSPITAL_COMMUNITY): Payer: Medicaid Other

## 2017-08-18 LAB — BASIC METABOLIC PANEL
Anion gap: 8 (ref 5–15)
BUN: 7 mg/dL (ref 6–20)
CALCIUM: 9 mg/dL (ref 8.9–10.3)
CHLORIDE: 108 mmol/L (ref 101–111)
CO2: 24 mmol/L (ref 22–32)
CREATININE: 1.43 mg/dL — AB (ref 0.61–1.24)
GFR calc non Af Amer: >60 mL/min (ref >60)
GLUCOSE: 106 mg/dL — AB (ref 65–99)
Potassium: 3.6 mmol/L (ref 3.5–5.1)
Sodium: 140 mmol/L (ref 135–145)

## 2017-08-18 LAB — CBC
HEMATOCRIT: 46.1 % (ref 39.0–52.0)
HEMOGLOBIN: 15 g/dL (ref 13.0–17.0)
MCH: 26.8 pg (ref 26.0–34.0)
MCHC: 32.5 g/dL (ref 30.0–36.0)
MCV: 82.3 fL (ref 78.0–100.0)
Platelets: 216 10*3/uL (ref 150–400)
RBC: 5.6 MIL/uL (ref 4.22–5.81)
RDW: 13 % (ref 11.5–15.5)
WBC: 8.1 10*3/uL (ref 4.0–10.5)

## 2017-08-18 LAB — HERPES SIMPLEX VIRUS(HSV) DNA BY PCR
HSV 1 DNA: NEGATIVE
HSV 2 DNA: NEGATIVE

## 2017-08-18 MED ORDER — ZOLPIDEM TARTRATE 5 MG PO TABS
10.0000 mg | ORAL_TABLET | Freq: Once | ORAL | Status: AC
  Administered 2017-08-19: 10 mg via ORAL
  Filled 2017-08-18 (×2): qty 2

## 2017-08-18 MED ORDER — ACETAMINOPHEN 500 MG PO TABS
500.0000 mg | ORAL_TABLET | Freq: Once | ORAL | Status: AC
  Administered 2017-08-18: 500 mg via ORAL
  Filled 2017-08-18: qty 1

## 2017-08-18 MED ORDER — DIPHENHYDRAMINE HCL 50 MG/ML IJ SOLN
25.0000 mg | Freq: Once | INTRAMUSCULAR | Status: AC
  Administered 2017-08-18: 25 mg via INTRAVENOUS
  Filled 2017-08-18: qty 1

## 2017-08-18 MED ORDER — KETOROLAC TROMETHAMINE 30 MG/ML IJ SOLN
30.0000 mg | Freq: Once | INTRAMUSCULAR | Status: AC
  Administered 2017-08-18: 30 mg via INTRAVENOUS
  Filled 2017-08-18: qty 1

## 2017-08-18 MED ORDER — IPRATROPIUM-ALBUTEROL 0.5-2.5 (3) MG/3ML IN SOLN: 3.0000 mL | Freq: Four times a day (QID) | RESPIRATORY_TRACT | Status: DC | PRN

## 2017-08-18 MED ORDER — METOCLOPRAMIDE HCL 5 MG/ML IJ SOLN
10.0000 mg | Freq: Once | INTRAMUSCULAR | Status: AC
  Administered 2017-08-18: 10 mg via INTRAVENOUS
  Filled 2017-08-18: qty 2

## 2017-08-18 MED ORDER — DIPHENHYDRAMINE HCL 50 MG/ML IJ SOLN
INTRAMUSCULAR | Status: AC
  Filled 2017-08-18: qty 1

## 2017-08-18 NOTE — Progress Notes (Addendum)
Subjective: Patient is currently sitting in the chair, shows no discomfort.  When asked about his headache he complains of a posterior headache.  When asked about the severity of it with 0 being no pain and 10 being he is limping cut off with no anesthetic he complains of it being a 9 however he is sitting very comfortably.  Patient is alert and oriented and complaining of shoulder pain.  Still complaining of mild neck discomfort Exam: Vitals:   08/18/17 0429 08/18/17 0729  BP: (!) 129/91 131/85  Pulse: 79 79  Resp: 18 16  Temp: 97.9 F (36.6 C) 98.1 F (36.7 C)  SpO2: 92% 93%  Physical Exam  HEENT-  Normocephalic, no lesions, without obvious abnormality.  Normal external eye and conjunctiva.   Extremities- Warm, dry and intact Musculoskeletal-positive for right shoulder pain Skin-warm and dry, no hyperpigmentation, vitiligo, or suspicious lesions Neuro:  Mental Status: Alert, oriented, thought content appropriate.  Speech fluent without evidence of aphasia speech is bradycardiphasia.  Able to follow 3 step commands without difficulty. Cranial Nerves: II: Visual fields grossly normal,  III,IV, VI: ptosis not present, extra-ocular motions intact bilaterally pupils equal, round, reactive to light and accommodation V,VII: smile symmetric, facial light touch sensation normal bilaterally VIII: hearing normal bilaterally IX,X: uvula rises symmetrically XI: bilateral shoulder shrug XII: midline tongue extension Motor: Right : Upper extremity   see note below  Left:     Upper extremity   5/5  Lower extremity   5/5     Lower extremity   5/5 Right upper extremity is 3/5 however attempting to do internal and external rotation along with strength of forward flexion he gives very minimal effort thus very difficult to assess if he actually does have a rotator cuff tear Tone and bulk:normal tone throughout; no atrophy noted Sensory: Pinprick and light touch intact throughout, bilaterally Deep  Tendon Reflexes: 2+ and symmetric throughout Plantars: Mute bilaterally  Medications:  Scheduled: . enoxaparin (LOVENOX) injection  40 mg Subcutaneous Once  . lamoTRIgine  200 mg Oral Daily  . pantoprazole  40 mg Oral Daily  . zonisamide  200 mg Oral QHS    Pertinent Labs/Diagnostics:  Results for KENTAVIOUS, MICHELE (MRN [id number]) as of 08/18/2017 09:46  Ref. Range 08/17/2017 10:54  Appearance, CSF Latest Ref Range: CLEAR  CLOUDY (A)  Glucose, CSF Latest Ref Range: 40 - 70 mg/dL 60  RBC Count, CSF Latest Ref Range: 0 /cu mm 21,000 (H)  Segmented Neutrophils-CSF Latest Ref Range: 0 - 6 % 53 (H)  Lymphs, CSF Latest Ref Range: 40 - 80 % 38 (L)  Monocyte-Macrophage-Spinal Fluid Latest Ref Range: 15 - 45 % 9 (L)  Color, CSF Latest Ref Range: COLORLESS  RED (A)  Supernatant Unknown COLORLESS  Total  Protein, CSF Latest Ref Range: 15 - 45 mg/dL 478 (H)  Tube # Unknown 1  WBC, CSF Latest Ref Range: 0 - 5 /cu mm 14 (HH)   Corrected protein is approx 70 in the CSF HSV pending   US Renal  Result Date: 08/18/2017 CLINICAL DATA:  Acute renal failure. EXAM: RENAL / URINARY TRACT ULTRASOUND COMPLETE COMPARISON:  None. FINDINGS: Right Kidney: Length: 12.5 cm. Echogenicity within normal limits. No mass or hydronephrosis visualized. Left Kidney: Length: 12.8 cm. Echogenicity within normal limits. No mass or hydronephrosis visualized. Bladder: Appears normal for degree of bladder distention. IMPRESSION: 1. Normal renal sonogram. Electronically Signed   By: Signa Kell M.D.   On: 08/18/2017 09:23   Dg Chest  Port 1 View  Result Date: 08/17/2017 CLINICAL DATA:  Shortness of breath. EXAM: PORTABLE CHEST 1 VIEW COMPARISON:  08/14/2017 FINDINGS: As seen previously, there is elevation of the right hemidiaphragm. There is pulmonary venous hypertension without frank edema. Chronic volume loss at the right lung base appears similar to the previous studies. No sign of effusion. IMPRESSION: Chronic elevation  of the right hemidiaphragm with chronic volume loss at the right lung base. Pulmonary venous hypertension without frank edema. Electronically Signed   By: Paulina Fusi M.D.   On: 08/17/2017 13:54   Dg Fluoro Guide Lumbar Puncture  Result Date: 08/17/2017 CLINICAL DATA:  Headaches, concern for meningitis. Seizure disorder. EXAM: DIAGNOSTIC LUMBAR PUNCTURE UNDER FLUOROSCOPIC GUIDANCE FLUOROSCOPY TIME:  Radiation Exposure Index (as provided by the fluoroscopic device): 51.4 If the device does not provide the exposure index: Fluoroscopy Time (in minutes and seconds):  1 minutes 30 seconds Number of Acquired Images:  1 PROCEDURE: Informed consent was obtained from the patient prior to the procedure, including potential complications of headache, allergy, and pain. With the patient prone, the lower back was prepped with Betadine. 1% Lidocaine was used for local anesthesia. Lumbar puncture was performed at the L4-L5 level using a 20 gauge needle with return of blood tinged CSF with an opening pressure of 12 cm water. Four ml of CSF were obtained for laboratory studies. Limited CSF fluid was obtained. The patient was uncomfortable with a feet down orientation (reverse Trendelenburg) which limited CSF flow. The patient tolerated the procedure well and there were no apparent complications. IMPRESSION: Successful lumbar puncture with limited fluid. Electronically Signed   By: Genevive Bi M.D.   On: 08/17/2017 11:52   Dg Fluoro Guide Lumbar Puncture  Result Date: 08/17/2017 Lorna Few, MD     08/17/2017 11:51 AM PROCEDURE: Informed consent was obtained from the patient prior to the procedure, including potential complications of headache, allergy, and pain. With the patient prone, the lower back was prepped with Betadine. 1% Lidocaine was used for local anesthesia. Lumbar puncture was performed at the [L4-L5] level using a [20] gauge needle with return of [blood tinged] CSF with an opening pressure of [12] cm  water. [Four] ml of CSF were obtained for laboratory studies.  Limited CSF fluid was obtained.  The patient was uncomfortable with a feet down orientation (reverse Trendelenburg) which limited CSF flow.  The patient tolerated the procedure well and there were no apparent complications. Successful lumbar puncture with limited fluid.   Felicie Morn PA-C [facility] [phone]  Assessment: 32 year old male presenting to [facility] hospital secondary to having 2 seizures.    As stated prior --he does have a seizure history with left temporal lobe activity.  At time of admission he was currently on 200 mg Lamictal in the morning and 300 milligrams Lamictal at night this was restarted while he was at [facility].  He initially was placed on Keppra but this was DC'd.  He has had no further breakthrough seizures.  Seizures most likely secondary to missing 2 days of his medication secondary to noncompliance.  Still awaiting labs for HSV encephalitis-protein elevated in CSF but clinically improving and HSV PCR pending.  Impression: -Breakthrough seizure -Rule out HSV encephalitis-awaiting HSV PCR [High protein (even corrected for RBC) should be evaluated by HSV PCR. If negative, possibly seizures could explain elevated protein]  Recommendations: -Continue home dose Lamictal with zonisamide -Acyclovir till HSV PCR negative. Can d/c bacterial coverage  -Shoulder pain management per primary team.  -  [place] [facility]  [place], patients with seizures are not allowed to drive until  they have been seizure-free for six months. Use caution when using heavy equipment or power tools. Avoid working on ladders or at heights. Take showers instead of baths. Ensure the water temperature is not too high on the home water heater. Do not go swimming alone. When caring for infants or small children, sit down when holding, feeding, or changing them to minimize risk of injury to the child in the event  you have a seizure.  Also, Maintain good sleep hygiene. Avoid alcohol.  08/18/2017, 9:42 AM  Attending Neurohospitalist Addendum Patient seen and examined with APP/Resident. Agree with the history and physical as documented above. Agree with the plan as documented, which I helped formulate. I have independently reviewed the chart, obtained history, review of systems and examined the patient.I have personally reviewed pertinent head/neck/spine imaging (CT/MRI). Please feel free to call with any questions. --- Milon DikesAshish Gisela Lea, MD [facility] Pager: [phone] If 7pm to 7am, please call on call as listed on AMION.

## 2017-08-18 NOTE — Evaluation (Signed)
Physical Therapy Evaluation Patient Details Name: Peter Barr MRN: [id number] DOB: 09/10/1985 Today's Date: 08/18/2017   History of Present Illness  32 year old African-American male with history of seizure disorder who came to the ER after 2 episodes of generalized tonic-clonic seizures at home  Clinical Impression  Orders received for PT evaluation. Patient demonstrates deficits in functional mobility as indicated below. Will benefit from continued skilled PT to address deficits and maximize function. Will see as indicated and progress as tolerated.  OF NOTE: patient with poor effort and several inconsistencies throughout session.  Attempted strength assessment on LEs, to which patient with barely a contraction, but yet when moving to EOB and standing patient was able to move legs and maintain standing position with full weight bearing. Patient also complained of increased pain but reports are inconsistent during session, first complaining of pain in RLE then when walking complained of LLE pain but when return to bed reported "still hurting" in RLE.      Follow Up Recommendations No PT follow up    Equipment Recommendations  None recommended by PT    Recommendations for Other Services       Precautions / Restrictions Precautions Precautions: Fall      Mobility  Bed Mobility Overal bed mobility: Needs Assistance Bed Mobility: Supine to Sit;Sit to Supine     Supine to sit: Supervision Sit to supine: Supervision   General bed mobility comments: Significant amount of time to perform, poor effort noted  Transfers Overall transfer level: Needs assistance Equipment used: 1 person hand held assist Transfers: Sit to/from Stand Sit to Stand: Min assist         General transfer comment: HHA provided   Ambulation/Gait Ambulation/Gait assistance: Min guard Ambulation Distance (Feet): 18 Feet Assistive device: IV Pole Gait Pattern/deviations: Step-to pattern;Decreased  stride length;Wide base of support     General Gait Details: extremely slow gait, no physical assist required. Reports limited by pain   Stairs            Wheelchair Mobility    Modified Rankin (Stroke Patients Only)       Balance Overall balance assessment: Needs assistance Sitting-balance support: Feet supported Sitting balance-Leahy Scale: Good     Standing balance support: During functional activity;Single extremity supported Standing balance-Leahy Scale: Fair                               Pertinent Vitals/Pain Pain Assessment: Faces Faces Pain Scale: Hurts little more Pain Location: back Pain Descriptors / Indicators: Sore Pain Intervention(s): Monitored during session    Home Living Family/patient expects to be discharged to:: Private residence Living Arrangements: Spouse/significant other;Children Available Help at Discharge: Family Type of Home: House Home Access: Stairs to enter Entrance Stairs-Rails: None Secretary/administrator of Steps: 1 Home Layout: One level        Prior Function Level of Independence: Independent               Hand Dominance   Dominant Hand: Right    Extremity/Trunk Assessment        Lower Extremity Assessment Lower Extremity Assessment: (Poor effort, inconsistent findings upon testing)       Communication      Cognition Arousal/Alertness: Awake/alert Behavior During Therapy: Flat affect Overall Cognitive Status: No family/caregiver present to determine baseline cognitive functioning  General Comments      Exercises     Assessment/Plan    PT Assessment Patient needs continued PT services  PT Problem List Decreased strength;Decreased activity tolerance;Decreased balance;Decreased mobility;Pain       PT Treatment Interventions DME instruction;Gait training;Stair training;Functional mobility training;Therapeutic  activities;Therapeutic exercise;Balance training;Patient/family education    PT Goals (Current goals can be found in the Care Plan section)  Acute Rehab PT Goals Patient Stated Goal: to find out whats going on PT Goal Formulation: With patient Time For Goal Achievement: 09/01/17 Potential to Achieve Goals: Good    Frequency Min 3X/week   Barriers to discharge        Co-evaluation               AM-PAC PT "6 Clicks" Daily Activity  Outcome Measure Difficulty turning over in bed (including adjusting bedclothes, sheets and blankets)?: A Little Difficulty moving from lying on back to sitting on the side of the bed? : A Little Difficulty sitting down on and standing up from a chair with arms (e.g., wheelchair, bedside commode, etc,.)?: A Little Help needed moving to and from a bed to chair (including a wheelchair)?: A Little Help needed walking in hospital room?: A Little Help needed climbing 3-5 steps with a railing? : A Lot 6 Click Score: 17    End of Session Equipment Utilized During Treatment: Gait belt Activity Tolerance: Patient tolerated treatment well;Patient limited by fatigue Patient left: in bed;with call bell/phone within reach;with bed alarm set Nurse Communication: Mobility status PT Visit Diagnosis: Unsteadiness on feet (R26.81);Difficulty in walking, not elsewhere classified (R26.2)    Time: [id number] PT Time Calculation (min) (ACUTE ONLY): 20 min   Charges:   PT Evaluation $PT Eval Moderate Complexity: 1 Mod     PT G Codes:        Charlotte Crumbevon Terron Merfeld, PT DPT  Board Certified Neurologic Specialist [phone]   Fabio AsaDevon J Rosamund Nyland 08/18/2017, 3:23 PM

## 2017-08-18 NOTE — Progress Notes (Addendum)
 @IPLOG @        PROGRESS NOTE                                                                                                                                                                                                             Patient Demographics:    Peter Barr, is a 32 y.o. male, DOB - 1985/04/14, ZHY:[id number]  Admit date - 08/14/2017   Admitting Physician Burnadette PopAmrit Adhikari, MD  Outpatient Primary MD for the patient is Clovis RileyMitchell, L.August Saucerean, MD  LOS - 4  Chief Complaint  Patient presents with  . Seizures       Brief Narrative 32 year old African-American male with history of seizure disorder who came to the ER after 2 episodes of generalized tonic-clonic seizures at home, in the ER was found to be febrile, he was out of his medications for 2 days.  He had LP which rules out bacterial meningitis or viral meningitis, protein count slightly elevated at this time we are waiting for HSV PCR to be back and CSF until then continue acyclovir, if HSV negative stop acyclovir and discharged home.  Neuro following.   Subjective:   Patient in bed, appears comfortable, mild generalized headache, no fever, no chest pain or pressure, no shortness of breath , no abdominal pain. No focal weakness.  Overall headache much improved, chronic low back pain continues, right shoulder pain and discomfort also better.  No confusion this morning.  Asked him to sit up in the chair.   Assessment  & Plan :     1.  Breakthrough seizure.  Likely due to noncompliance with his home medications which he forgot to refill.  Neuro following, currently on home dose Lamictal along with Zonegran ordered by neurology, head CT negative, initial LP did not obtain sufficient CSF and a repeat LP has been ordered through IR on 08/14/2017 unfortunately it could not be done until 08/17/2017 due to scheduling problems.  CSF consistent with a bloody tap, findings discussed with neurologist Dr. Jerrell BelfastAurora and ID physician Dr. Rosetta Posneromar, stop  all antibiotics, likely no infection, due to mildly elevated protein when accounted for bloody tap continue acyclovir only till HSV PCR results are back and are negative.  Patient for neurological infection remains minimal.  I had a detailed discussion with patient's wife according to the wife whenever he gets a breakthrough seizure he has a headache and confusion for the next 3 to 4 days, his back pain is chronic.   Please activity.  PT eval, encouraged to sit up in  chair.  Once HSV PCR is negative stop acyclovir and discharged home.   2.  Suspected and meningitis/encephalitis.  Currently see #1 above.  3.  Mild ARF.  Due to combination of NSAIDs and acyclovir.  Hydrate, ultrasound is unremarkable.  Repeat BMP tomorrow.   4. Ongoing right shoulder pain since his fall.  X-rays unremarkable.  Pain is much improved, range of motion has improved, no MRI, outpatient follow-up with PCP supportive care.    Diet :  Diet Order           Diet Heart Room service appropriate? Yes; Fluid consistency: Thin  Diet effective now           Family Communication  :  None  Code Status :  Full  Disposition Plan  :  Home once better  Consults  :  Neuro, IR  Procedures  :    LP x 1 - insufficient CSF obtained  Repeat LP ordered, canceled by IR in error on 08/14/2017, reordered again on 08/15/2017 90 done on 08/17/2017- CSF was extremely bloody, WBC count stable when accounted for the blood, minimal proteinuria also could be due to bloody tap.    DVT Prophylaxis  :  Lovenox/ SCDs    Lab Results  Component Value Date   PLT 216 08/18/2017    Inpatient Medications  Scheduled Meds: . enoxaparin (LOVENOX) injection  40 mg Subcutaneous Once  . lamoTRIgine  200 mg Oral Daily  . pantoprazole  40 mg Oral Daily  . zonisamide  200 mg Oral QHS   Continuous Infusions: . sodium chloride 75 mL/hr at 08/17/17 1551  . acyclovir Stopped (08/18/17 0410)   PRN Meds:.acetaminophen, ipratropium-albuterol,  LORazepam, magic mouthwash w/lidocaine, ondansetron (ZOFRAN) IV  Antibiotics  :    Anti-infectives (From admission, onward)   Start     Dose/Rate Route Frequency Ordered Stop   08/14/17 2200  acyclovir (ZOVIRAX) 615 mg in dextrose 5 % 100 mL IVPB  Status:  Discontinued     5 mg/kg  122.5 kg 112.3 mL/hr over 60 Minutes Intravenous Every 12 hours 08/14/17 1112 08/14/17 1135   08/14/17 2200  cefTRIAXone (ROCEPHIN) 2 g in sodium chloride 0.9 % 100 mL IVPB  Status:  Discontinued     2 g 200 mL/hr over 30 Minutes Intravenous Every 12 hours 08/14/17 1135 08/18/17 0738   08/14/17 2000  acyclovir (ZOVIRAX) 940 mg in dextrose 5 % 150 mL IVPB     10 mg/kg  94.2 kg (Adjusted) 168.8 mL/hr over 60 Minutes Intravenous Every 8 hours 08/14/17 1135     08/14/17 2000  vancomycin (VANCOCIN) 1,250 mg in sodium chloride 0.9 % 250 mL IVPB  Status:  Discontinued     1,250 mg 166.7 mL/hr over 90 Minutes Intravenous Every 8 hours 08/14/17 1241 08/17/17 1504   08/14/17 1230  vancomycin (VANCOCIN) 1,500 mg in sodium chloride 0.9 % 500 mL IVPB     1,500 mg 250 mL/hr over 120 Minutes Intravenous  Once 08/14/17 1136 08/14/17 1439   08/14/17 1115  cefTRIAXone (ROCEPHIN) 2 g in sodium chloride 0.9 % 100 mL IVPB  Status:  Discontinued     2 g 200 mL/hr over 30 Minutes Intravenous Every 24 hours 08/14/17 1112 08/14/17 1135   08/14/17 1030  vancomycin (VANCOCIN) IVPB 1000 mg/200 mL premix     1,000 mg 200 mL/hr over 60 Minutes Intravenous  Once 08/14/17 1023 08/14/17 1158   08/14/17 1030  cefTRIAXone (ROCEPHIN) 2 g in sodium chloride 0.9 % 100  mL IVPB     2 g 200 mL/hr over 30 Minutes Intravenous  Once 08/14/17 1023 08/14/17 1127   08/14/17 1030  acyclovir (ZOVIRAX) 940 mg in dextrose 5 % 150 mL IVPB     10 mg/kg  94.2 kg (Adjusted) 168.8 mL/hr over 60 Minutes Intravenous  Once 08/14/17 1023 08/14/17 1523         Objective:   Vitals:   08/17/17 2000 08/18/17 0019 08/18/17 0429 08/18/17 0729  BP: (!) 136/100  120/81 (!) 129/91 131/85  Pulse: 79 83 79 79  Resp: 18 18 18 16   Temp: 98.1 F (36.7 C) 98 F (36.7 C) 97.9 F (36.6 C) 98.1 F (36.7 C)  TempSrc: Oral Oral Oral Oral  SpO2: 93% 94% 92% 93%  Weight:      Height:        Wt Readings from Last 3 Encounters:  08/14/17 122.5 kg (270 lb)  09/18/16 124.3 kg (274 lb)  02/22/16 127.5 kg (281 lb 3 oz)     Intake/Output Summary (Last 24 hours) at 08/18/2017 1105 Last data filed at 08/18/2017 0830 Gross per 24 hour  Intake 1160.33 ml  Output 950 ml  Net 210.33 ml     Physical Exam  Awake Alert, Oriented X 3, No new F.N deficits, Normal affect [place].AT,PERRAL Supple Neck,No JVD, No cervical lymphadenopathy appriciated.  Symmetrical Chest wall movement, Good air movement bilaterally, CTAB RRR,No Gallops, Rubs or new Murmurs, No Parasternal Heave +ve B.Sounds, Abd Soft, No tenderness, No organomegaly appriciated, No rebound - guarding or rigidity. No Cyanosis, Clubbing or edema, No new Rash or bruise mild right shoulder pain with limited range of motion due to pain     Data Review:    CBC Recent Labs  Lab 08/14/17 0723 08/15/17 0904 08/16/17 0921 08/17/17 0449 08/18/17 0537  WBC 15.4* 8.8 8.3 8.6 8.1  HGB 17.4* 14.7 15.3 15.3 15.0  HCT 50.3 44.6 46.9 47.1 46.1  PLT 234 195 223 224 216  MCV 81.7 81.7 81.8 82.3 82.3  MCH 28.2 26.9 26.7 26.7 26.8  MCHC 34.6 33.0 32.6 32.5 32.5  RDW 13.1 13.0 13.0 12.9 13.0  LYMPHSABS 1.1 1.2  --   --   --   MONOABS 1.1* 0.7  --   --   --   EOSABS 0.0 0.1  --   --   --   BASOSABS 0.0 0.0  --   --   --     Chemistries  Recent Labs  Lab 08/14/17 0723 08/15/17 0904 08/16/17 0921 08/17/17 0449 08/18/17 0537  NA 139 140 138 142 140  K 4.0 3.2* 3.4* 3.8 3.6  CL 111 110 108 109 108  CO2 20* 23 24 24 24   GLUCOSE 115* 122* 131* 108* 106*  BUN 12 6 <5* <5* 7  CREATININE 1.11 1.32* 1.29* 1.35* 1.43*  CALCIUM 9.2 8.3* 8.4* 9.2 9.0  MG 2.1 1.7 1.9 1.9  --   AST 35 40  --   --   --    ALT 47 37  --   --   --   ALKPHOS 92 70  --   --   --   BILITOT 0.7 0.9  --   --   --    ------------------------------------------------------------------------------------------------------------------ No results for input(s): CHOL, HDL, LDLCALC, TRIG, CHOLHDL, LDLDIRECT in the last 72 hours.  No results found for: HGBA1C ------------------------------------------------------------------------------------------------------------------ No results for input(s): TSH, T4TOTAL, T3FREE, THYROIDAB in the last 72 hours.  Invalid input(s): FREET3 ------------------------------------------------------------------------------------------------------------------  No results for input(s): VITAMINB12, FOLATE, FERRITIN, TIBC, IRON, RETICCTPCT in the last 72 hours.  Coagulation profile Recent Labs  Lab 08/15/17 0904  INR 1.15    No results for input(s): DDIMER in the last 72 hours.  Cardiac Enzymes No results for input(s): CKMB, TROPONINI, MYOGLOBIN in the last 168 hours.  Invalid input(s): CK ------------------------------------------------------------------------------------------------------------------ No results found for: BNP  Micro Results Recent Results (from the past 240 hour(s))  Blood culture (routine x 2)     Status: None (Preliminary result)   Collection Time: 08/14/17  8:36 AM  Result Value Ref Range Status   Specimen Description   Final    BLOOD RIGHT HAND Performed at [facility], [address], [place], [place] [address]    Special Requests   Final    BOTTLES DRAWN AEROBIC AND ANAEROBIC Blood Culture adequate volume Performed at [facility], [address] [place], [place] [address]    Culture   Final    NO GROWTH 3 DAYS Performed at [facility], [address] [place], [place] [address]    Report Status PENDING  Incomplete  Blood culture (routine x 2)     Status: None (Preliminary result)   Collection Time:  08/14/17  8:36 AM  Result Value Ref Range Status   Specimen Description   Final    BLOOD RIGHT ANTECUBITAL Performed at [facility], [address], [place], [place] [address]    Special Requests   Final    BOTTLES DRAWN AEROBIC AND ANAEROBIC Blood Culture adequate volume Performed at [facility], [address], [place], [place] [address]    Culture   Final    NO GROWTH 3 DAYS Performed at [facility], [address], [place], [place] [address]    Report Status PENDING  Incomplete  CSF culture     Status: None   Collection Time: 08/14/17  9:54 AM  Result Value Ref Range Status   Specimen Description   Final    CSF Performed at [facility], [address], [place], [place] [address]    Special Requests   Final    NONE Performed at [facility], [address], [place], [place] [address]    Gram Stain   Final    WBC PRESENT,BOTH PMN AND MONONUCLEAR NO ORGANISMS SEEN CYTOSPIN SMEAR Gram Stain Report Called to,Read Back By and Verified With: VAUGHN,L @ 1145 ON [id number] BY POTEAT,S Performed at [facility], [address] [place], [place] [address]    Culture   Final    NO GROWTH 3 DAYS Performed at [facility], [address], [place], [place] [address]    Report Status 08/17/2017 FINAL  Final  CSF culture     Status: None (Preliminary result)   Collection Time: 08/17/17 10:54 AM  Result Value Ref Range Status   Specimen Description CSF  Final   Special Requests NONE  Final   Gram Stain NO WBC SEEN NO ORGANISMS SEEN   Final   Culture   Final    NO GROWTH < 24 HOURS Performed at [facility], [address] [place], [place] [address]    Report Status PENDING  Incomplete    Radiology Reports Dg Chest 2 View  Result Date: 08/14/2017 CLINICAL DATA:  Seizure EXAM: CHEST - 2 VIEW COMPARISON:  10/23/2015 FINDINGS: Chronic elevation of the right  hemidiaphragm. Poor inspiration  today. Chronic volume loss at the right lung base, but with accentuation due to the poor inspiration. Can't rule out active basilar atelectasis or pneumonia. Upper lungs are clear. IMPRESSION: Poor inspiration. Chronic elevation of the right hemidiaphragm. Chronic volume loss at the right base. Volume loss at both lung bases is accentuated due to the poor inspiration. Can't rule out basilar atelectasis or pneumonia. Electronically Signed   By: Paulina Fusi M.D.   On: 08/14/2017 09:29   Dg Shoulder Right  Result Date: 08/14/2017 CLINICAL DATA:  32 year old male status post seizures, severe right shoulder and arm pain. EXAM: RIGHT SHOULDER - 2+ VIEW COMPARISON:  12/24/2015 FINDINGS: Bone mineralization is within normal limits. There is no evidence of fracture or dislocation. There is no evidence of arthropathy or other focal bone abnormality. Soft tissues are unremarkable. IMPRESSION: Negative. Electronically Signed   By: Odessa Fleming M.D.   On: 08/14/2017 07:46   Ct Head Wo Contrast  Result Date: 08/14/2017 CLINICAL DATA:  Patient was in bed and had seizures. Patient has known seizure disorder. Evaluate for intracranial abnormality or cervical spine ligamentous injury. EXAM: CT HEAD WITHOUT CONTRAST CT CERVICAL SPINE WITHOUT CONTRAST TECHNIQUE: Multidetector CT imaging of the head and cervical spine was performed following the standard protocol without intravenous contrast. Multiplanar CT image reconstructions of the cervical spine were also generated. COMPARISON:  CT head 12/24/2015. FINDINGS: The patient was unable to remain motionless for the exam. Small or subtle lesions could be overlooked. CT HEAD FINDINGS Brain: No evidence for acute infarction, hemorrhage, mass lesion, hydrocephalus, or extra-axial fluid. Normal for age cerebral volume. No definite white matter disease. Vascular: No hyperdense vessel or unexpected calcification. Skull: Normal. Negative for fracture or focal  lesion. Sinuses/Orbits: No acute finding. Other: Compared with prior head CT, no change. CT CERVICAL SPINE FINDINGS Significant motion degradation. Alignment: Straightening of the normal cervical lordosis. Skull base and vertebrae: No definite fracture on this motion degraded scan. Soft tissues and spinal canal: No definite prevertebral fluid. Cannot assess for canal hematoma. Disc levels:  Intervertebral disc spaces are preserved. Upper chest: Respiratory motion. No visible pneumothorax or large mass. Other: None. IMPRESSION: Unremarkable CT head without contrast. No skull fracture, intracranial hemorrhage, or visible mass lesion on this noncontrast exam. Motion degraded cervical spine CT demonstrating no gross fracture or subluxation. Electronically Signed   By: Elsie Stain M.D.   On: 08/14/2017 08:21   Ct Cervical Spine Wo Contrast  Result Date: 08/14/2017 CLINICAL DATA:  Patient was in bed and had seizures. Patient has known seizure disorder. Evaluate for intracranial abnormality or cervical spine ligamentous injury. EXAM: CT HEAD WITHOUT CONTRAST CT CERVICAL SPINE WITHOUT CONTRAST TECHNIQUE: Multidetector CT imaging of the head and cervical spine was performed following the standard protocol without intravenous contrast. Multiplanar CT image reconstructions of the cervical spine were also generated. COMPARISON:  CT head 12/24/2015. FINDINGS: The patient was unable to remain motionless for the exam. Small or subtle lesions could be overlooked. CT HEAD FINDINGS Brain: No evidence for acute infarction, hemorrhage, mass lesion, hydrocephalus, or extra-axial fluid. Normal for age cerebral volume. No definite white matter disease. Vascular: No hyperdense vessel or unexpected calcification. Skull: Normal. Negative for fracture or focal lesion. Sinuses/Orbits: No acute finding. Other: Compared with prior head CT, no change. CT CERVICAL SPINE FINDINGS Significant motion degradation. Alignment: Straightening of the  normal cervical lordosis. Skull base and vertebrae: No definite fracture on this motion degraded scan. Soft tissues and spinal canal: No definite prevertebral fluid. Cannot  assess for canal hematoma. Disc levels:  Intervertebral disc spaces are preserved. Upper chest: Respiratory motion. No visible pneumothorax or large mass. Other: None. IMPRESSION: Unremarkable CT head without contrast. No skull fracture, intracranial hemorrhage, or visible mass lesion on this noncontrast exam. Motion degraded cervical spine CT demonstrating no gross fracture or subluxation. Electronically Signed   By: Elsie Stain M.D.   On: 08/14/2017 08:21   US Renal  Result Date: 08/18/2017 CLINICAL DATA:  Acute renal failure. EXAM: RENAL / URINARY TRACT ULTRASOUND COMPLETE COMPARISON:  None. FINDINGS: Right Kidney: Length: 12.5 cm. Echogenicity within normal limits. No mass or hydronephrosis visualized. Left Kidney: Length: 12.8 cm. Echogenicity within normal limits. No mass or hydronephrosis visualized. Bladder: Appears normal for degree of bladder distention. IMPRESSION: 1. Normal renal sonogram. Electronically Signed   By: Signa Kell M.D.   On: 08/18/2017 09:23   Dg Chest Port 1 View  Result Date: 08/17/2017 CLINICAL DATA:  Shortness of breath. EXAM: PORTABLE CHEST 1 VIEW COMPARISON:  08/14/2017 FINDINGS: As seen previously, there is elevation of the right hemidiaphragm. There is pulmonary venous hypertension without frank edema. Chronic volume loss at the right lung base appears similar to the previous studies. No sign of effusion. IMPRESSION: Chronic elevation of the right hemidiaphragm with chronic volume loss at the right lung base. Pulmonary venous hypertension without frank edema. Electronically Signed   By: Paulina Fusi M.D.   On: 08/17/2017 13:54   Dg Humerus Right  Result Date: 08/14/2017 CLINICAL DATA:  Acute right arm pain after multiple seizures last night. EXAM: RIGHT HUMERUS - 2+ VIEW COMPARISON:  None.  FINDINGS: There is no evidence of fracture or other focal bone lesions. Soft tissues are unremarkable. IMPRESSION: Normal right humerus. Electronically Signed   By: Lupita Raider, M.D.   On: 08/14/2017 07:50   Dg Fluoro Guide Lumbar Puncture  Result Date: 08/17/2017 CLINICAL DATA:  Headaches, concern for meningitis. Seizure disorder. EXAM: DIAGNOSTIC LUMBAR PUNCTURE UNDER FLUOROSCOPIC GUIDANCE FLUOROSCOPY TIME:  Radiation Exposure Index (as provided by the fluoroscopic device): 51.4 If the device does not provide the exposure index: Fluoroscopy Time (in minutes and seconds):  1 minutes 30 seconds Number of Acquired Images:  1 PROCEDURE: Informed consent was obtained from the patient prior to the procedure, including potential complications of headache, allergy, and pain. With the patient prone, the lower back was prepped with Betadine. 1% Lidocaine was used for local anesthesia. Lumbar puncture was performed at the L4-L5 level using a 20 gauge needle with return of blood tinged CSF with an opening pressure of 12 cm water. Four ml of CSF were obtained for laboratory studies. Limited CSF fluid was obtained. The patient was uncomfortable with a feet down orientation (reverse Trendelenburg) which limited CSF flow. The patient tolerated the procedure well and there were no apparent complications. IMPRESSION: Successful lumbar puncture with limited fluid. Electronically Signed   By: Genevive Bi M.D.   On: 08/17/2017 11:52   Dg Fluoro Guide Lumbar Puncture  Result Date: 08/17/2017 Lorna Few, MD     08/17/2017 11:51 AM PROCEDURE: Informed consent was obtained from the patient prior to the procedure, including potential complications of headache, allergy, and pain. With the patient prone, the lower back was prepped with Betadine. 1% Lidocaine was used for local anesthesia. Lumbar puncture was performed at the [L4-L5] level using a [20] gauge needle with return of [blood tinged] CSF with an opening pressure  of [12] cm water. [Four] ml of CSF were obtained for laboratory  studies.  Limited CSF fluid was obtained.  The patient was uncomfortable with a feet down orientation (reverse Trendelenburg) which limited CSF flow.  The patient tolerated the procedure well and there were no apparent complications. Successful lumbar puncture with limited fluid.    Time Spent in minutes  30   Susa Raring M.D on 08/18/2017 at 11:05 AM  Between 7am to 7pm - Pager - [phone] ( page via [email or web address], text pages only, please mention full 10 digit call back number). After 7pm go to [email or web address] - password [email or web address]

## 2017-08-19 ENCOUNTER — Inpatient Hospital Stay (HOSPITAL_COMMUNITY): Payer: Medicaid Other

## 2017-08-19 DIAGNOSIS — G8929 Other chronic pain: Secondary | ICD-10-CM

## 2017-08-19 DIAGNOSIS — N179 Acute kidney failure, unspecified: Secondary | ICD-10-CM

## 2017-08-19 DIAGNOSIS — M5440 Lumbago with sciatica, unspecified side: Secondary | ICD-10-CM

## 2017-08-19 DIAGNOSIS — M25511 Pain in right shoulder: Secondary | ICD-10-CM

## 2017-08-19 LAB — CULTURE, BLOOD (ROUTINE X 2)
Culture: NO GROWTH
Culture: NO GROWTH
Special Requests: ADEQUATE
Special Requests: ADEQUATE

## 2017-08-19 LAB — CBC
HEMATOCRIT: 46 % (ref 39.0–52.0)
HEMOGLOBIN: 15.2 g/dL (ref 13.0–17.0)
MCH: 27 pg (ref 26.0–34.0)
MCHC: 33 g/dL (ref 30.0–36.0)
MCV: 81.9 fL (ref 78.0–100.0)
Platelets: 244 10*3/uL (ref 150–400)
RBC: 5.62 MIL/uL (ref 4.22–5.81)
RDW: 12.8 % (ref 11.5–15.5)
WBC: 10.4 10*3/uL (ref 4.0–10.5)

## 2017-08-19 LAB — BASIC METABOLIC PANEL
ANION GAP: 5 (ref 5–15)
BUN: 10 mg/dL (ref 6–20)
CHLORIDE: 110 mmol/L (ref 101–111)
CO2: 25 mmol/L (ref 22–32)
Calcium: 9.1 mg/dL (ref 8.9–10.3)
Creatinine, Ser: 1.51 mg/dL — ABNORMAL HIGH (ref 0.61–1.24)
GFR calc Af Amer: >60 mL/min (ref >60)
GFR calc non Af Amer: >60 mL/min (ref >60)
GLUCOSE: 100 mg/dL — AB (ref 65–99)
POTASSIUM: 3.5 mmol/L (ref 3.5–5.1)
Sodium: 140 mmol/L (ref 135–145)

## 2017-08-19 MED ORDER — SODIUM CHLORIDE 0.9 % IV SOLN
INTRAVENOUS | Status: DC
Start: 2017-08-19 — End: 2017-08-22
  Administered 2017-08-19 – 2017-08-20 (×3): via INTRAVENOUS

## 2017-08-19 MED ORDER — SODIUM CHLORIDE 0.9 % IV BOLUS
500.0000 mL | Freq: Once | INTRAVENOUS | Status: AC
  Administered 2017-08-19: 500 mL via INTRAVENOUS

## 2017-08-19 MED ORDER — ENOXAPARIN SODIUM 60 MG/0.6ML ~~LOC~~ SOLN
60.0000 mg | SUBCUTANEOUS | Status: DC
  Administered 2017-08-19 – 2017-08-20 (×2): 60 mg via SUBCUTANEOUS
  Filled 2017-08-19 (×3): qty 0.6

## 2017-08-19 MED ORDER — TRAMADOL HCL 50 MG PO TABS
50.0000 mg | ORAL_TABLET | Freq: Once | ORAL | Status: AC | PRN
  Administered 2017-08-19: 50 mg via ORAL
  Filled 2017-08-19: qty 1

## 2017-08-19 MED ORDER — TRAMADOL HCL 50 MG PO TABS
50.0000 mg | ORAL_TABLET | Freq: Once | ORAL | Status: AC
  Administered 2017-08-19: 50 mg via ORAL
  Filled 2017-08-19 (×2): qty 1

## 2017-08-19 NOTE — Progress Notes (Signed)
PROGRESS NOTE    Peter Barr  ZOX:[id number] DOB: [birth year] DOA: 08/14/2017 PCP: [name], L.[name], MD   Brief Narrative:  32 year old BM PMHx  seizure disorder   who came to the ER after 2 episodes of generalized tonic-clonic seizures at home, in the ER was found to be febrile, he was out of his medications for 2 days.  He had LP which rules out bacterial meningitis or viral meningitis, protein count slightly elevated at this time we are waiting for HSV PCR to be back and CSF until then continue acyclovir, if HSV negative stop acyclovir and discharged home.  Neuro following.    Subjective: 6/12 A/O x4, negative CP, negative S OB, negative abdominal pain.  Patient states only missed a couple doses of his medication last week secondary to running out.  Dates his Neurologist fired him, secondary to missing appointments Dr Ernst Breach Neurology?    Assessment & Plan:   Principal Problem:   Seizure (HCC) Active Problems:   Lactic acidosis  Breakthrough seizure - Most likely secondary noncompliance with medication.  Patient does admit to missing medication doses last week secondary to running out of medication. - Counseled at length regarding the necessity to NEVER run out of his medication as this could cause stroke, DEATH. - LP negative for infection see results below.  Antibiotics/antivirals discontinued - Lamictal 200 mg daily -Zonisamide 200 mg Qhs  Meningitis/encephalitis? - LP DOES NOT support this diagnosis/antivirals discontinued  Acute renal failure (baseline Cr= 1.11) -Secondary to NSAIDs + Acyclovir? -Hold all nephrotoxic medication - Trend Cr - Normal saline 75 ml/hr  Chronic back pain -Supportive care  - Be judicious with medication which lower seizure threshold such as tramadol or can increase renal failure such as Toradol.  RIGHT shoulder pain - Secondary to fall : X-ray unremarkable.  Supportive care.       DVT prophylaxis: Lovenox Code  Status: Full Family Communication: None Disposition Plan: Discharge next 24 to 48 hours   Consultants:  Neuro hospitalist    Procedures/Significant Events:     I have personally reviewed and interpreted all radiology studies and my findings are as above.  VENTILATOR SETTINGS:    Cultures   Antimicrobials: Anti-infectives (From admission, onward)   Start     Stop   08/14/17 2200  acyclovir (ZOVIRAX) 615 mg in dextrose 5 % 100 mL IVPB  Status:  Discontinued     08/14/17 1135   08/14/17 2200  cefTRIAXone (ROCEPHIN) 2 g in sodium chloride 0.9 % 100 mL IVPB  Status:  Discontinued     08/18/17 0738   08/14/17 2000  acyclovir (ZOVIRAX) 940 mg in dextrose 5 % 150 mL IVPB  Status:  Discontinued     08/19/17 1151   08/14/17 2000  vancomycin (VANCOCIN) 1,250 mg in sodium chloride 0.9 % 250 mL IVPB  Status:  Discontinued     08/17/17 1504   08/14/17 1230  vancomycin (VANCOCIN) 1,500 mg in sodium chloride 0.9 % 500 mL IVPB     08/14/17 1439   08/14/17 1115  cefTRIAXone (ROCEPHIN) 2 g in sodium chloride 0.9 % 100 mL IVPB  Status:  Discontinued     08/14/17 1135   08/14/17 1030  vancomycin (VANCOCIN) IVPB 1000 mg/200 mL premix     08/14/17 1158   08/14/17 1030  cefTRIAXone (ROCEPHIN) 2 g in sodium chloride 0.9 % 100 mL IVPB     08/14/17 1127   08/14/17 1030  acyclovir (ZOVIRAX) 940 mg in dextrose 5 %  150 mL IVPB     08/14/17 1523       Devices    LINES / TUBES:      Continuous Infusions: . acyclovir Stopped (08/19/17 0524)     Objective: Vitals:   08/19/17 0020 08/19/17 0426 08/19/17 0811 08/19/17 0816  BP: (!) 149/86 (!) 142/96 (!) 146/96 (!) 146/96  Pulse: 79 72 75 75  Resp: 16 13 20 20   Temp: 98.2 F (36.8 C) 98.5 F (36.9 C) 98.4 F (36.9 C) 98.4 F (36.9 C)  TempSrc: Oral Oral Oral Oral  SpO2: 97% 95% 94% 94%  Weight:      Height:        Intake/Output Summary (Last 24 hours) at 08/19/2017 0914 Last data filed at 08/19/2017 0815 Gross per 24 hour    Intake 2479.49 ml  Output 2450 ml  Net 29.49 ml   Filed Weights   08/14/17 0509  Weight: 270 lb (122.5 kg)    Examination:  General: A/O x4, No acute respiratory distress Neck:  Negative scars, masses, torticollis, lymphadenopathy, JVD Lungs: Clear to auscultation bilaterally without wheezes or crackles Cardiovascular: Regular rate and rhythm without murmur gallop or rub normal S1 and S2 Abdomen: negative abdominal pain, nondistended, positive soft, bowel sounds, no rebound, no ascites, no appreciable mass Extremities: No significant cyanosis, clubbing, or edema bilateral lower extremities Skin: Negative rashes, lesions, ulcers Psychiatric:  Negative depression, negative anxiety, negative fatigue, negative mania  Central nervous system:  Cranial nerves II through XII intact, tongue/uvula midline, all extremities muscle strength 5/5, sensation intact throughout,  negative dysarthria, negative expressive aphasia, negative receptive aphasia.  .     Data Reviewed: Care during the described time interval was provided by me .  I have reviewed this patient's available data, including medical history, events of note, physical examination, and all test results as part of my evaluation.   CBC: Recent Labs  Lab 08/14/17 0723 08/15/17 0904 08/16/17 0921 08/17/17 0449 08/18/17 0537 08/19/17 0437  WBC 15.4* 8.8 8.3 8.6 8.1 10.4  NEUTROABS 13.1* 6.7  --   --   --   --   HGB 17.4* 14.7 15.3 15.3 15.0 15.2  HCT 50.3 44.6 46.9 47.1 46.1 46.0  MCV 81.7 81.7 81.8 82.3 82.3 81.9  PLT 234 195 223 224 216 244   Basic Metabolic Panel: Recent Labs  Lab 08/14/17 0723 08/15/17 0904 08/16/17 0921 08/17/17 0449 08/18/17 0537 08/19/17 0437  NA 139 140 138 142 140 140  K 4.0 3.2* 3.4* 3.8 3.6 3.5  CL 111 110 108 109 108 110  CO2 20* 23 24 24 24 25   GLUCOSE 115* 122* 131* 108* 106* 100*  BUN 12 6 <5* <5* 7 10  CREATININE 1.11 1.32* 1.29* 1.35* 1.43* 1.51*  CALCIUM 9.2 8.3* 8.4* 9.2 9.0  9.1  MG 2.1 1.7 1.9 1.9  --   --    GFR: Estimated Creatinine Clearance: 95.3 mL/min (A) (by C-G formula based on SCr of 1.51 mg/dL (H)). Liver Function Tests: Recent Labs  Lab 08/14/17 0723 08/15/17 0904  AST 35 40  ALT 47 37  ALKPHOS 92 70  BILITOT 0.7 0.9  PROT 7.8 6.3*  ALBUMIN 4.3 3.1*   No results for input(s): LIPASE, AMYLASE in the last 168 hours. No results for input(s): AMMONIA in the last 168 hours. Coagulation Profile: Recent Labs  Lab 08/15/17 0904  INR 1.15   Cardiac Enzymes: No results for input(s): CKTOTAL, CKMB, CKMBINDEX, TROPONINI in the last 168 hours. BNP (  last 3 results) No results for input(s): PROBNP in the last 8760 hours. HbA1C: No results for input(s): HGBA1C in the last 72 hours. CBG: No results for input(s): GLUCAP in the last 168 hours. Lipid Profile: No results for input(s): CHOL, HDL, LDLCALC, TRIG, CHOLHDL, LDLDIRECT in the last 72 hours. Thyroid Function Tests: No results for input(s): TSH, T4TOTAL, FREET4, T3FREE, THYROIDAB in the last 72 hours. Anemia Panel: No results for input(s): VITAMINB12, FOLATE, FERRITIN, TIBC, IRON, RETICCTPCT in the last 72 hours. Urine analysis:    Component Value Date/Time   COLORURINE STRAW (A) 08/14/2017 0656   APPEARANCEUR CLEAR 08/14/2017 0656   LABSPEC 1.012 08/14/2017 0656   PHURINE 5.0 08/14/2017 0656   GLUCOSEU NEGATIVE 08/14/2017 0656   HGBUR MODERATE (A) 08/14/2017 0656   BILIRUBINUR NEGATIVE 08/14/2017 0656   KETONESUR NEGATIVE 08/14/2017 0656   PROTEINUR NEGATIVE 08/14/2017 0656   UROBILINOGEN 1.0 07/25/2014 1148   NITRITE NEGATIVE 08/14/2017 0656   LEUKOCYTESUR NEGATIVE 08/14/2017 0656   Sepsis Labs: @LABRCNTIP (procalcitonin:4,lacticidven:4)  ) Recent Results (from the past 240 hour(s))  Blood culture (routine x 2)     Status: None (Preliminary result)   Collection Time: 08/14/17  8:36 AM  Result Value Ref Range Status   Specimen Description   Final    BLOOD RIGHT  HAND Performed at [facility], [address] [place], [place] [address]    Special Requests   Final    BOTTLES DRAWN AEROBIC AND ANAEROBIC Blood Culture adequate volume Performed at [facility], [address] [place], [place] [address]    Culture   Final    NO GROWTH 4 DAYS Performed at [facility], [address] [place], [place] [address]    Report Status PENDING  Incomplete  Blood culture (routine x 2)     Status: None (Preliminary result)   Collection Time: 08/14/17  8:36 AM  Result Value Ref Range Status   Specimen Description   Final    BLOOD RIGHT ANTECUBITAL Performed at [facility], [address] [place], [place] [address]    Special Requests   Final    BOTTLES DRAWN AEROBIC AND ANAEROBIC Blood Culture adequate volume Performed at [facility], [address] [place], [place] [address]    Culture   Final    NO GROWTH 4 DAYS Performed at [facility], [address] [place], [place] [address]    Report Status PENDING  Incomplete  CSF culture     Status: None   Collection Time: 08/14/17  9:54 AM  Result Value Ref Range Status   Specimen Description   Final    CSF Performed at [facility], [address] [place], [place] [address]    Special Requests   Final    NONE Performed at [facility], [address] [place], [place] [address]    Gram Stain   Final    WBC PRESENT,BOTH PMN AND MONONUCLEAR NO ORGANISMS SEEN CYTOSPIN SMEAR Gram Stain Report Called to,Read Back By and Verified With: VAUGHN,L @ 1145 ON [phone] BY POTEAT,S Performed at [facility], [address] [place], [place] [address]    Culture   Final    NO GROWTH 3 DAYS Performed at [facility], [address] [place], [place] [address]    Report Status 08/17/2017 FINAL  Final  CSF culture     Status: None (Preliminary result)  Collection Time: 08/17/17 10:54 AM  Result Value Ref Range Status   Specimen Description CSF  Final   Special Requests NONE  Final   Gram Stain NO WBC SEEN NO ORGANISMS SEEN   Final   Culture   Final    NO GROWTH < 24 HOURS Performed at [facility], [address] [place], [place] [address]    Report Status PENDING  Incomplete         Radiology Studies: US Renal  Result Date: 08/18/2017 CLINICAL DATA:  Acute renal failure. EXAM: RENAL / URINARY TRACT ULTRASOUND COMPLETE COMPARISON:  None. FINDINGS: Right Kidney: Length: 12.5 cm. Echogenicity within normal limits. No mass or hydronephrosis visualized. Left Kidney: Length: 12.8 cm. Echogenicity within normal limits. No mass or hydronephrosis visualized. Bladder: Appears normal for degree of bladder distention. IMPRESSION: 1. Normal renal sonogram. Electronically Signed   By: Signa Kell M.D.   On: 08/18/2017 09:23   Dg Chest Port 1 View  Result Date: 08/17/2017 CLINICAL DATA:  Shortness of breath. EXAM: PORTABLE CHEST 1 VIEW COMPARISON:  08/14/2017 FINDINGS: As seen previously, there is elevation of the right hemidiaphragm. There is pulmonary venous hypertension without frank edema. Chronic volume loss at the right lung base appears similar to the previous studies. No sign of effusion. IMPRESSION: Chronic elevation of the right hemidiaphragm with chronic volume loss at the right lung base. Pulmonary venous hypertension without frank edema. Electronically Signed   By: Paulina Fusi M.D.   On: 08/17/2017 13:54   Dg Fluoro Guide Lumbar Puncture  Result Date: 08/17/2017 CLINICAL DATA:  Headaches, concern for meningitis. Seizure disorder. EXAM: DIAGNOSTIC LUMBAR PUNCTURE UNDER FLUOROSCOPIC GUIDANCE FLUOROSCOPY TIME:  Radiation Exposure Index (as provided by the fluoroscopic device): 51.4 If the device does not provide the exposure index: Fluoroscopy Time (in minutes and seconds):  1 minutes 30 seconds Number of Acquired Images:  1  PROCEDURE: Informed consent was obtained from the patient prior to the procedure, including potential complications of headache, allergy, and pain. With the patient prone, the lower back was prepped with Betadine. 1% Lidocaine was used for local anesthesia. Lumbar puncture was performed at the L4-L5 level using a 20 gauge needle with return of blood tinged CSF with an opening pressure of 12 cm water. Four ml of CSF were obtained for laboratory studies. Limited CSF fluid was obtained. The patient was uncomfortable with a feet down orientation (reverse Trendelenburg) which limited CSF flow. The patient tolerated the procedure well and there were no apparent complications. IMPRESSION: Successful lumbar puncture with limited fluid. Electronically Signed   By: Genevive Bi M.D.   On: 08/17/2017 11:52   Dg Fluoro Guide Lumbar Puncture  Result Date: 08/17/2017 Lorna Few, MD     08/17/2017 11:51 AM PROCEDURE: Informed consent was obtained from the patient prior to the procedure, including potential complications of headache, allergy, and pain. With the patient prone, the lower back was prepped with Betadine. 1% Lidocaine was used for local anesthesia. Lumbar puncture was performed at the [L4-L5] level using a [20] gauge needle with return of [blood tinged] CSF with an opening pressure of [12] cm water. [Four] ml of CSF were obtained for laboratory studies.  Limited CSF fluid was obtained.  The patient was uncomfortable with a feet down orientation (reverse Trendelenburg) which limited CSF flow.  The patient tolerated the procedure well and there were no apparent complications. Successful lumbar puncture with limited fluid.        Scheduled Meds: . enoxaparin (  LOVENOX) injection  40 mg Subcutaneous Once  . lamoTRIgine  200 mg Oral Daily  . pantoprazole  40 mg Oral Daily  . zolpidem  10 mg Oral Once  . zonisamide  200 mg Oral QHS   Continuous Infusions: . acyclovir Stopped (08/19/17 0524)      LOS: 5 days    Time spent: 40 minutes    Sherlie Boyum, Roselind Messier, MD [facility] Pager [phone]   If 7PM-7AM, please contact night-coverage [email or web address] Password [facility] 08/19/2017, 9:14 AM

## 2017-08-19 NOTE — Progress Notes (Signed)
Pt reports 8/10 back pain. Unresolved with tylenol and hot pack. MD notified, verbal order given for 1 time dose tramadol.

## 2017-08-19 NOTE — Progress Notes (Signed)
Pt. eturned form MRI states "I am very dizzy, my head is just spinning, I need help"  Paged [facility] on call

## 2017-08-19 NOTE — Progress Notes (Signed)
ANTICOAGULATION CONSULT NOTE - Initial Consult  Pharmacy Consult for Lovenox Indication: VTE prophylaxis  Allergies  Allergen Reactions  . Depakote Er [Divalproex Sodium Er] Other (See Comments)    Caused burns on arm  . Dilantin [Phenytoin Sodium Extended] Other (See Comments)    Caused burns on arm   . Adhesive [Tape] Itching and Rash    Heart monitor sticky pads (leads)  . Tapentadol Itching and Rash    Heart monitor sticky pads (leads)  . Tramadol Palpitations    Spasms, dizzy (also)    Patient Measurements: Height: 5\' 11"  (180.3 cm) Weight: 270 lb (122.5 kg) IBW/kg (Calculated) : 75.3   Vital Signs: Temp: 98.4 F (36.9 C) (06/12 1545) Temp Source: Oral (06/12 1545) BP: 132/99 (06/12 1545) Pulse Rate: 79 (06/12 1545)  Labs: Recent Labs    08/17/17 0449 08/18/17 0537 08/19/17 0437  HGB 15.3 15.0 15.2  HCT 47.1 46.1 46.0  PLT 224 216 244  CREATININE 1.35* 1.43* 1.51*    Estimated Creatinine Clearance: 95.3 mL/min (A) (by C-G formula based on SCr of 1.51 mg/dL (H)).   Medical History: Past Medical History:  Diagnosis Date  . Anxiety   . Complication of anesthesia    " one time I had a convulsion "  . Hypertension   . Migraine   . Seizures (HCC)     Medications:  Medications Prior to Admission  Medication Sig Dispense Refill Last Dose  . acetaminophen (TYLENOL) 500 MG tablet Take 1,000 mg by mouth every 8 (eight) hours as needed for mild pain.    Past Month at Unknown time  . cyclobenzaprine (FLEXERIL) 5 MG tablet Take 1 tablet (5 mg total) by mouth every 8 (eight) hours as needed for muscle spasms. 30 tablet 0 08/13/2017 at Unknown time  . lamoTRIgine (LAMICTAL) 200 MG tablet Take 1 tablet (200 mg total) by mouth daily. Take 1 tablet each morning and 1 1/2 tablets each night 75 tablet 1 08/13/2017 at 2000  . zonisamide (ZONEGRAN) 100 MG capsule SEE NOTES (Patient taking differently: 200 MG at night) 270 capsule 3 08/13/2017 at Unknown time   Scheduled:   . enoxaparin (LOVENOX) injection  40 mg Subcutaneous Once  . lamoTRIgine  200 mg Oral Daily  . pantoprazole  40 mg Oral Daily  . traMADol  50 mg Oral Once  . zolpidem  10 mg Oral Once  . zonisamide  200 mg Oral QHS    Assessment: 32 y.o male with seizure disorder. Pharmacy consulted to dose sq lovenox for vte prophylaxis due to BMI >30 and SCr has increased to 1.5.  Estimated CrCl is 95 ml/min (using adjusted body weight).   Weight 122.5 kg , BMI = 37 CBC wnl  Last received Lovenox 40mg  SQ on 08/16/16.    Goal of Therapy:  Monitor platelets by anticoagulation protocol: Yes   Plan:  Lovenox 60mg  sq q24h for VTE prophylaxis F/u SCr at least weekly  Noah Delaineuth Pat Sires, [facility] [phone] [facility] 08/19/2017,8:12 PM

## 2017-08-19 NOTE — Progress Notes (Addendum)
Subjective: Major complaint at this time is back pain and right shoulder pain.  States he can move his arm but is painful from the shoulder all the way down to the hand.  When attempting to assess his arm he gives very poor effort.  No other complaints  Exam: Vitals:   08/19/17 0811 08/19/17 0816  BP: (!) 146/96 (!) 146/96  Pulse: 75 75  Resp: 20 20  Temp: 98.4 F (36.9 C) 98.4 F (36.9 C)  SpO2: 94% 94%    Physical Exam   HEENT-  Normocephalic, no lesions, without obvious abnormality.  Normal external eye and conjunctiva.   Extremities- Warm, dry and intact Musculoskeletal-right shoulder pain and back pain Skin-warm and dry, no hyperpigmentation, vitiligo, or suspicious lesions    Neuro:  Mental Status: Alert, oriented, thought content appropriate.  Speech fluent without evidence of aphasia.  Able to follow 3 step commands without difficulty. Cranial Nerves: II:  Visual fields grossly normal,  III,IV, VI: ptosis not present, extra-ocular motions intact bilaterally pupils equal, round, reactive to light and accommodation V,VII: smile symmetric, facial light touch sensation normal bilaterally VIII: hearing normal bilaterally IX,X: uvula rises symmetrically XI: bilateral shoulder shrug XII: midline tongue extension Motor: Right : Upper extremity   3/5 (secondary to pain)    Left:     Upper extremity   5/5  Lower extremity   5/5     Lower extremity   5/5 -Right upper extremity shows a very poor effort.  Patient states he has pain in that arm. Also has stiffness.  Tone and bulk:normal tone throughout; no atrophy noted Sensory: Pinprick and light touch intact throughout, bilaterally Deep Tendon Reflexes: 2+ and symmetric throughout Plantars: Mute bilaterally Cerebellar: Left normal finger-to-nose,      Medications:  Scheduled: . enoxaparin (LOVENOX) injection  40 mg Subcutaneous Once  . lamoTRIgine  200 mg Oral Daily  . pantoprazole  40 mg Oral Daily  . zolpidem  10  mg Oral Once  . zonisamide  200 mg Oral QHS   Continuous: . acyclovir Stopped (08/19/17 0524)    Pertinent Labs/Diagnostics: CSF culture negative HSV negative     Felicie MornDavid Smith PA-C [facility] [phone]  Assessment:As noted prior :32 year [facility] secondary to having 2 seizures.   As stated prior --he does have a seizure history with left temporal lobe activity. At time of admission he was currently on 200 mg Lamictal in the morning and 300milligrams Lamictal at night this was [facility]. He initially was placed on Keppra but this was DC'd. He has had no further breakthrough seizures. Seizures most likely secondary to missing 2 days of his medication secondary to noncompliance.    CSF HSV and culture has come back at this time and is negative  Impression: -Breakthrough seizure secondary to antiepileptic noncompliance -Abnormal protein in CSF, likely secondary to seizure.  Negative HSV PCR in the CSF.  Recommendations: -Continue home dose Lamictal with zonisamide --Arm pain eval per primary team.   Seizure precautions as below: Per [facility] [place] DM[place] statutes, patients with seizures are not allowed to drive until they have been seizure-free for six months. Use caution when using heavy equipment or power tools. Avoid working on ladders or at heights. Take showers instead of baths. Ensure the water temperature is not too high on the home water heater. Do not go swimming alone. When caring for infants or small children, sit down when holding, feeding, or changing  them to minimize risk of injury to the child in the event you have a seizure.  Also, Maintain good sleep hygiene. Avoid alcohol.   Neurology will be available as needed.  Please call with questions.  Patient should follow-up with outpatient neurology in the coming 2 to 4 weeks.   08/19/2017, 10:21 AM -- Milon Dikes, MD [facility] Pager: [phone] If 7pm to 7am, please call on call as listed on AMION.

## 2017-08-20 ENCOUNTER — Encounter: Payer: Self-pay | Admitting: Neurology

## 2017-08-20 DIAGNOSIS — S43431A Superior glenoid labrum lesion of right shoulder, initial encounter: Secondary | ICD-10-CM

## 2017-08-20 LAB — CREATININE, SERUM
CREATININE: 1.32 mg/dL — AB (ref 0.61–1.24)
GFR calc Af Amer: >60 mL/min (ref >60)
GFR calc non Af Amer: >60 mL/min (ref >60)

## 2017-08-20 LAB — CSF CULTURE

## 2017-08-20 LAB — CSF CULTURE W GRAM STAIN
Culture: NO GROWTH
Gram Stain: NONE SEEN

## 2017-08-20 MED ORDER — KETOROLAC TROMETHAMINE 30 MG/ML IJ SOLN
30.0000 mg | Freq: Once | INTRAMUSCULAR | Status: AC
  Administered 2017-08-20: 30 mg via INTRAVENOUS
  Filled 2017-08-20: qty 1

## 2017-08-20 MED ORDER — CYCLOBENZAPRINE HCL 10 MG PO TABS
10.0000 mg | ORAL_TABLET | Freq: Three times a day (TID) | ORAL | Status: DC | PRN
  Administered 2017-08-20: 10 mg via ORAL
  Filled 2017-08-20: qty 1

## 2017-08-20 MED ORDER — METOCLOPRAMIDE HCL 5 MG/ML IJ SOLN
10.0000 mg | Freq: Once | INTRAMUSCULAR | Status: AC
  Administered 2017-08-20: 10 mg via INTRAVENOUS
  Filled 2017-08-20: qty 2

## 2017-08-20 MED ORDER — SUMATRIPTAN SUCCINATE 6 MG/0.5ML ~~LOC~~ SOLN
6.0000 mg | Freq: Once | SUBCUTANEOUS | Status: AC
  Administered 2017-08-20: 6 mg via SUBCUTANEOUS
  Filled 2017-08-20: qty 0.5

## 2017-08-20 MED ORDER — DIPHENHYDRAMINE HCL 50 MG/ML IJ SOLN
25.0000 mg | Freq: Once | INTRAMUSCULAR | Status: AC
  Administered 2017-08-20: 25 mg via INTRAVENOUS
  Filled 2017-08-20: qty 1

## 2017-08-20 NOTE — Progress Notes (Signed)
Physical Therapy Treatment Patient Details Name: Peter Barr MRN: [id number] DOB: 17-May-1985 Today's Date: 08/20/2017    History of Present Illness 32 year old African-American male with history of seizure disorder who came to the ER after 2 episodes of generalized tonic-clonic seizures at home    PT Comments    Patient sitting up in recliner upon arrival and agreeable to participate in therapy. Pt required min guard for STS transfers and mod/max A for gait this session. Pt with inconsistencies with presentation of gait pattern during session and with much different presentation than on evaluation 6/11. PT will continue to follow and assess for d/c needs.    Follow Up Recommendations  No PT follow up     Equipment Recommendations  None recommended by PT    Recommendations for Other Services       Precautions / Restrictions Precautions Precautions: Fall    Mobility  Bed Mobility               General bed mobility comments: Significant amount of time to perform, poor effort noted  Transfers Overall transfer level: Needs assistance   Transfers: Sit to/from Stand Sit to Stand: Min guard         General transfer comment: min guard for safety when standing  Ambulation/Gait Ambulation/Gait assistance: Mod assist;Max assist Gait Distance (Feet): 70 Feet Assistive device: Rolling walker (2 wheeled);1 person hand held assist Gait Pattern/deviations: Step-to pattern;Decreased stride length;Wide base of support;Step-through pattern;Decreased step length - right;Ataxic;Staggering left;Staggering right;Trunk flexed     General Gait Details: initially ambulating in room with HHA +1 with short step lengths and unsteadiness but no overt LOB and then began demonstrating ataxia with bilat LE; for safety ambulated the rest of the way with RW and pt presented with improved steadiness initially with AD but heavy reliance on bilat UE; pt began demonstrating shorter R step lengths  and almost dragging R foot behind and then with step through pattern again and on way back to room with bilat knee buckling and staggering R and L; pt asked to sit in arm chair at entrance to room for safety and pt very quickly sat with poor eccentric loading; recliner was brought close to pt and pt able to perform stand pivot transfer to recliner with min guard and with improved eccentric loading and control   Stairs             Wheelchair Mobility    Modified Rankin (Stroke Patients Only)       Balance Overall balance assessment: Needs assistance Sitting-balance support: Feet supported Sitting balance-Leahy Scale: Good     Standing balance support: During functional activity;Bilateral upper extremity supported Standing balance-Leahy Scale: Poor                              Cognition Arousal/Alertness: Awake/alert Behavior During Therapy: WFL for tasks assessed/performed Overall Cognitive Status: No family/caregiver present to determine baseline cognitive functioning                                        Exercises      General Comments        Pertinent Vitals/Pain Pain Assessment: No/denies pain Pain Location: face Pain Descriptors / Indicators: Numbness    Home Living  Prior Function            PT Goals (current goals can now be found in the care plan section) Acute Rehab PT Goals PT Goal Formulation: With patient Time For Goal Achievement: 09/01/17 Potential to Achieve Goals: Good Progress towards PT goals: Progressing toward goals    Frequency    Min 3X/week      PT Plan Current plan remains appropriate    Co-evaluation              AM-PAC PT "6 Clicks" Daily Activity  Outcome Measure  Difficulty turning over in bed (including adjusting bedclothes, sheets and blankets)?: A Little Difficulty moving from lying on back to sitting on the side of the bed? : A Little Difficulty  sitting down on and standing up from a chair with arms (e.g., wheelchair, bedside commode, etc,.)?: A Little Help needed moving to and from a bed to chair (including a wheelchair)?: A Little Help needed walking in hospital room?: A Lot Help needed climbing 3-5 steps with a railing? : A Lot 6 Click Score: 16    End of Session Equipment Utilized During Treatment: Gait belt Activity Tolerance: Patient tolerated treatment well Patient left: with call bell/phone within reach;in chair;with chair alarm set Nurse Communication: Mobility status PT Visit Diagnosis: Unsteadiness on feet (R26.81);Difficulty in walking, not elsewhere classified (R26.2)     Time: 1610-9604 PT Time Calculation (min) (ACUTE ONLY): 23 min  Charges:  $Gait Training: 8-22 mins $Therapeutic Activity: 8-22 mins                    G Codes:       Erline Levine, PTA Pager: [phone]     Carolynne Edouard 08/20/2017, 4:19 PM

## 2017-08-20 NOTE — Progress Notes (Signed)
PROGRESS NOTE    Peter Barr  UJW:[id number] DOB: 06/14/1985 DOA: 08/14/2017 PCP: [name], L.[name], MD   Brief Narrative:  32 year old BM PMHx  seizure disorder   who came to the ER after 2 episodes of generalized tonic-clonic seizures at home, in the ER was found to be febrile, he was out of his medications for 2 days.  He had LP which rules out bacterial meningitis or viral meningitis, protein count slightly elevated at this time we are waiting for HSV PCR to be back and CSF until then continue acyclovir, if HSV negative stop acyclovir and discharged home.  Neuro following.    Subjective: 6/13 A/O x4, negative CP, negative S OB, negative abdominal pain.  Continued right shoulder pain.     Assessment & Plan:   Principal Problem:   Seizure (HCC) Active Problems:   Lactic acidosis  Breakthrough seizure - Most likely secondary noncompliance with medication.  Patient does admit to missing medication doses last week secondary to running out of medication. - Counseled at length regarding the necessity to NEVER run out of his medication as this could cause stroke, DEATH. - LP negative for infection see results below.  Antibiotics/antivirals discontinued - Lamictal 200 mg daily -Zonisamide 200 mg Qhs - Staff attempted to obtain follow-up appointment with Dr Patrcia Dolly [facility] but was informed that patient had not missed just 3 appointments in 3 years but had missed multiple appointments, and had canceled multiple appointments, in addition to being noncompliant with medication and per their SOP patient was discharged from their and will not be readmitted. -Schedule establish care appointment with Dr. Joycelyn Schmid [facility] in 2 to 4 weeks.  Breakthrough seizure noncompliance medication.  Meningitis/encephalitis? - LP DOES NOT support this diagnosis/antivirals discontinued  Acute renal failure (baseline Cr= 1.11) -Secondary to NSAIDs +  Acyclovir? -Hold all nephrotoxic medication Recent Labs  Lab 08/14/17 0723 08/15/17 0904 08/16/17 0921 08/17/17 0449 08/18/17 0537 08/19/17 0437 08/20/17 0428  CREATININE 1.11 1.32* 1.29* 1.35* 1.43* 1.51* 1.32*  - Normal saline 75 ml/hr -Schedule follow-up appointment in 1 to 2 weeks Dr. Lupe Carney acute renal failure, chronic back pain, breakthrough seizure  Chronic back pain -Supportive care  - Be judicious with medication which lower seizure threshold such as tramadol or can increase renal failure such as Toradol.  Nondisplaced posterior inferior glenoid labral tear - Secondary to fall , sustained posterior shoulder dislocation with subsequent spontaneous reduction see MRI report below. - On 6/14 consult orthopedic surgery       DVT prophylaxis: Lovenox Code Status: Full Family Communication: None Disposition Plan: Discharge next 24 to 48 hours   Consultants:  Neuro hospitalist    Procedures/Significant Events:  6/7 LP 6/7 DG RIGHT shoulder negative 6/7 DG RIGHT humerus negative 6/7 CT head Wo contrast:/C-spine: Negative 6/11 renal ultrasound: Normal 6/12 MRI RIGHT shoulder wo contrast:-Reverse Hill-Sachs deformity of the humeral head involving the anteromedial superior aspect. This in conjunction with marrow edema of the posterior glenoid indicate that the patient sustained a posterior shoulder dislocation with subsequent spontaneous reduction as the humeral head appears to be anatomically aligned with the glenoid currently. - Nondisplaced posterior inferior glenoid labral tear - Intact rotator cuff.     I have personally reviewed and interpreted all radiology studies and my findings are as above.  VENTILATOR SETTINGS:    Cultures 6/7 blood negative 6/7 CSF negative 6/7 HIV negative 6/10 CSF: HSV-1/HSV-2 negative      Antimicrobials: Anti-infectives (From admission, onward)  Start     Stop   08/14/17 2200  acyclovir (ZOVIRAX) 615 mg in  dextrose 5 % 100 mL IVPB  Status:  Discontinued     08/14/17 1135   08/14/17 2200  cefTRIAXone (ROCEPHIN) 2 g in sodium chloride 0.9 % 100 mL IVPB  Status:  Discontinued     08/18/17 0738   08/14/17 2000  acyclovir (ZOVIRAX) 940 mg in dextrose 5 % 150 mL IVPB  Status:  Discontinued     08/19/17 1151   08/14/17 2000  vancomycin (VANCOCIN) 1,250 mg in sodium chloride 0.9 % 250 mL IVPB  Status:  Discontinued     08/17/17 1504   08/14/17 1230  vancomycin (VANCOCIN) 1,500 mg in sodium chloride 0.9 % 500 mL IVPB     08/14/17 1439   08/14/17 1115  cefTRIAXone (ROCEPHIN) 2 g in sodium chloride 0.9 % 100 mL IVPB  Status:  Discontinued     08/14/17 1135   08/14/17 1030  vancomycin (VANCOCIN) IVPB 1000 mg/200 mL premix     08/14/17 1158   08/14/17 1030  cefTRIAXone (ROCEPHIN) 2 g in sodium chloride 0.9 % 100 mL IVPB     08/14/17 1127   08/14/17 1030  acyclovir (ZOVIRAX) 940 mg in dextrose 5 % 150 mL IVPB     08/14/17 1523       Devices    LINES / TUBES:      Continuous Infusions: . sodium chloride 75 mL/hr at 08/20/17 0556     Objective: Vitals:   08/19/17 1545 08/19/17 2049 08/20/17 0423 08/20/17 0729  BP: (!) 132/99 (!) 148/99 (!) 137/100 122/87  Pulse: 79 68 74 70  Resp: 20 18 16 16   Temp: 98.4 F (36.9 C) 98.5 F (36.9 C) 98.3 F (36.8 C) 97.8 F (36.6 C)  TempSrc: Oral Oral Oral Oral  SpO2: 99% 94% 95% 96%  Weight:      Height:        Intake/Output Summary (Last 24 hours) at 08/20/2017 0933 Last data filed at 08/20/2017 0600 Gross per 24 hour  Intake 1320 ml  Output 1250 ml  Net 70 ml   Filed Weights   08/14/17 0509  Weight: 270 lb (122.5 kg)    Examination:  General: A/O x4, No acute respiratory distress Neck:  Negative scars, masses, torticollis, lymphadenopathy, JVD Lungs: Clear to auscultation bilaterally without wheezes or crackles Cardiovascular: Regular rate and rhythm without murmur gallop or rub normal S1 and S2 Abdomen: negative abdominal pain,  nondistended, positive soft, bowel sounds, no rebound, no ascites, no appreciable mass Extremities: No significant cyanosis, clubbing, or edema bilateral lower extremities Skin: Negative rashes, lesions, ulcers Psychiatric:  Negative depression, negative anxiety, negative fatigue, negative mania  Central nervous system:  Cranial nerves II through XII intact, tongue/uvula midline, all extremities muscle strength 5/5, sensation intact throughout,  negative dysarthria, negative expressive aphasia, negative receptive aphasia.  .     Data Reviewed: Care during the described time interval was provided by me .  I have reviewed this patient's available data, including medical history, events of note, physical examination, and all test results as part of my evaluation.   CBC: Recent Labs  Lab 08/14/17 0723 08/15/17 0904 08/16/17 0921 08/17/17 0449 08/18/17 0537 08/19/17 0437  WBC 15.4* 8.8 8.3 8.6 8.1 10.4  NEUTROABS 13.1* 6.7  --   --   --   --   HGB 17.4* 14.7 15.3 15.3 15.0 15.2  HCT 50.3 44.6 46.9 47.1 46.1 46.0  MCV  81.7 81.7 81.8 82.3 82.3 81.9  PLT 234 195 223 224 216 244   Basic Metabolic Panel: Recent Labs  Lab 08/14/17 0723 08/15/17 0904 08/16/17 0921 08/17/17 0449 08/18/17 0537 08/19/17 0437 08/20/17 0428  NA 139 140 138 142 140 140  --   K 4.0 3.2* 3.4* 3.8 3.6 3.5  --   CL 111 110 108 109 108 110  --   CO2 20* 23 24 24 24 25   --   GLUCOSE 115* 122* 131* 108* 106* 100*  --   BUN 12 6 <5* <5* 7 10  --   CREATININE 1.11 1.32* 1.29* 1.35* 1.43* 1.51* 1.32*  CALCIUM 9.2 8.3* 8.4* 9.2 9.0 9.1  --   MG 2.1 1.7 1.9 1.9  --   --   --    GFR: Estimated Creatinine Clearance: 109 mL/min (A) (by C-G formula based on SCr of 1.32 mg/dL (H)). Liver Function Tests: Recent Labs  Lab 08/14/17 0723 08/15/17 0904  AST 35 40  ALT 47 37  ALKPHOS 92 70  BILITOT 0.7 0.9  PROT 7.8 6.3*  ALBUMIN 4.3 3.1*   No results for input(s): LIPASE, AMYLASE in the last 168 hours. No  results for input(s): AMMONIA in the last 168 hours. Coagulation Profile: Recent Labs  Lab 08/15/17 0904  INR 1.15   Cardiac Enzymes: No results for input(s): CKTOTAL, CKMB, CKMBINDEX, TROPONINI in the last 168 hours. BNP (last 3 results) No results for input(s): PROBNP in the last 8760 hours. HbA1C: No results for input(s): HGBA1C in the last 72 hours. CBG: No results for input(s): GLUCAP in the last 168 hours. Lipid Profile: No results for input(s): CHOL, HDL, LDLCALC, TRIG, CHOLHDL, LDLDIRECT in the last 72 hours. Thyroid Function Tests: No results for input(s): TSH, T4TOTAL, FREET4, T3FREE, THYROIDAB in the last 72 hours. Anemia Panel: No results for input(s): VITAMINB12, FOLATE, FERRITIN, TIBC, IRON, RETICCTPCT in the last 72 hours. Urine analysis:    Component Value Date/Time   COLORURINE STRAW (A) 08/14/2017 0656   APPEARANCEUR CLEAR 08/14/2017 0656   LABSPEC 1.012 08/14/2017 0656   PHURINE 5.0 08/14/2017 0656   GLUCOSEU NEGATIVE 08/14/2017 0656   HGBUR MODERATE (A) 08/14/2017 0656   BILIRUBINUR NEGATIVE 08/14/2017 0656   KETONESUR NEGATIVE 08/14/2017 0656   PROTEINUR NEGATIVE 08/14/2017 0656   UROBILINOGEN 1.0 07/25/2014 1148   NITRITE NEGATIVE 08/14/2017 0656   LEUKOCYTESUR NEGATIVE 08/14/2017 0656   Sepsis Labs: @LABRCNTIP (procalcitonin:4,lacticidven:4)  ) Recent Results (from the past 240 hour(s))  Blood culture (routine x 2)     Status: None   Collection Time: 08/14/17  8:36 AM  Result Value Ref Range Status   Specimen Description   Final    BLOOD RIGHT HAND Performed at [facility], [address] [place], [place] [address]    Special Requests   Final    BOTTLES DRAWN AEROBIC AND ANAEROBIC Blood Culture adequate volume Performed at [facility], [address] [place], [place] [address]    Culture   Final    NO GROWTH 5 DAYS Performed at [facility], [address] [place], [place] [address]     Report Status 08/19/2017 FINAL  Final  Blood culture (routine x 2)     Status: None   Collection Time: 08/14/17  8:36 AM  Result Value Ref Range Status   Specimen Description   Final    BLOOD RIGHT ANTECUBITAL Performed at [facility], [address] [place], [place]  [address]    Special Requests   Final    BOTTLES DRAWN AEROBIC AND ANAEROBIC Blood Culture adequate volume Performed at [facility], [address], [place], [place] [address]    Culture   Final    NO GROWTH 5 DAYS Performed at [facility], [address], [place], [place] [address]    Report Status 08/19/2017 FINAL  Final  CSF culture     Status: None   Collection Time: 08/14/17  9:54 AM  Result Value Ref Range Status   Specimen Description   Final    CSF Performed at [facility], [address] [place], [place] [address]    Special Requests   Final    NONE Performed at [facility], [address] [place], [place] [address]    Gram Stain   Final    WBC PRESENT,BOTH PMN AND MONONUCLEAR NO ORGANISMS SEEN CYTOSPIN SMEAR Gram Stain Report Called to,Read Back By and Verified With: VAUGHN,L @ 1145 ON [id number] BY POTEAT,S Performed at [facility], [address], [place], [place] [address]    Culture   Final    NO GROWTH 3 DAYS Performed at [facility], [address], [place], [place] [address]    Report Status 08/17/2017 FINAL  Final  CSF culture     Status: None (Preliminary result)   Collection Time: 08/17/17 10:54 AM  Result Value Ref Range Status   Specimen Description CSF  Final   Special Requests NONE  Final   Gram Stain   Final    NO WBC SEEN NO ORGANISMS SEEN Performed at [facility], [address], [place], [place] [address]    Culture NO GROWTH 2 DAYS  Final   Report Status PENDING  Incomplete         Radiology Studies: Mr Shoulder Right Wo Contrast  Result Date:  08/19/2017 CLINICAL DATA:  Patient had a seizure on 08/14/2017 sustaining a fall and presents with shoulder pain. EXAM: MRI OF THE RIGHT SHOULDER WITHOUT CONTRAST TECHNIQUE: Multiplanar, multisequence MR imaging of the shoulder was performed. No intravenous contrast was administered. COMPARISON:  None. FINDINGS: Rotator cuff:  Intact Muscles: No atrophy or abnormal signal of the muscles of the rotator cuff. Biceps long head:  Intact. Acromioclavicular Joint: Normal acromioclavicular joint. Type II curved acromion. No subacromial/subdeltoid bursal fluid. Glenohumeral Joint: Mild chondral thinning without focal chondral defect. No joint effusion. Labrum: Tear of the posterior inferior glenoid labrum, series 5/17 and 18, series 6/13. Bones: Trough sign with marrow edema involving the anteromedial humeral head compatible with a reverse Hill-Sachs lesion. Associated marrow edema of the posterior glenoid would also be in keeping with stigmata of posterior dislocation. Other: None IMPRESSION: 1. Reverse Hill-Sachs deformity of the humeral head involving the anteromedial superior aspect. This in conjunction with marrow edema of the posterior glenoid indicate that the patient sustained a posterior shoulder dislocation with subsequent spontaneous reduction as the humeral head appears to be anatomically aligned with the glenoid currently. 2. Nondisplaced posterior inferior glenoid labral tear likely a result of the suspected posterior dislocation. 3. Intact rotator cuff. Electronically Signed   By: Tollie Eth M.D.   On: 08/19/2017 20:54        Scheduled Meds: . enoxaparin (LOVENOX) injection  60 mg Subcutaneous Q24H  . lamoTRIgine  200 mg Oral Daily  . pantoprazole  40 mg Oral Daily  . zonisamide  200 mg Oral QHS  Continuous Infusions: . sodium chloride 75 mL/hr at 08/20/17 0556     LOS: 6 days    Time spent: 40 minutes    Yanelie Abraha, Roselind Messier, MD [facility] Pager [phone]   If 7PM-7AM,  please contact night-coverage [email or web address] Password [facility] 08/20/2017, 9:33 AM

## 2017-08-21 ENCOUNTER — Telehealth: Payer: Self-pay | Admitting: *Deleted

## 2017-08-21 DIAGNOSIS — S43431A Superior glenoid labrum lesion of right shoulder, initial encounter: Secondary | ICD-10-CM

## 2017-08-21 DIAGNOSIS — N179 Acute kidney failure, unspecified: Secondary | ICD-10-CM

## 2017-08-21 LAB — BASIC METABOLIC PANEL
Anion gap: 6 (ref 5–15)
BUN: 10 mg/dL (ref 6–20)
CHLORIDE: 108 mmol/L (ref 101–111)
CO2: 25 mmol/L (ref 22–32)
CREATININE: 1.38 mg/dL — AB (ref 0.61–1.24)
Calcium: 9.1 mg/dL (ref 8.9–10.3)
GFR calc non Af Amer: >60 mL/min (ref >60)
Glucose, Bld: 105 mg/dL — ABNORMAL HIGH (ref 65–99)
Potassium: 3.7 mmol/L (ref 3.5–5.1)
Sodium: 139 mmol/L (ref 135–145)

## 2017-08-21 LAB — MAGNESIUM: Magnesium: 1.8 mg/dL (ref 1.7–2.4)

## 2017-08-21 MED ORDER — CYCLOBENZAPRINE HCL 10 MG PO TABS: 10.0000 mg | ORAL_TABLET | Freq: Three times a day (TID) | ORAL | 0 refills | Status: AC | PRN

## 2017-08-21 MED ORDER — ZONISAMIDE 100 MG PO CAPS: 200.0000 mg | ORAL_CAPSULE | Freq: Every day | ORAL | 0 refills | Status: AC

## 2017-08-21 MED ORDER — LAMOTRIGINE 200 MG PO TABS: 200.0000 mg | ORAL_TABLET | Freq: Every day | ORAL | 0 refills | Status: AC

## 2017-08-21 NOTE — Care Management Note (Signed)
Case Management Note  Patient Details  Name: Langston Maskerhillip Chatwin MRN: [id number] Date of Birth: 07-26-1985  Subjective/Objective:                    Action/Plan: Pt discharging home with self care. Pt ambulating in the room independently.  CM was able to obtain him a PCP appointment and information on the AVS.  After PCP appointment pt should be able [facility] get [facility] and pt is aware.  Wife to provide transportation home.   Expected Discharge Date:  (unknown)               Expected Discharge Plan:  Home/Self Care  In-House Referral:     Discharge planning Services  CM Consult(PCP)  Post Acute Care Choice:    Choice offered to:     DME Arranged:    DME Agency:     HH Arranged:    HH Agency:     Status of Service:  Completed, signed off  If discussed at [employer] Length of Stay Meetings, dates discussed:    Additional Comments:  Kermit BaloKelli F Dinari Stgermaine, RN 08/21/2017, 4:42 PM

## 2017-08-21 NOTE — Progress Notes (Addendum)
Physical Therapy Treatment Patient Details Name: Peter Barr MRN: [id number] DOB: [birth year] Today's Date: 08/21/2017    History of Present Illness 32 year old African-American male with history of seizure disorder who came to the ER after 2 episodes of generalized tonic-clonic seizures at home    PT Comments    Patient required min guard to bed mobility and transfers and min-max A (+2 for safety) for gait this session. Pt initially with steady gait with use of AD and then became increasingly unsteady with distance and with multiple LOB requiring assist to recover. At times during session pt presented with blank stare and with change in behavior. Pt reported no memory of just ambulating in hallway and asked "how did we get here?" when taking rest break in hallway. RN notified and in to assess pt end of session. Given pt's current mobility level recommending CIR for further skilled PT services to maximize independence and safety with mobility.    Follow Up Recommendations  CIR;Supervision for mobility/OOB     Equipment Recommendations  Other (comment)(TBD)    Recommendations for Other Services       Precautions / Restrictions Precautions Precautions: Fall    Mobility  Bed Mobility Overal bed mobility: Modified Independent             General bed mobility comments: increased time needed  Transfers Overall transfer level: Needs assistance   Transfers: Sit to/from Stand Sit to Stand: Min guard         General transfer comment: min guard for safety from EOB and recliner  Ambulation/Gait Ambulation/Gait assistance: (min-max A ) Gait Distance (Feet): (47ft X2) Assistive device: Rolling walker (2 wheeled) Gait Pattern/deviations: Decreased stride length;Step-through pattern;Staggering right;Trunk flexed;Staggering left;Wide base of support Gait velocity: decreased   General Gait Details: pt required min A initially with use of RW and then began staggering and with  LOB requiring max A to recover and almost running into wall; recliner was brought up behind pt and pt sat per therapist request; when pt was asked "how are you feeling right now?" pt looked at therapist and replied "hey" and then asked "how did we get here?"; pt began complaining that "room is moving"; RN nostified and in to assess pt due to change in behavior and increased unsteadiness   Stairs             Wheelchair Mobility    Modified Rankin (Stroke Patients Only)       Balance Overall balance assessment: Needs assistance Sitting-balance support: Feet supported Sitting balance-Leahy Scale: Good     Standing balance support: During functional activity;Bilateral upper extremity supported Standing balance-Leahy Scale: Poor                              Cognition Arousal/Alertness: Awake/alert Behavior During Therapy: WFL for tasks assessed/performed Overall Cognitive Status: No family/caregiver present to determine baseline cognitive functioning                                 General Comments: pt with episodes of blank stares during session       Exercises      General Comments        Pertinent Vitals/Pain Pain Assessment: No/denies pain    Home Living                      Prior Function  PT Goals (current goals can now be found in the care plan section) Acute Rehab PT Goals PT Goal Formulation: With patient Time For Goal Achievement: 09/01/17 Potential to Achieve Goals: Good Progress towards PT goals: Progressing toward goals    Frequency    Min 3X/week      PT Plan Discharge plan needs to be updated    Co-evaluation              AM-PAC PT "6 Clicks" Daily Activity  Outcome Measure  Difficulty turning over in bed (including adjusting bedclothes, sheets and blankets)?: A Little Difficulty moving from lying on back to sitting on the side of the bed? : A Little Difficulty sitting down on and  standing up from a chair with arms (e.g., wheelchair, bedside commode, etc,.)?: A Little Help needed moving to and from a bed to chair (including a wheelchair)?: A Little Help needed walking in hospital room?: A Lot Help needed climbing 3-5 steps with a railing? : A Lot 6 Click Score: 16    End of Session Equipment Utilized During Treatment: Gait belt Activity Tolerance: Patient tolerated treatment well Patient left: with call bell/phone within reach;in chair;with chair alarm set Nurse Communication: Mobility status PT Visit Diagnosis: Unsteadiness on feet (R26.81);Difficulty in walking, not elsewhere classified (R26.2)     Time: [id number] PT Time Calculation (min) (ACUTE ONLY): 27 min  Charges:  $Gait Training: 8-22 mins $Therapeutic Activity: 8-22 mins                    G Codes:       Erline LevineKellyn Rafferty Postlewait, PTA Pager: [phone]     Carolynne EdouardKellyn R Areen Trautner 08/21/2017, 1:33 PM

## 2017-08-21 NOTE — Progress Notes (Signed)
patient discharge packet given to patient with prescriptions and follow up appointments were explained.  RN wheeled patient to wife who was waiting in car, pt safely transferred to vehicle.

## 2017-08-21 NOTE — Telephone Encounter (Signed)
I will be happy to see this gentleman.

## 2017-08-21 NOTE — Progress Notes (Signed)
CM consulted for [facility]. CM was able to obtain him an [facility] (pt has seen her in the past and office said he would have to be assigned to her again). The earliest appointment was August 20th. CM asked that patient also be added to the cancellation list in case an earlier appt becomes available. MD updated.

## 2017-08-21 NOTE — Progress Notes (Signed)
Rehab Admissions Coordinator Note:  Patient was screened by Peter Barr, Peter Barr for appropriateness for an Inpatient Acute Rehab Consult per PT recommendation today. Noted pt diagnosis of seizures due to noncompliance with meds. Pt does not meet the medical neccesity for an inpt rehab admission.   Peter Barr, Peter Barr 08/21/2017, 3:14 PM  I can be reached at [phone].

## 2017-08-21 NOTE — Telephone Encounter (Signed)
Patient last seen by Dr. Terrace ArabiaYan and Eber Jonesarolyn in 2015.  He has been referred back to our office, from the ED, for seizures.

## 2017-08-25 LAB — HSV(HERPES SMPLX VRS)ABS-I+II(IGG)-CSF

## 2017-09-15 NOTE — Discharge Summary (Signed)
Physician Discharge Summary  Peter Barr JXB:[id number] DOB: January 05, 1986 DOA: 08/14/2017  PCP: Peter Barr  Admit date: 08/14/2017 Discharge date: 09/15/2017  Time spent: 35 minutes  Outpatient Follow-up:  Breakthrough seizure - Most likely secondary noncompliance with medication.  Patient does admit to missing medication doses last week secondary to running out of medication. - Counseled at length regarding the necessity to NEVER run out of his medication as this could cause stroke, DEATH. - LP negative for infection see results below.  Antibiotics/antivirals discontinued - Lamictal 200 mg daily -Zonisamide 200 mg Qhs - Staff attempted to obtain follow-up appointment with Dr Patrcia Dolly [facility] but was informed that patient had not missed just 3 appointments in 3 years but had missed multiple appointments, and had canceled multiple appointments, in addition to being noncompliant with medication and Barr their SOP patient was discharged from their and will not be readmitted. -Schedule establish care appointment with Dr. Joycelyn Schmid [facility] in 2 to 4 weeks.  Breakthrough seizure noncompliance medication.   Meningitis/encephalitis? - LP DOES NOT support this diagnosis/antivirals discontinued   Acute renal failure (baseline Cr= 1.11) -Secondary to NSAIDs + Acyclovir? -Hold all nephrotoxic medication Last Labs            Recent Labs  Lab 08/15/17 0904 08/16/17 0921 08/17/17 0449 08/18/17 0537 08/19/17 0437 08/20/17 0428 08/21/17 0847  CREATININE 1.32* 1.29* 1.35* 1.43* 1.51* 1.32* 1.38*    - Normal saline 75 ml/hr -Attempt to schedule appointment with PCP Dr. Lupe Carney however patient has not seen a physician in several years and they have refused to accept patient back into practice.  NCM [name] has obtained establish care appointment for patient on Monday at [facility].        Chronic back pain -Supportive care  -  Be judicious with medication which lower seizure threshold such as tramadol or can increase renal failure such as Toradol.   RIGHT shoulder nondisplaced posterior inferior glenoid labral tear - Secondary to fall , sustained posterior shoulder dislocation with subsequent spontaneous reduction see MRI report below. - 6/14 discussed case with Dr. Duwayne Heck orthopedic surgery will see patient as an outpatient.  Patient would need to have his seizures under control for 6 months prior to contemplation of surgical repair. - Schedule establish care appointment with Dr. Duwayne Heck orthopedic surgery in 2 to 4 weeks RIGHT shoulder inferior glenoid labral tear: Steroid injection?     Discharge Diagnoses:  Principal Problem:   Seizure [facility]) Active Problems:   Lactic acidosis   Acute renal failure (HCC)   Glenoid labral tear, right, initial encounter   Discharge Condition: Stable    Filed Weights   08/14/17 0509  Weight: 270 lb (122.5 kg)    History of present illness:  32 year old BM PMHx  seizure disorder    who came to the ER after 2 episodes of generalized tonic-clonic seizures at home, in the ER was found to be febrile, he was out of his medications for 2 days.  He had LP which rules out bacterial meningitis or viral meningitis, protein count slightly elevated at this time we are waiting for HSV PCR to be back and CSF until then continue acyclovir, if HSV negative stop acyclovir and discharged home.  Neuro following.  Evaluated by neuro hospitalist found to have breakthrough seizure, medication adjusted.  Acute renal failure.  Medication adjusted and renal function improved but not yet back to baseline.  Stable for discharge.  Procedures: 6/7 LP 6/7 DG RIGHT shoulder negative 6/7 DG RIGHT humerus negative 6/7 CT head Wo contrast:/C-spine: Negative 6/11 renal ultrasound: Normal 6/12 MRI RIGHT shoulder wo contrast:-Reverse Hill-Sachs deformity of the humeral head involving  the anteromedial superior aspect. This in conjunction with marrow edema of the posterior glenoid indicate that the patient sustained a posterior shoulder dislocation with subsequent spontaneous reduction as the humeral head appears to be anatomically aligned with the glenoid currently. - Nondisplaced posterior inferior glenoid labral tear - Intact rotator cuff. Consultations: Neuro hospitalist   Cultures  6/7 blood negative 6/7 CSF negative 6/7 HIV negative 6/10 CSF: HSV-1/HSV-2 negative    Antibiotics Anti-infectives (From admission, onward)   Start     Stop   08/14/17 2200  acyclovir (ZOVIRAX) 615 mg in dextrose 5 % 100 mL IVPB  Status:  Discontinued     08/14/17 1135   08/14/17 2200  cefTRIAXone (ROCEPHIN) 2 g in sodium chloride 0.9 % 100 mL IVPB  Status:  Discontinued     08/18/17 0738   08/14/17 2000  acyclovir (ZOVIRAX) 940 mg in dextrose 5 % 150 mL IVPB  Status:  Discontinued     08/19/17 1151   08/14/17 2000  vancomycin (VANCOCIN) 1,250 mg in sodium chloride 0.9 % 250 mL IVPB  Status:  Discontinued     08/17/17 1504   08/14/17 1230  vancomycin (VANCOCIN) 1,500 mg in sodium chloride 0.9 % 500 mL IVPB     08/14/17 1439   08/14/17 1115  cefTRIAXone (ROCEPHIN) 2 g in sodium chloride 0.9 % 100 mL IVPB  Status:  Discontinued     08/14/17 1135   08/14/17 1030  vancomycin (VANCOCIN) IVPB 1000 mg/200 mL premix     08/14/17 1158   08/14/17 1030  cefTRIAXone (ROCEPHIN) 2 g in sodium chloride 0.9 % 100 mL IVPB     08/14/17 1127   08/14/17 1030  acyclovir (ZOVIRAX) 940 mg in dextrose 5 % 150 mL IVPB     08/14/17 1523       Discharge Exam: Vitals:   08/21/17 0446 08/21/17 0843 08/21/17 1253 08/21/17 1628  BP: (!) 124/91 (!) 132/96 (!) 142/100 (!) 139/94  Pulse: 73 72 79 75  Resp: 18 (!) 22  18  Temp: 98.1 F (36.7 C)  98 F (36.7 C) 98.1 F (36.7 C)  TempSrc: Oral  Oral Oral  SpO2: 95% 97% 97% 98%  Weight:      Height:        General: A/O x4, No acute respiratory  distress Neck:  Negative scars, masses, torticollis, lymphadenopathy, JVD Lungs: Clear to auscultation bilaterally without wheezes or crackles Cardiovascular: Regular rate and rhythm without murmur gallop or rub normal S1 and S2 Abdomen: negative abdominal pain, nondistended, positive soft, bowel sounds, no rebound, no ascites, no appreciable mass Extremities: No significant cyanosis, clubbing, or edema bilateral lower extremities Skin: Negative rashes, lesions, ulcers Psychiatric:  Negative depression, negative anxiety, negative fatigue, negative mania  Central nervous system:  Cranial nerves II through XII intact, tongue/uvula midline, all extremities muscle strength 5/5, sensation intact throughout,  negative dysarthria, negative expressive aphasia, negative receptive aphasia.  Discharge Instructions   Allergies as of 08/21/2017      Reactions   Depakote Er [divalproex Sodium Er] Other (See Comments)   Caused burns on arm   Dilantin [phenytoin Sodium Extended] Other (See Comments)   Caused burns on arm   Adhesive [tape] Itching, Rash   Heart monitor sticky pads (leads)   Tapentadol Itching,  Rash   Heart monitor sticky pads (leads)   Tramadol Palpitations   Spasms, dizzy (also)      Medication List    STOP taking these medications   acetaminophen 500 MG tablet Commonly known as:  TYLENOL     TAKE these medications   cyclobenzaprine 10 MG tablet Commonly known as:  FLEXERIL Take 1 tablet (10 mg total) by mouth 3 (three) times daily as needed for muscle spasms. What changed:    medication strength  how much to take  when to take this   lamoTRIgine 200 MG tablet Commonly known as:  LAMICTAL Take 1 tablet (200 mg total) by mouth daily. What changed:  additional instructions   zonisamide 100 MG capsule Commonly known as:  ZONEGRAN Take 2 capsules (200 mg total) by mouth at bedtime. What changed:    how much to take  how to take this  when to take  this  additional instructions      Allergies  Allergen Reactions  . Depakote Er [Divalproex Sodium Er] Other (See Comments)    Caused burns on arm  . Dilantin [Phenytoin Sodium Extended] Other (See Comments)    Caused burns on arm   . Adhesive [Tape] Itching and Rash    Heart monitor sticky pads (leads)  . Tapentadol Itching and Rash    Heart monitor sticky pads (leads)  . Tramadol Palpitations    Spasms, dizzy (also)   Follow-up Information    Mitchell, L.August Saucerean, MD. Schedule an appointment as soon as possible for a visit in 2 weeks.   Specialty:  Family Medicine Why:  Pt needs to call office to make appt. since pt hasnt been in office since 2016. Contact information: [address] [place] [address] [phone]        Suanne MarkerPenumalli, Vikram R, MD. Schedule an appointment as soon as possible for a visit in 4 weeks.   Specialties:  Neurology, Radiology Why:  Schedule establish care appointment with Dr. Joycelyn SchmidVikram Penumalli [facility] in 2 to 4 weeks.  Breakthrough seizure noncompliance medication. Contact information: [address] [place] [address] [phone]        Levert FeinsteinYan, Yijun, MD On 10/27/2017.   Specialty:  Neurology Why:  you need to be at the office at 8:30 am.  Contact information: [address] [place] [address] [phone]        Yolonda Kidaogers, Jason Patrick, MD Follow up on 09/03/2017.   Specialty:  Orthopedic Surgery Why:   Please arrive at 2 pm for 2:30 appointment.  RIGHT shoulder inferior glenoid labral tear: Steroid injection? Contact information: [address] [place] [address] [phone]        [facility] Follow up on 08/24/2017.   Why:  Your need to be at office around 10:45 for paperwork. Appt is at 11:15 Contact information: [address] [place], [place]  [phone]           The results of significant diagnostics from this  hospitalization (including imaging, microbiology, ancillary and laboratory) are listed below for reference.    Significant Diagnostic Studies: [place]as Renal  Result Date: 08/18/2017 CLINICAL DATA:  Acute renal failure. EXAM: RENAL / URINARY TRACT ULTRASOUND COMPLETE COMPARISON:  None. FINDINGS: Right Kidney: Length: 12.5 cm. Echogenicity within normal limits. No mass or hydronephrosis visualized. Left Kidney: Length: 12.8 cm. Echogenicity within normal limits. No mass or hydronephrosis visualized. Bladder: Appears normal for degree of bladder distention. IMPRESSION:  1. Normal renal sonogram. Electronically Signed   By: Signa Kell M.D.   On: 08/18/2017 09:23   Mr Shoulder Right Wo Contrast  Result Date: 08/19/2017 CLINICAL DATA:  Patient had a seizure on 08/14/2017 sustaining a fall and presents with shoulder pain. EXAM: MRI OF THE RIGHT SHOULDER WITHOUT CONTRAST TECHNIQUE: Multiplanar, multisequence MR imaging of the shoulder was performed. No intravenous contrast was administered. COMPARISON:  None. FINDINGS: Rotator cuff:  Intact Muscles: No atrophy or abnormal signal of the muscles of the rotator cuff. Biceps long head:  Intact. Acromioclavicular Joint: Normal acromioclavicular joint. Type II curved acromion. No subacromial/subdeltoid bursal fluid. Glenohumeral Joint: Mild chondral thinning without focal chondral defect. No joint effusion. Labrum: Tear of the posterior inferior glenoid labrum, series 5/17 and 18, series 6/13. Bones: Trough sign with marrow edema involving the anteromedial humeral head compatible with a reverse Hill-Sachs lesion. Associated marrow edema of the posterior glenoid would also be in keeping with stigmata of posterior dislocation. Other: None IMPRESSION: 1. Reverse Hill-Sachs deformity of the humeral head involving the anteromedial superior aspect. This in conjunction with marrow edema of the posterior glenoid indicate that the patient sustained a posterior shoulder dislocation  with subsequent spontaneous reduction as the humeral head appears to be anatomically aligned with the glenoid currently. 2. Nondisplaced posterior inferior glenoid labral tear likely a result of the suspected posterior dislocation. 3. Intact rotator cuff. Electronically Signed   By: Tollie Eth M.D.   On: 08/19/2017 20:54   Dg Chest Port 1 View  Result Date: 08/17/2017 CLINICAL DATA:  Shortness of breath. EXAM: PORTABLE CHEST 1 VIEW COMPARISON:  08/14/2017 FINDINGS: As seen previously, there is elevation of the right hemidiaphragm. There is pulmonary venous hypertension without frank edema. Chronic volume loss at the right lung base appears similar to the previous studies. No sign of effusion. IMPRESSION: Chronic elevation of the right hemidiaphragm with chronic volume loss at the right lung base. Pulmonary venous hypertension without frank edema. Electronically Signed   By: Paulina Fusi M.D.   On: 08/17/2017 13:54   Dg Fluoro Guide Lumbar Puncture  Result Date: 08/17/2017 CLINICAL DATA:  Headaches, concern for meningitis. Seizure disorder. EXAM: DIAGNOSTIC LUMBAR PUNCTURE UNDER FLUOROSCOPIC GUIDANCE FLUOROSCOPY TIME:  Radiation Exposure Index (as provided by the fluoroscopic device): 51.4 If the device does not provide the exposure index: Fluoroscopy Time (in minutes and seconds):  1 minutes 30 seconds Number of Acquired Images:  1 PROCEDURE: Informed consent was obtained from the patient prior to the procedure, including potential complications of headache, allergy, and pain. With the patient prone, the lower back was prepped with Betadine. 1% Lidocaine was used for local anesthesia. Lumbar puncture was performed at the L4-L5 level using a 20 gauge needle with return of blood tinged CSF with an opening pressure of 12 cm water. Four ml of CSF were obtained for laboratory studies. Limited CSF fluid was obtained. The patient was uncomfortable with a feet down orientation (reverse Trendelenburg) which limited  CSF flow. The patient tolerated the procedure well and there were no apparent complications. IMPRESSION: Successful lumbar puncture with limited fluid. Electronically Signed   By: Genevive Bi M.D.   On: 08/17/2017 11:52   Dg Fluoro Guide Lumbar Puncture  Result Date: 08/17/2017 Lorna Few, MD     08/17/2017 11:51 AM PROCEDURE: Informed consent was obtained from the patient prior to the procedure, including potential complications of headache, allergy, and pain. With the patient prone, the lower back was prepped with Betadine. 1% Lidocaine  was used for local anesthesia. Lumbar puncture was performed at the [L4-L5] level using a [20] gauge needle with return of [blood tinged] CSF with an opening pressure of [12] cm water. [Four] ml of CSF were obtained for laboratory studies.  Limited CSF fluid was obtained.  The patient was uncomfortable with a feet down orientation (reverse Trendelenburg) which limited CSF flow.  The patient tolerated the procedure well and there were no apparent complications. Successful lumbar puncture with limited fluid.    Microbiology: No results found for this or any previous visit (from the past 240 hour(s)).   Labs: Basic Metabolic Panel: No results for input(s): NA, K, CL, CO2, GLUCOSE, BUN, CREATININE, CALCIUM, MG, PHOS in the last 168 hours. Liver Function Tests: No results for input(s): AST, ALT, ALKPHOS, BILITOT, PROT, ALBUMIN in the last 168 hours. No results for input(s): LIPASE, AMYLASE in the last 168 hours. No results for input(s): AMMONIA in the last 168 hours. CBC: No results for input(s): WBC, NEUTROABS, HGB, HCT, MCV, PLT in the last 168 hours. Cardiac Enzymes: No results for input(s): CKTOTAL, CKMB, CKMBINDEX, TROPONINI in the last 168 hours. BNP: BNP (last 3 results) No results for input(s): BNP in the last 8760 hours.  ProBNP (last 3 results) No results for input(s): PROBNP in the last 8760 hours.  CBG: No results for input(s): GLUCAP  in the last 168 hours.     Signed:  Carolyne Littles, MD [facility] [phone] pager

## 2017-10-27 ENCOUNTER — Institutional Professional Consult (permissible substitution): Payer: Self-pay | Admitting: Neurology

## 2017-10-27 ENCOUNTER — Encounter

## 2017-10-27 ENCOUNTER — Telehealth: Payer: Self-pay | Admitting: Neurology

## 2017-10-27 NOTE — Telephone Encounter (Signed)
This patient canceled the same day of a new patient appointment. 

## 2017-10-28 ENCOUNTER — Encounter: Payer: Self-pay | Admitting: Neurology

## 2019-08-28 IMAGING — US US RENAL
1 series · 14 of 25 positions shown · non-contrast
Comparison: None.

CLINICAL DATA: Acute renal failure.

EXAM:
RENAL / URINARY TRACT ULTRASOUND COMPLETE

[Series 1: us renal · 0.28mm/px · 14 of 29 slices shown]
[im 1/29]
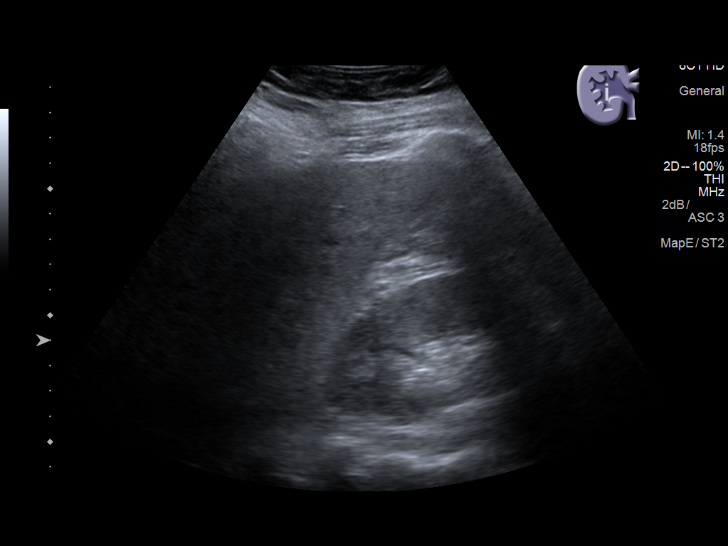
[im 3/29]
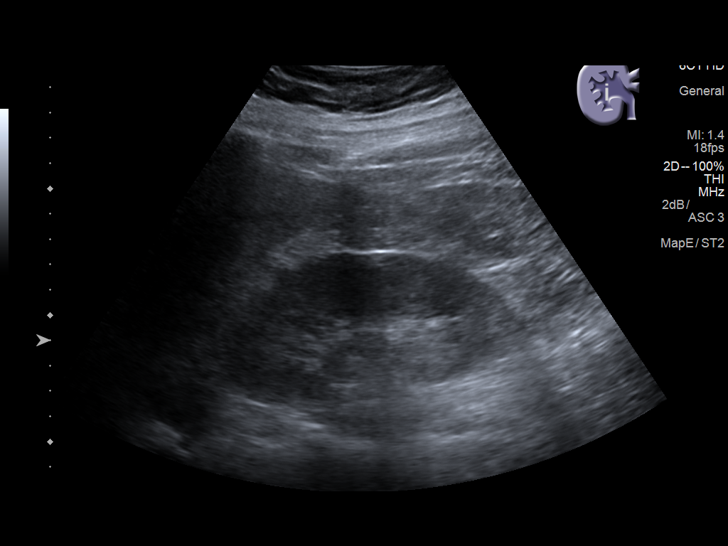
[im 5/29]
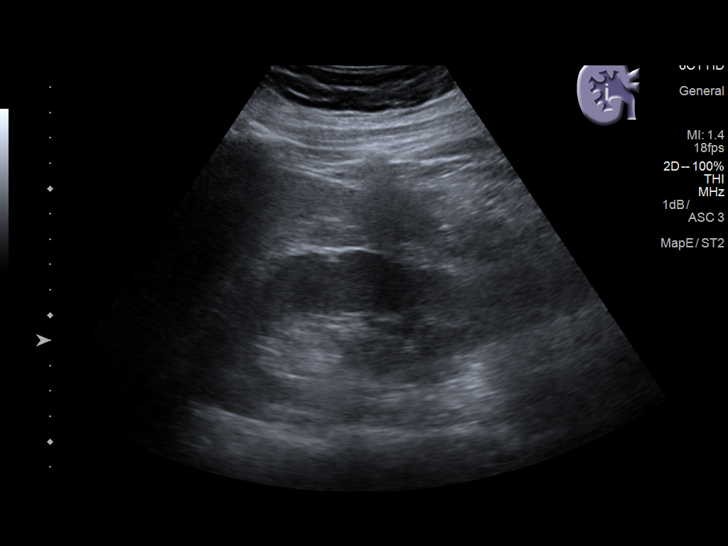
[im 8/29]
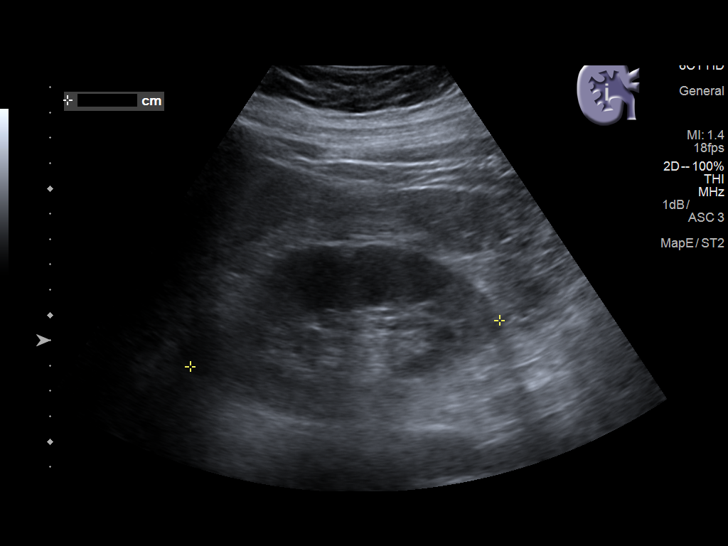
[im 10/29]
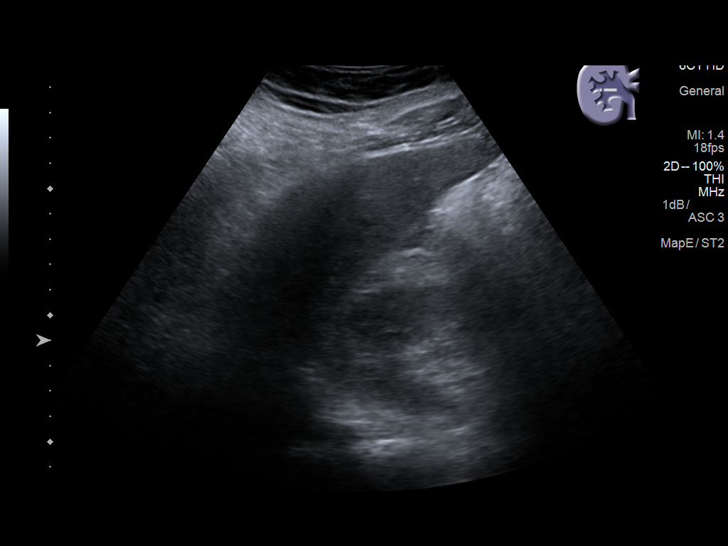
[im 11/29]
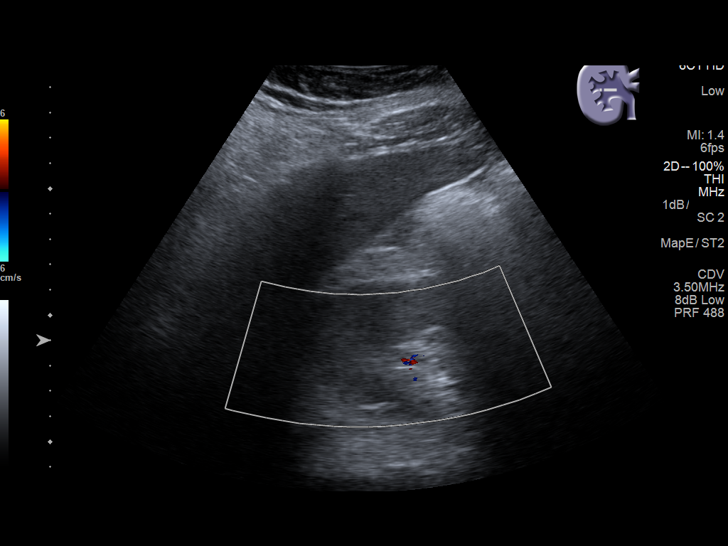
[im 13/29]
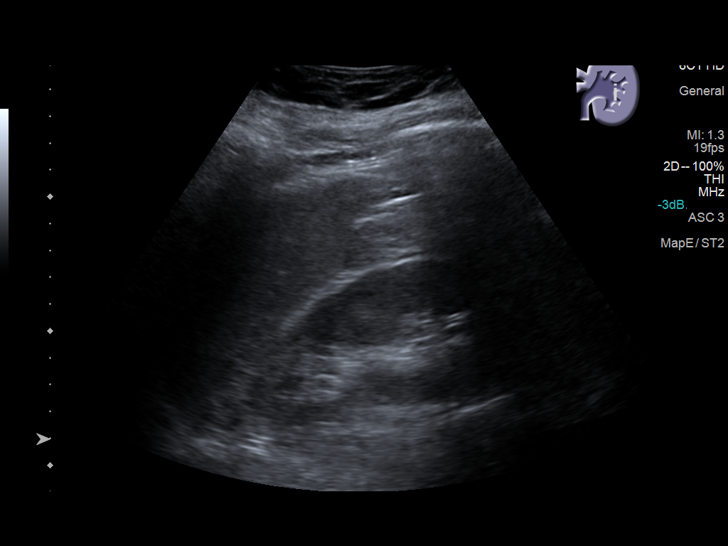
[im 16/29]
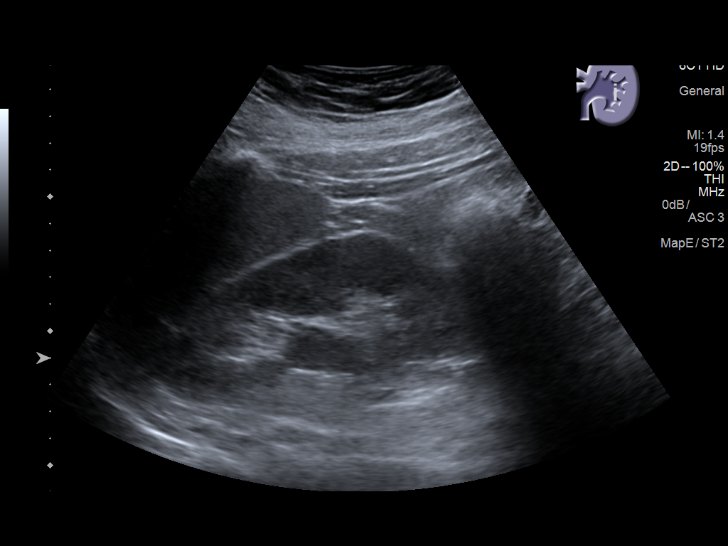
[im 18/29]
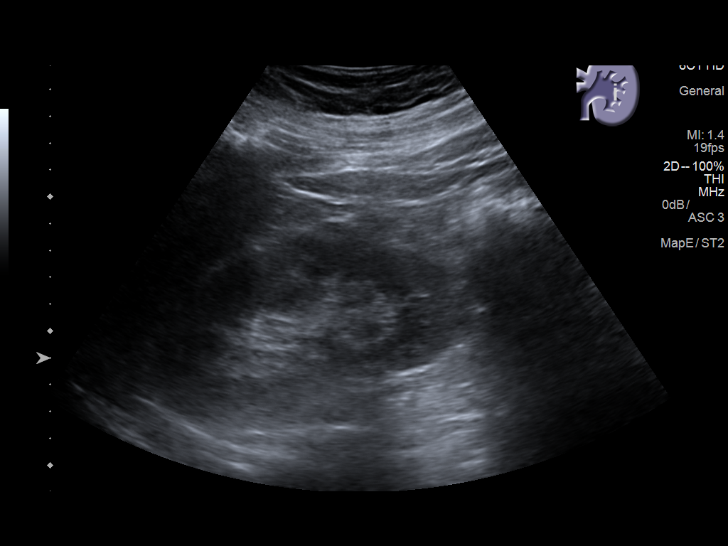
[im 19/29]
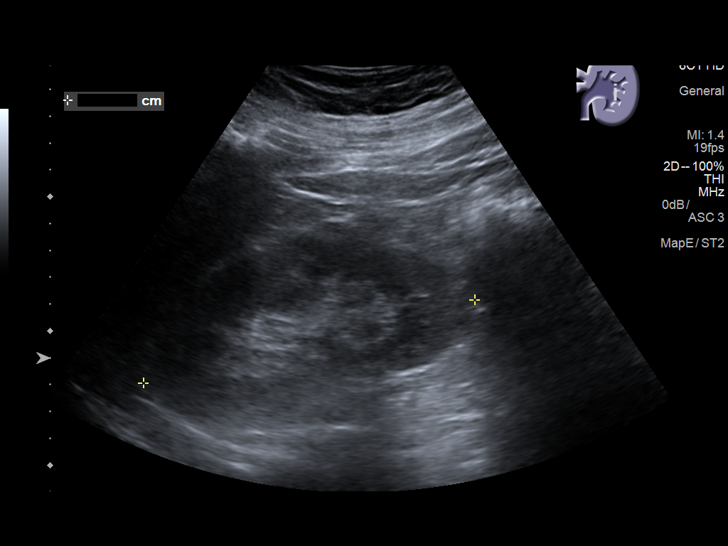
[im 22/29]
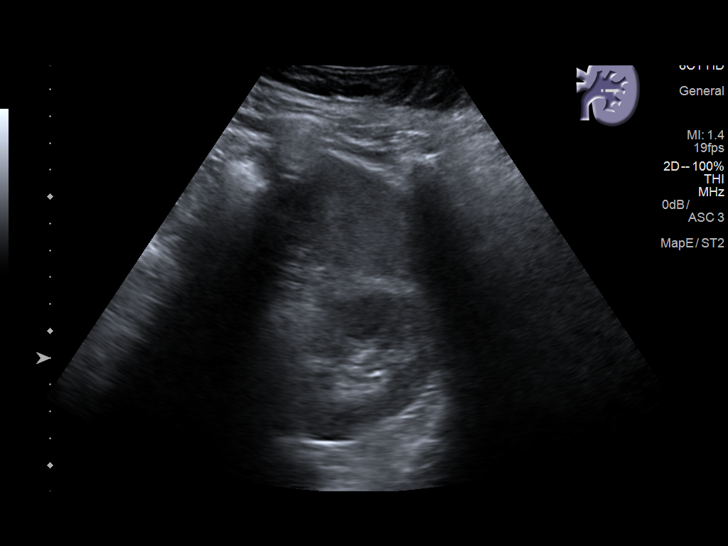
[im 24/29]
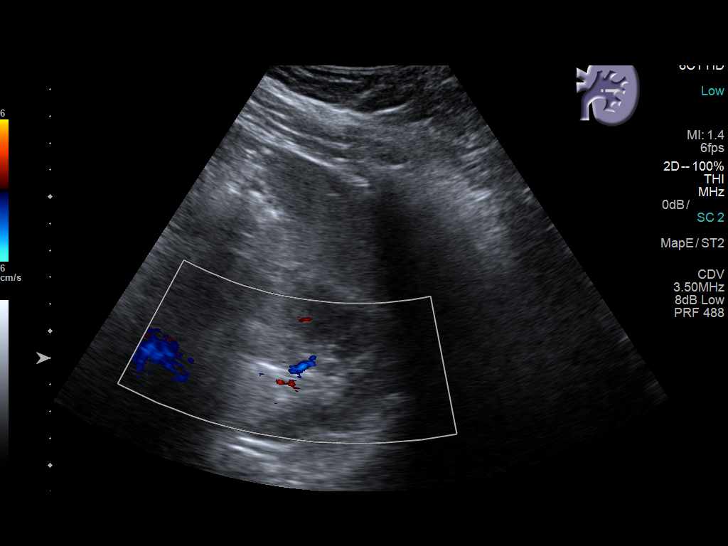
[im 26/29]
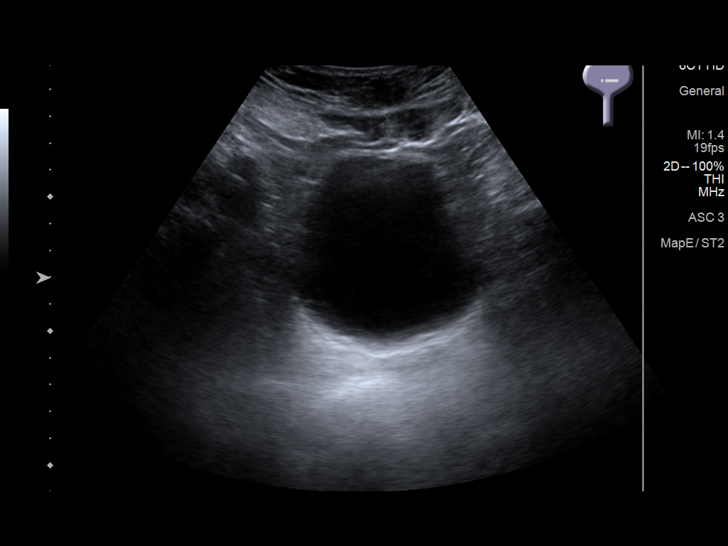
[im 29/29]
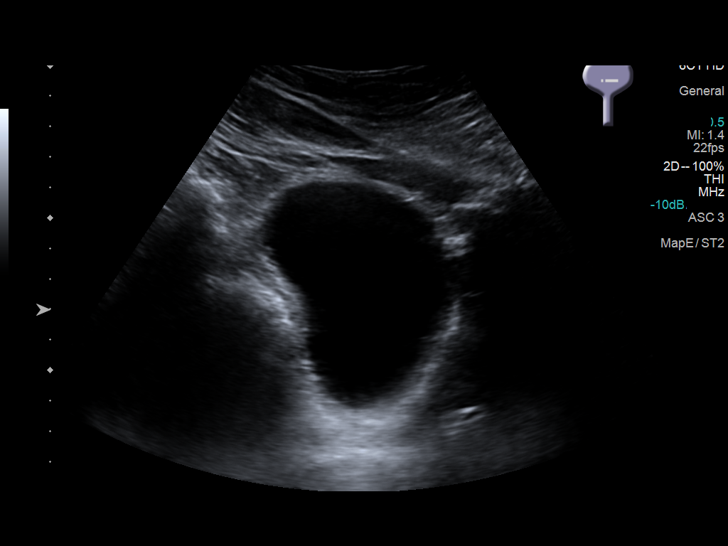

[14 of 25 positions shown; findings below may reference images not displayed]

FINDINGS: Right Kidney:

Length: 12.5 cm. Echogenicity within normal limits. No mass or
hydronephrosis visualized.

Left Kidney:

Length: 12.8 cm. Echogenicity within normal limits. No mass or
hydronephrosis visualized.

Bladder:

Appears normal for degree of bladder distention.
IMPRESSION: 1. Normal renal sonogram.

## 2020-04-21 IMAGING — CT CT CERVICAL SPINE W/O CM
4 of 8 series · 10 of 33 positions shown, 11 images · non-contrast
Comparison: CT head 12/24/2015.

CLINICAL DATA: Patient was in bed and had seizures. Patient has
known seizure disorder. Evaluate for intracranial abnormality or
cervical spine ligamentous injury.

EXAM:
CT HEAD WITHOUT CONTRAST
CT CERVICAL SPINE WITHOUT CONTRAST
TECHNIQUE: Multidetector CT imaging of the head and cervical spine was
performed following the standard protocol without intravenous
contrast. Multiplanar CT image reconstructions of the cervical spine
were also generated.

[Series 5: orthogonal bone · axial · 0.23mm/px · z∈[-246,-175]mm · 2 of 113 slices shown, 3 images (1 of 2)]
[im 38/113  soft-tissue]
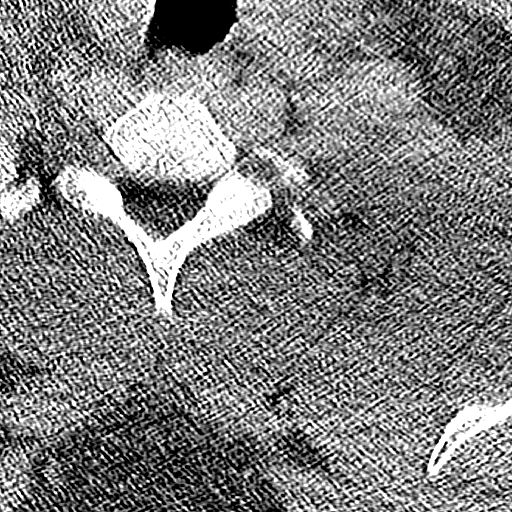
[im 38/113  bone]
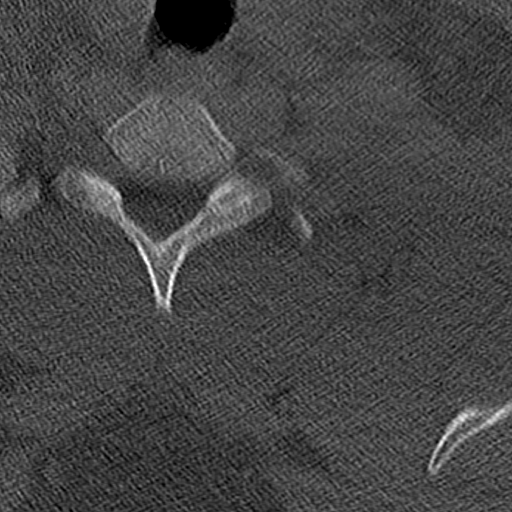
[im 75/113  bone]
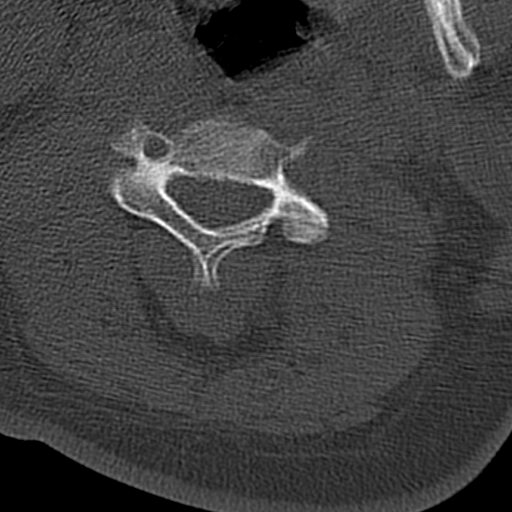

[Series 6: coronal bone · coronal · 0.27mm/px · 1 of 61 slices shown]
[im 31/61  bone]
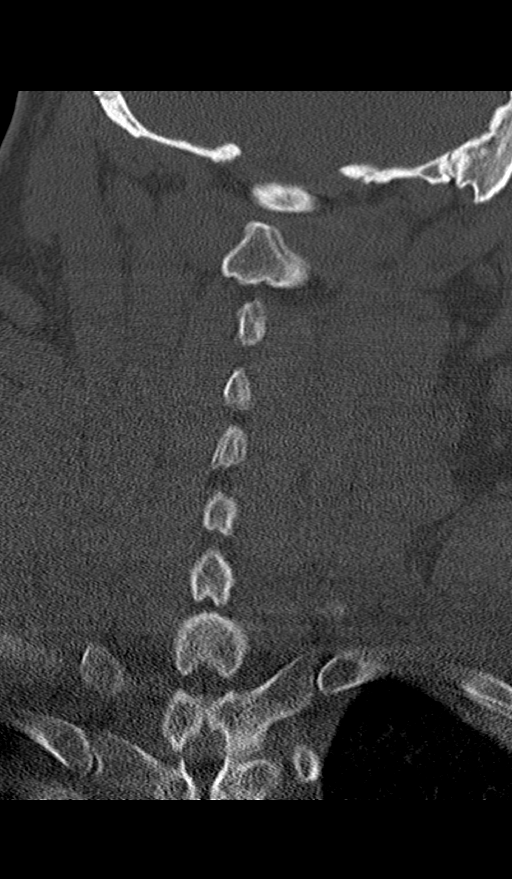

[Series 7: sagittal bone · sagittal · 0.27mm/px · 5 of 61 slices shown]
[im 11/61  bone]
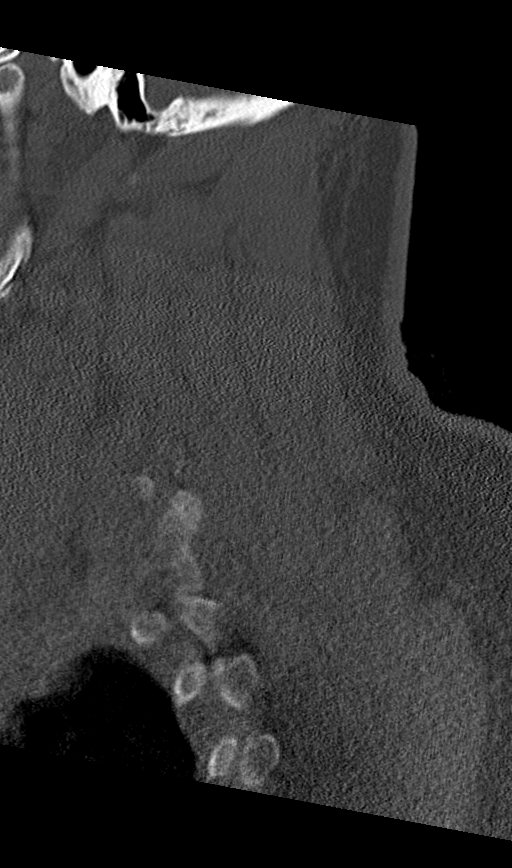
[im 21/61  bone]
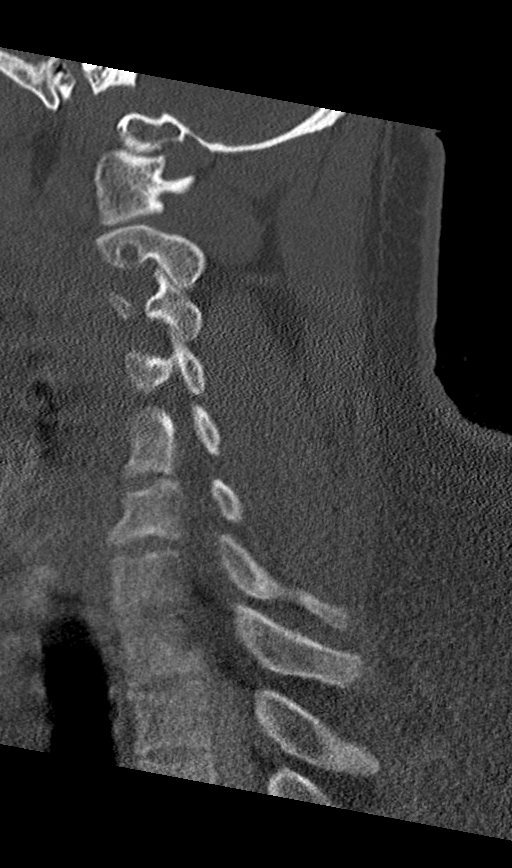
[im 31/61  bone]
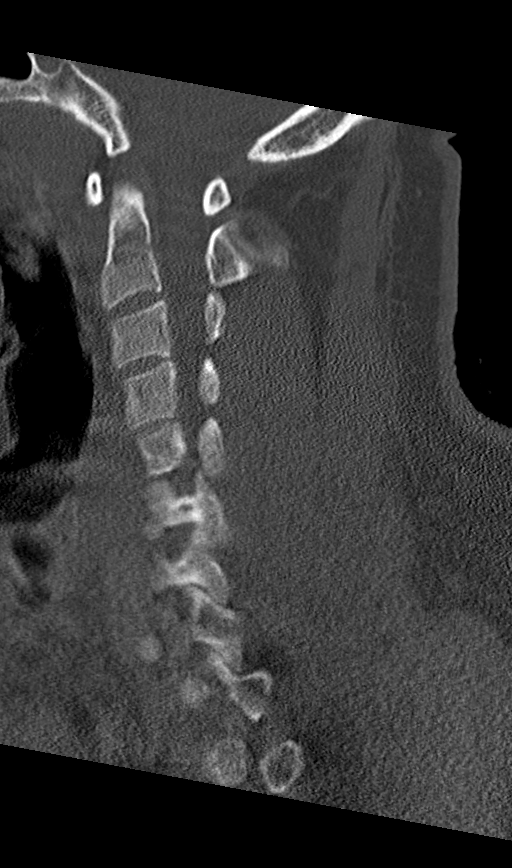
[im 41/61  bone]
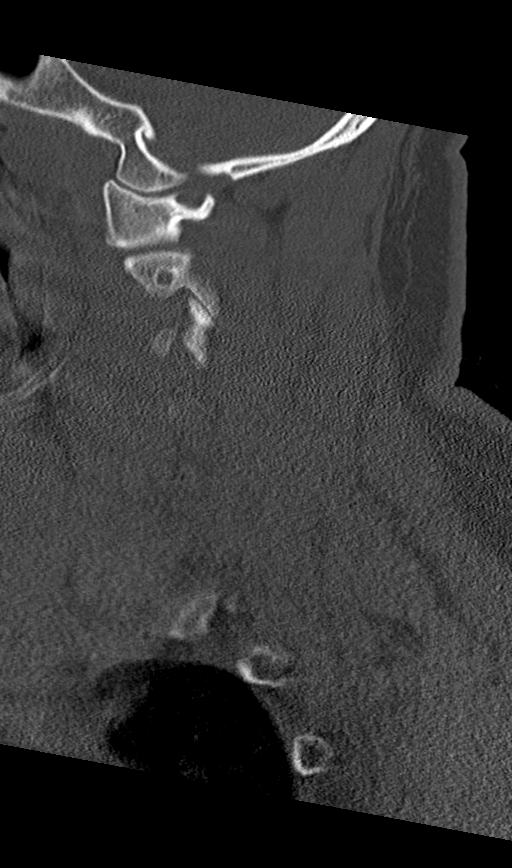
[im 51/61  bone]
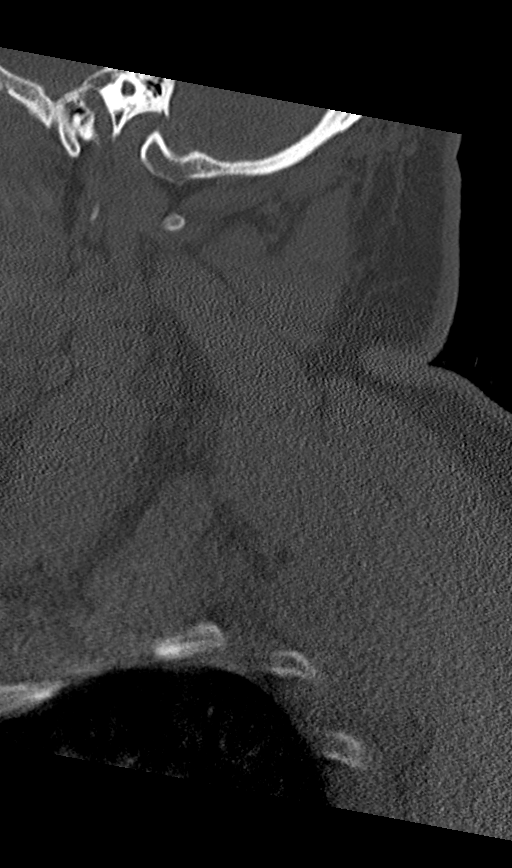

[Series 14: orthogonal bone · axial · 0.23mm/px · z∈[-240,-171]mm · 2 of 109 slices shown (2 of 2)]
[im 37/109  bone]
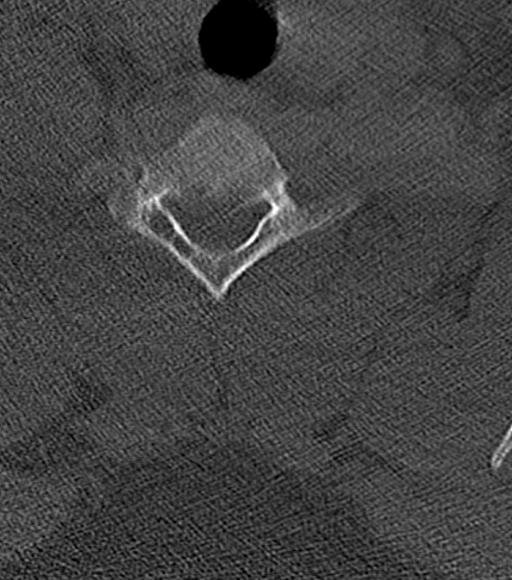
[im 73/109  bone]
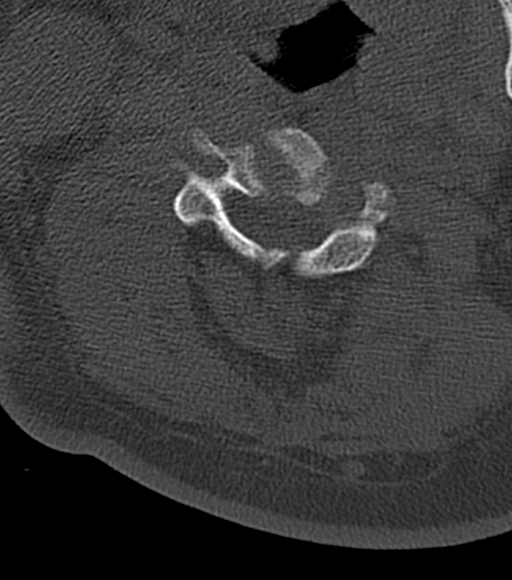

[10 of 33 positions shown; findings below may reference images not displayed]

FINDINGS: The patient was unable to remain motionless for the exam. Small or
subtle lesions could be overlooked.

CT HEAD FINDINGS

Brain: No evidence for acute infarction, hemorrhage, mass lesion,
hydrocephalus, or extra-axial fluid. Normal for age cerebral volume.
No definite white matter disease.

Vascular: No hyperdense vessel or unexpected calcification.

Skull: Normal. Negative for fracture or focal lesion.

Sinuses/Orbits: No acute finding.

Other: Compared with prior head CT, no change.

CT CERVICAL SPINE FINDINGS

Significant motion degradation.

Alignment: Straightening of the normal cervical lordosis.

Skull base and vertebrae: No definite fracture on this motion
degraded scan.

Soft tissues and spinal canal: No definite prevertebral fluid.
Cannot assess for canal hematoma.

Disc levels:  Intervertebral disc spaces are preserved.

Upper chest: Respiratory motion. No visible pneumothorax or large
mass.

Other: None.
IMPRESSION: Unremarkable CT head without contrast. No skull fracture,
intracranial hemorrhage, or visible mass lesion on this noncontrast
exam.

Motion degraded cervical spine CT demonstrating no gross fracture or
subluxation.
